# Patient Record
Sex: Male | Born: 1955 | Race: Black or African American | Hispanic: No | Smoking: Never smoker
Health system: Southern US, Community
[De-identification: ages and names within clinical notes are randomized; demographics above are authoritative.]

## PROBLEM LIST (undated history)

## (undated) DIAGNOSIS — Z8739 Personal history of other diseases of the musculoskeletal system and connective tissue: Secondary | ICD-10-CM

## (undated) DIAGNOSIS — N451 Epididymitis: Secondary | ICD-10-CM

## (undated) DIAGNOSIS — H9190 Unspecified hearing loss, unspecified ear: Secondary | ICD-10-CM

## (undated) DIAGNOSIS — Z86010 Personal history of colonic polyps: Principal | ICD-10-CM

## (undated) DIAGNOSIS — M109 Gout, unspecified: Secondary | ICD-10-CM

## (undated) DIAGNOSIS — N35919 Unspecified urethral stricture, male, unspecified site: Secondary | ICD-10-CM

## (undated) DIAGNOSIS — N452 Orchitis: Secondary | ICD-10-CM

## (undated) DIAGNOSIS — I10 Essential (primary) hypertension: Secondary | ICD-10-CM

## (undated) DIAGNOSIS — E785 Hyperlipidemia, unspecified: Secondary | ICD-10-CM

## (undated) DIAGNOSIS — K219 Gastro-esophageal reflux disease without esophagitis: Secondary | ICD-10-CM

## (undated) DIAGNOSIS — A419 Sepsis, unspecified organism: Secondary | ICD-10-CM

## (undated) HISTORY — DX: Unspecified hearing loss, unspecified ear: H91.90

## (undated) HISTORY — DX: Orchitis: N45.2

## (undated) HISTORY — DX: Gastro-esophageal reflux disease without esophagitis: K21.9

## (undated) HISTORY — PX: POLYPECTOMY: SHX149

## (undated) HISTORY — DX: Gout, unspecified: M10.9

## (undated) HISTORY — DX: Personal history of colonic polyps: Z86.010

## (undated) HISTORY — PX: WISDOM TOOTH EXTRACTION: SHX21

## (undated) HISTORY — DX: Hyperlipidemia, unspecified: E78.5

## (undated) HISTORY — DX: Unspecified urethral stricture, male, unspecified site: N35.919

## (undated) HISTORY — PX: INGUINAL HERNIA REPAIR: SUR1180

## (undated) HISTORY — PX: COLONOSCOPY: SHX174

## (undated) HISTORY — DX: Epididymitis: N45.1

## (undated) HISTORY — PX: UPPER GASTROINTESTINAL ENDOSCOPY: SHX188

---

## 2004-06-27 ENCOUNTER — Emergency Department (HOSPITAL_COMMUNITY): Admission: EM | Admit: 2004-06-27 | Discharge: 2004-06-27 | Payer: Self-pay | Admitting: Family Medicine

## 2004-07-04 ENCOUNTER — Emergency Department (HOSPITAL_COMMUNITY): Admission: EM | Admit: 2004-07-04 | Discharge: 2004-07-05 | Payer: Self-pay

## 2005-06-17 ENCOUNTER — Emergency Department (HOSPITAL_COMMUNITY): Admission: EM | Admit: 2005-06-17 | Discharge: 2005-06-17 | Payer: Self-pay | Admitting: Family Medicine

## 2005-12-02 ENCOUNTER — Emergency Department (HOSPITAL_COMMUNITY): Admission: EM | Admit: 2005-12-02 | Discharge: 2005-12-02 | Payer: Self-pay | Admitting: Pediatrics

## 2005-12-12 ENCOUNTER — Ambulatory Visit (HOSPITAL_COMMUNITY): Admission: RE | Admit: 2005-12-12 | Discharge: 2005-12-12 | Payer: Self-pay | Admitting: General Surgery

## 2005-12-12 ENCOUNTER — Encounter (INDEPENDENT_AMBULATORY_CARE_PROVIDER_SITE_OTHER): Payer: Self-pay | Admitting: Specialist

## 2007-08-19 ENCOUNTER — Emergency Department (HOSPITAL_COMMUNITY): Admission: EM | Admit: 2007-08-19 | Discharge: 2007-08-20 | Payer: Self-pay | Admitting: Emergency Medicine

## 2009-11-21 ENCOUNTER — Emergency Department (HOSPITAL_COMMUNITY): Admission: EM | Admit: 2009-11-21 | Discharge: 2009-11-21 | Payer: Self-pay | Admitting: Family Medicine

## 2010-04-03 ENCOUNTER — Emergency Department (HOSPITAL_COMMUNITY): Admission: EM | Admit: 2010-04-03 | Discharge: 2010-04-03 | Payer: Self-pay | Admitting: Emergency Medicine

## 2010-05-04 ENCOUNTER — Inpatient Hospital Stay (HOSPITAL_COMMUNITY): Admission: EM | Admit: 2010-05-04 | Discharge: 2010-05-07 | Payer: Self-pay | Admitting: Emergency Medicine

## 2010-05-09 ENCOUNTER — Emergency Department (HOSPITAL_COMMUNITY): Admission: EM | Admit: 2010-05-09 | Discharge: 2010-05-09 | Payer: Self-pay | Admitting: Emergency Medicine

## 2010-05-11 ENCOUNTER — Emergency Department (HOSPITAL_COMMUNITY): Admission: EM | Admit: 2010-05-11 | Discharge: 2010-05-11 | Payer: Self-pay | Admitting: Family Medicine

## 2010-06-01 ENCOUNTER — Ambulatory Visit: Payer: Self-pay | Admitting: Internal Medicine

## 2010-07-10 ENCOUNTER — Emergency Department (HOSPITAL_COMMUNITY): Admission: EM | Admit: 2010-07-10 | Discharge: 2010-07-10 | Payer: Self-pay | Admitting: Family Medicine

## 2010-10-24 NOTE — Assessment & Plan Note (Signed)
Summary: BRAND NEW PT/TO EST/CJR   Vital Signs:  Patient profile:   55 year old male Height:      69 inches Weight:      218 pounds BMI:     32.31 Temp:     98.2 degrees F oral Pulse rate:   78 / minute Pulse rhythm:   regular Resp:     18 per minute BP sitting:   120 / 78  (right arm) Cuff size:   regular  Vitals Entered By: Duard Brady LPN (June 01, 2010 9:14 AM) CC: new to establish Is Patient Diabetic? No   CC:  new to establish.  History of Present Illness: 55 year old patient who is seen today to establish  with our practice.  he was hospitalized last month for urosepsis following a urological procedure.  It sounds like he had a urethral stricture, dilated.  Since his hospital discharge.  There has been some concern about hypertension.  His RN  wife has been tracking.  Blood pressures, which are occasionally in the stage II, blood pressure range.  He is very sedentary  Preventive Screening-Counseling & Management  Alcohol-Tobacco     Smoking Status: never  Caffeine-Diet-Exercise     Does Patient Exercise: no  Allergies (verified): No Known Drug Allergies  Past History:  Past Medical History: hypertension.  Suspect history of urosepsis history of urethral stricture  Past Surgical History: right angle, hernia repair 2005 status post dilatation of a urethral stricture August 2011  Family History: Reviewed history and no changes required. father died age 24, complications of a wound infection related to a gunshot wound mother, age 81, has a history of cardiac disease and is status post pacemaker insertion 4 brothers, one with a history of hypertension 4 sisters, one with a history of cancer, unclear type  Social History: Reviewed history and no changes required. Occupation:  truck [employer] doctor Married Never Smoked Regular exercise-no Smoking Status:  never Does Patient Exercise:  no  Review of Systems  The patient denies anorexia, fever,  weight loss, weight gain, vision loss, decreased hearing, hoarseness, chest pain, syncope, dyspnea on exertion, peripheral edema, prolonged cough, headaches, hemoptysis, abdominal pain, melena, hematochezia, severe indigestion/heartburn, hematuria, incontinence, genital sores, muscle weakness, suspicious skin lesions, transient blindness, difficulty walking, depression, unusual weight change, abnormal bleeding, enlarged lymph nodes, angioedema, breast masses, and testicular masses.    Physical Exam  General:  overweight-appearing.  120/84 Head:  Normocephalic and atraumatic without obvious abnormalities. No apparent alopecia or balding. Eyes:  No corneal or conjunctival inflammation noted. EOMI. Perrla. Funduscopic exam benign, without hemorrhages, exudates or papilledema. Vision grossly normal. Ears:  External ear exam shows no significant lesions or deformities.  Otoscopic examination reveals clear canals, tympanic membranes are intact bilaterally without bulging, retraction, inflammation or discharge. Hearing is grossly normal bilaterally. Nose:  External nasal examination shows no deformity or inflammation. Nasal mucosa are pink and moist without lesions or exudates. Mouth:  Oral mucosa and oropharynx without lesions or exudates.  Teeth in good repair. Neck:  No deformities, masses, or tenderness noted. Chest Wall:  No deformities, masses, tenderness or gynecomastia noted. Breasts:  No masses or gynecomastia noted Lungs:  Normal respiratory effort, chest expands symmetrically. Lungs are clear to auscultation, no crackles or wheezes. Heart:  Normal rate and regular rhythm. S1 and S2 normal without gallop, murmur, click, rub or other extra sounds. Abdomen:  Bowel sounds positive,abdomen soft and non-tender without masses, organomegaly or hernias noted. Genitalia:  Testes bilaterally descended without  nodularity, tenderness or masses. No scrotal masses or lesions. No penis lesions or urethral  discharge. Msk:  No deformity or scoliosis noted of thoracic or lumbar spine.   Pulses:  R and L carotid,radial,femoral,dorsalis pedis and posterior tibial pulses are full and equal bilaterally Extremities:  No clubbing, cyanosis, edema, or deformity noted with normal full range of motion of all joints.   Neurologic:  alert & oriented X3, cranial nerves II-XII intact, and gait normal.   Skin:  Intact without suspicious lesions or rashes Cervical Nodes:  No lymphadenopathy noted Axillary Nodes:  No palpable lymphadenopathy Inguinal Nodes:  No significant adenopathy Psych:  Cognition and judgment appear intact. Alert and cooperative with normal attention span and concentration. No apparent delusions, illusions, hallucinations   Impression & Recommendations:  Problem # 1:  Preventive Health Care (ICD-V70.0)  Problem # 2:  ELEVATED BLOOD PRESSURE WITHOUT DIAGNOSIS OF HYPERTENSION (ICD-796.2)  Patient Instructions: 1)  Please schedule a follow-up appointment in 1 month. 2)  Limit your Sodium (Salt). 3)  It is important that you exercise regularly at least 20 minutes 5 times a week. If you develop chest pain, have severe difficulty breathing, or feel very tired , stop exercising immediately and seek medical attention. 4)  You need to lose weight. Consider a lower calorie diet and regular exercise.  5)  Check your Blood Pressure regularly. If it is above: 150/90, you should make an appointment.

## 2010-12-07 LAB — URINALYSIS, ROUTINE W REFLEX MICROSCOPIC
Bilirubin Urine: NEGATIVE
Bilirubin Urine: NEGATIVE
Glucose, UA: NEGATIVE mg/dL
Glucose, UA: NEGATIVE mg/dL
Ketones, ur: NEGATIVE mg/dL
Ketones, ur: NEGATIVE mg/dL
Nitrite: NEGATIVE
Nitrite: NEGATIVE
Protein, ur: NEGATIVE mg/dL
Protein, ur: NEGATIVE mg/dL
Specific Gravity, Urine: 1.017 (ref 1.005–1.030)
Specific Gravity, Urine: 1.018 (ref 1.005–1.030)
Urobilinogen, UA: 1 mg/dL (ref 0.0–1.0)
Urobilinogen, UA: 1 mg/dL (ref 0.0–1.0)
pH: 6.5 (ref 5.0–8.0)
pH: 6.5 (ref 5.0–8.0)

## 2010-12-07 LAB — CBC
HCT: 35.5 % — ABNORMAL LOW (ref 39.0–52.0)
HCT: 37.3 % — ABNORMAL LOW (ref 39.0–52.0)
HCT: 37.9 % — ABNORMAL LOW (ref 39.0–52.0)
HCT: 37.9 % — ABNORMAL LOW (ref 39.0–52.0)
HCT: 40.1 % (ref 39.0–52.0)
Hemoglobin: 12.1 g/dL — ABNORMAL LOW (ref 13.0–17.0)
Hemoglobin: 12.7 g/dL — ABNORMAL LOW (ref 13.0–17.0)
Hemoglobin: 12.8 g/dL — ABNORMAL LOW (ref 13.0–17.0)
Hemoglobin: 13.1 g/dL (ref 13.0–17.0)
Hemoglobin: 13.7 g/dL (ref 13.0–17.0)
MCH: 28.6 pg (ref 26.0–34.0)
MCH: 28.9 pg (ref 26.0–34.0)
MCH: 29 pg (ref 26.0–34.0)
MCH: 29.2 pg (ref 26.0–34.0)
MCHC: 33.7 g/dL (ref 30.0–36.0)
MCHC: 34.2 g/dL (ref 30.0–36.0)
MCHC: 34.3 g/dL (ref 30.0–36.0)
MCHC: 34.3 g/dL (ref 30.0–36.0)
MCHC: 34.7 g/dL (ref 30.0–36.0)
MCV: 84.2 fL (ref 78.0–100.0)
MCV: 84.4 fL (ref 78.0–100.0)
MCV: 84.7 fL (ref 78.0–100.0)
MCV: 85 fL (ref 78.0–100.0)
Platelets: 100 10*3/uL — ABNORMAL LOW (ref 150–400)
Platelets: 102 10*3/uL — ABNORMAL LOW (ref 150–400)
Platelets: 108 10*3/uL — ABNORMAL LOW (ref 150–400)
Platelets: 93 10*3/uL — ABNORMAL LOW (ref 150–400)
RBC: 4.2 MIL/uL — ABNORMAL LOW (ref 4.22–5.81)
RBC: 4.42 MIL/uL (ref 4.22–5.81)
RBC: 4.46 MIL/uL (ref 4.22–5.81)
RBC: 4.5 MIL/uL (ref 4.22–5.81)
RBC: 4.73 MIL/uL (ref 4.22–5.81)
RDW: 14.1 % (ref 11.5–15.5)
RDW: 14.2 % (ref 11.5–15.5)
RDW: 14.4 % (ref 11.5–15.5)
RDW: 14.5 % (ref 11.5–15.5)
WBC: 2.9 10*3/uL — ABNORMAL LOW (ref 4.0–10.5)
WBC: 5.4 10*3/uL (ref 4.0–10.5)
WBC: 7.5 10*3/uL (ref 4.0–10.5)
WBC: 9 10*3/uL (ref 4.0–10.5)

## 2010-12-07 LAB — BASIC METABOLIC PANEL
BUN: 13 mg/dL (ref 6–23)
CO2: 23 mEq/L (ref 19–32)
Calcium: 8.5 mg/dL (ref 8.4–10.5)
Chloride: 108 mEq/L (ref 96–112)
Creatinine, Ser: 1.39 mg/dL (ref 0.4–1.5)
GFR calc Af Amer: 59 mL/min — ABNORMAL LOW (ref >60)
GFR calc Af Amer: >60 mL/min (ref >60)
GFR calc non Af Amer: 48 mL/min — ABNORMAL LOW (ref >60)
GFR calc non Af Amer: 54 mL/min — ABNORMAL LOW (ref >60)
Glucose, Bld: 114 mg/dL — ABNORMAL HIGH (ref 70–99)
Glucose, Bld: 95 mg/dL (ref 70–99)
Potassium: 3.6 mEq/L (ref 3.5–5.1)
Potassium: 3.8 mEq/L (ref 3.5–5.1)
Sodium: 136 mEq/L (ref 135–145)
Sodium: 139 mEq/L (ref 135–145)

## 2010-12-07 LAB — COMPREHENSIVE METABOLIC PANEL
ALT: 36 U/L (ref 0–53)
ALT: 42 U/L (ref 0–53)
ALT: 43 U/L (ref 0–53)
AST: 30 U/L (ref 0–37)
AST: 32 U/L (ref 0–37)
AST: 33 U/L (ref 0–37)
Albumin: 3.2 g/dL — ABNORMAL LOW (ref 3.5–5.2)
Albumin: 3.5 g/dL (ref 3.5–5.2)
Albumin: 3.6 g/dL (ref 3.5–5.2)
Alkaline Phosphatase: 27 U/L — ABNORMAL LOW (ref 39–117)
Alkaline Phosphatase: 29 U/L — ABNORMAL LOW (ref 39–117)
Alkaline Phosphatase: 30 U/L — ABNORMAL LOW (ref 39–117)
BUN: 12 mg/dL (ref 6–23)
BUN: 13 mg/dL (ref 6–23)
BUN: 7 mg/dL (ref 6–23)
CO2: 25 mEq/L (ref 19–32)
CO2: 26 mEq/L (ref 19–32)
CO2: 27 mEq/L (ref 19–32)
Calcium: 8.2 mg/dL — ABNORMAL LOW (ref 8.4–10.5)
Calcium: 8.5 mg/dL (ref 8.4–10.5)
Calcium: 8.5 mg/dL (ref 8.4–10.5)
Chloride: 105 mEq/L (ref 96–112)
Chloride: 106 mEq/L (ref 96–112)
Chloride: 109 mEq/L (ref 96–112)
Creatinine, Ser: 1.38 mg/dL (ref 0.4–1.5)
Creatinine, Ser: 1.46 mg/dL (ref 0.4–1.5)
Creatinine, Ser: 1.53 mg/dL — ABNORMAL HIGH (ref 0.4–1.5)
GFR calc Af Amer: 58 mL/min — ABNORMAL LOW (ref >60)
GFR calc Af Amer: >60 mL/min (ref >60)
GFR calc Af Amer: >60 mL/min (ref >60)
GFR calc non Af Amer: 48 mL/min — ABNORMAL LOW (ref >60)
GFR calc non Af Amer: 51 mL/min — ABNORMAL LOW (ref >60)
GFR calc non Af Amer: 54 mL/min — ABNORMAL LOW (ref >60)
Glucose, Bld: 105 mg/dL — ABNORMAL HIGH (ref 70–99)
Glucose, Bld: 86 mg/dL (ref 70–99)
Glucose, Bld: 94 mg/dL (ref 70–99)
Potassium: 3.6 mEq/L (ref 3.5–5.1)
Potassium: 4 mEq/L (ref 3.5–5.1)
Potassium: 4.1 mEq/L (ref 3.5–5.1)
Sodium: 138 mEq/L (ref 135–145)
Sodium: 139 mEq/L (ref 135–145)
Sodium: 139 mEq/L (ref 135–145)
Total Bilirubin: 0.6 mg/dL (ref 0.3–1.2)
Total Bilirubin: 0.8 mg/dL (ref 0.3–1.2)
Total Bilirubin: 1.1 mg/dL (ref 0.3–1.2)
Total Protein: 6.7 g/dL (ref 6.0–8.3)
Total Protein: 6.8 g/dL (ref 6.0–8.3)
Total Protein: 7 g/dL (ref 6.0–8.3)

## 2010-12-07 LAB — URINE CULTURE
Colony Count: 50000
Culture  Setup Time: 201108130122
Culture: NO GROWTH
Special Requests: NEGATIVE

## 2010-12-07 LAB — URINE MICROSCOPIC-ADD ON

## 2010-12-07 LAB — DIFFERENTIAL
Basophils Absolute: 0 10*3/uL (ref 0.0–0.1)
Basophils Absolute: 0 10*3/uL (ref 0.0–0.1)
Basophils Relative: 0 % (ref 0–1)
Basophils Relative: 0 % (ref 0–1)
Eosinophils Absolute: 0 10*3/uL (ref 0.0–0.7)
Eosinophils Absolute: 0 10*3/uL (ref 0.0–0.7)
Eosinophils Relative: 0 % (ref 0–5)
Eosinophils Relative: 1 % (ref 0–5)
Lymphocytes Relative: 16 % (ref 12–46)
Lymphocytes Relative: 5 % — ABNORMAL LOW (ref 12–46)
Lymphs Abs: 0.4 10*3/uL — ABNORMAL LOW (ref 0.7–4.0)
Lymphs Abs: 0.9 10*3/uL (ref 0.7–4.0)
Monocytes Absolute: 0.1 10*3/uL (ref 0.1–1.0)
Monocytes Absolute: 0.2 10*3/uL (ref 0.1–1.0)
Monocytes Relative: 1 % — ABNORMAL LOW (ref 3–12)
Monocytes Relative: 2 % — ABNORMAL LOW (ref 3–12)
Neutro Abs: 4.4 10*3/uL (ref 1.7–7.7)
Neutro Abs: 8.4 10*3/uL — ABNORMAL HIGH (ref 1.7–7.7)
Neutrophils Relative %: 81 % — ABNORMAL HIGH (ref 43–77)
Neutrophils Relative %: 93 % — ABNORMAL HIGH (ref 43–77)

## 2010-12-07 LAB — CULTURE, BLOOD (ROUTINE X 2)
Culture: NO GROWTH
Culture: NO GROWTH
Culture: NO GROWTH
Culture: NO GROWTH

## 2010-12-07 LAB — NA AND K (SODIUM & POTASSIUM), RAND UR: Potassium Urine: 10 mEq/L

## 2010-12-07 LAB — LACTATE DEHYDROGENASE: LDH: 168 U/L (ref 94–250)

## 2010-12-09 LAB — URINALYSIS, ROUTINE W REFLEX MICROSCOPIC
Bilirubin Urine: NEGATIVE
Nitrite: POSITIVE — AB
Specific Gravity, Urine: 1.023 (ref 1.005–1.030)
Urobilinogen, UA: 0.2 mg/dL (ref 0.0–1.0)
pH: 6 (ref 5.0–8.0)

## 2010-12-09 LAB — URINE MICROSCOPIC-ADD ON

## 2010-12-09 LAB — URINE CULTURE

## 2010-12-16 LAB — URINE CULTURE

## 2010-12-16 LAB — POCT URINALYSIS DIP (DEVICE)
Bilirubin Urine: NEGATIVE
Glucose, UA: NEGATIVE mg/dL
Ketones, ur: NEGATIVE mg/dL
Specific Gravity, Urine: 1.02 (ref 1.005–1.030)

## 2011-02-08 NOTE — Op Note (Signed)
[name], [name]                ACCOUNT NO.:  [id number]   MEDICAL RECORD NO.:  [id number]          PATIENT TYPE:  AMB   LOCATION:  SDS                          FACILITY:  [facility]   PHYSICIAN:  Leonie Man, M.D.   DATE OF BIRTH:  July 01, 1956   DATE OF PROCEDURE:  12/12/2005  DATE OF DISCHARGE:  12/12/2005                                 OPERATIVE REPORT   PREOPERATIVE DIAGNOSES:  1.  Right inguinal hernia.  2.  Lipoma, right groin.   POSTOPERATIVE DIAGNOSES:  1.  Right inguinal hernia.  2.  Lipoma, right groin.   PROCEDURES:  1.  Repair of right inguinal hernia with mesh.  2.  Excision of lipoma, right groin.   SURGEON:  Leonie Man, M.D.   ASSISTANT:  R.N.F.A.   ANESTHESIA:  General.   NOTE:  The patient is a 55 year old man presenting with right-sided groin  pain and a right-sided groin mass.  He on examination noted to have what  feels like a lipoma in the most proximal part of his scrotum and groin as  well as a hernia protruding through the external ring.  He comes to the  operating room after the risks and potential benefits of surgery have been  discussed, all questions answered and consent obtained.   PROCEDURE:  Following induction of satisfactory general anesthesia, the  patient positioned supinely, the abdomen is prepped and draped to be  included in a sterile operative field.  A transverse incision was made in  the lower abdominal crease, deepened through the skin and subcutaneous  tissues, carrying the dissection down to the external oblique aponeurosis.  External oblique aponeurosis opened up through the external inguinal ring to  reveal rather large direct and indirect inguinal hernias.  The spermatic  cord was elevated and held with a Penrose drain.  The indirect hernia was  primarily that of a large lipoma, which was dissected free from the  spermatic cor, carrying the dissection up to the internal ring.  The  attachments of the lipoma to the  retroperitoneal space were then suture  ligated with  2-0 silk sutures and then the cord lipoma removed and  forwarded for pathologic evaluation.  On evaluation of the direct space,  there was also a direct hernia protruding through the transversalis fascia,  right at the internal ring.  This was then mobilized and the superior  epigastric vessels held with a with an Allis clamp and dissection carried  down into the retroperitoneum to make space for the Prolene hernia mesh  system.  The Prolene hernia mesh system was then grasped and deployed into  the retroperitoneal space, covering superiorly, inferiorly, laterally and  medially to cover the entire direct space as well as the femoral triangle.  The external portion of the mesh was then deployed into the inguinal canal  and sutured in at the pubic tubercle and carried up along the conjoined  tendon to the internal ring.  The mesh was split so as to allow protrusion  of the cord through the mesh and then sutured again from the pubic tubercle,  coming along the shelving edge of Poupart's ligament to the internal ring.  The remainder of the portion of the mesh was deployed over the internal  oblique muscles.  The hernia repair was noted to be intact.  The external  oblique aponeurosis was then closed with a running suture of 2-0 Vicryl.  Dissection was then carried down into the lateral aspect of the groin and  scrotum to deliver a very large, approximately 8 cm lipoma from the soft  tissues of the groin.  This was dissected free, removed and forwarded for  pathologic evaluation.  Hemostasis obtained with electrocautery and the  defect closed with 3-0 Vicryl sutures.  Subcutaneous tissue then closed with  3-0 Vicryl sutures.  Skin closed with a 4-0 Monocryl suture and then  reinforced with Steri-Strips.  Sterile dressings applied, the anesthetic  reversed and the patient removed from the operating room to the recovery  room in stable  condition.  He tolerated the procedure well.      Leonie Man, M.D.  Electronically Signed     PB/MEDQ  D:  12/12/2005  T:  12/14/2005  Job:  [id number]

## 2011-07-02 LAB — URINALYSIS, ROUTINE W REFLEX MICROSCOPIC
Bilirubin Urine: NEGATIVE
Ketones, ur: NEGATIVE
Nitrite: NEGATIVE
Protein, ur: NEGATIVE
pH: 7

## 2011-07-02 LAB — URINE MICROSCOPIC-ADD ON

## 2011-08-03 ENCOUNTER — Encounter: Payer: Self-pay | Admitting: *Deleted

## 2011-08-03 ENCOUNTER — Emergency Department (HOSPITAL_COMMUNITY)
Admission: EM | Admit: 2011-08-03 | Discharge: 2011-08-04 | Disposition: A | Discharge status: Home / Self Care | Payer: Self-pay | Attending: Emergency Medicine | Admitting: Emergency Medicine

## 2011-08-03 DIAGNOSIS — L539 Erythematous condition, unspecified: Secondary | ICD-10-CM | POA: Insufficient documentation

## 2011-08-03 DIAGNOSIS — M79609 Pain in unspecified limb: Secondary | ICD-10-CM | POA: Insufficient documentation

## 2011-08-03 DIAGNOSIS — M109 Gout, unspecified: Secondary | ICD-10-CM | POA: Insufficient documentation

## 2011-08-03 MED ORDER — PREDNISONE 50 MG PO TABS: ORAL_TABLET | ORAL | Status: AC

## 2011-08-03 MED ORDER — PREDNISONE 20 MG PO TABS
60.0000 mg | ORAL_TABLET | Freq: Once | ORAL | Status: AC
  Administered 2011-08-04: 60 mg via ORAL
  Filled 2011-08-03: qty 3

## 2011-08-03 MED ORDER — IBUPROFEN 800 MG PO TABS: 800.0000 mg | ORAL_TABLET | Freq: Three times a day (TID) | ORAL | Status: AC

## 2011-08-03 MED ORDER — IBUPROFEN 800 MG PO TABS
800.0000 mg | ORAL_TABLET | Freq: Once | ORAL | Status: AC
  Administered 2011-08-04: 800 mg via ORAL
  Filled 2011-08-03: qty 1

## 2011-08-03 MED ORDER — OXYCODONE-ACETAMINOPHEN 5-325 MG PO TABS: 2.0000 | ORAL_TABLET | ORAL | Status: AC | PRN

## 2011-08-03 NOTE — ED Notes (Signed)
Pt in c/o left toe pain, pt states he injured his toe 8 months ago, over last few days noted increased pain and swelling

## 2011-08-03 NOTE — ED Provider Notes (Signed)
History     CSN: [id number] Arrival date & time: 08/03/2011  9:51 PM   First MD Initiated Contact with Patient 08/03/11 2303      Chief Complaint  Patient presents with  . Toe Pain    (Consider location/radiation/quality/duration/timing/severity/associated sxs/prior treatment) HPI  Patient states he kicked a large object approximately 7 months ago causing pain in left foot with waxing and waning in foot since then who has been seen emergency room and urgent care for complaints of intermittent left great toe pain returns to emergency department tonight complaining of gradual onset left great toe pain in the metatarsal joint that began yesterday and has gradually worsened throughout the day today with associated faint joint redness and swelling and severe tenderness to palpation. Patient states pain is aggravated by ambulation. Patient has not taken anything at home prior to arrival for pain. Patient states despite receiving referrals to primary care has been unable to followup with primary care due to insurance status but states he should be getting new insurance with his current job very soon. Patient denies known history of gout. Denies new injury the foot. Denies radiation of pain outside of left great toe. Denies breaks in skin.  History reviewed. No pertinent past medical history.  History reviewed. No pertinent past surgical history.  History reviewed. No pertinent family history.  History  Substance Use Topics  . Smoking status: Never Smoker   . Smokeless tobacco: Not on file  . Alcohol Use: No      Review of Systems  All other systems reviewed and are negative.    Allergies  Review of patient's allergies indicates no known allergies.  Home Medications  No current outpatient prescriptions on file.  BP 135/89  Pulse 82  Temp(Src) 98 F (36.7 C) (Oral)  Resp 20  SpO2 100%  Physical Exam  Nursing note and vitals reviewed. Constitutional: He is oriented to  person, place, and time. He appears well-developed and well-nourished. No distress.  HENT:  Head: Normocephalic and atraumatic.  Eyes: Conjunctivae are normal.  Neck: Neck supple.  Cardiovascular: Normal rate.   Pulmonary/Chest: Effort normal.  Musculoskeletal:       Faint erythema of left great metatarsal joint with moderate tenderness to palpation but no heat. No break in skin. No tenderness to palpation of the remainder of the forefoot or toes. Good pedal pulse and Refill. Normal sensation.  Neurological: He is alert and oriented to person, place, and time.  Skin: Skin is warm and dry. He is not diaphoretic. There is erythema.  Psychiatric: He has a normal mood and affect. His behavior is normal.    ED Course  Procedures (including critical care time)  Labs Reviewed - No data to display No results found.   1. Gout       MDM  Patient with 7 months of on and off pain since injury of left foot with recent x-ray in urgent care on October 18 showing no acute findings the left but with no new injury since then. Faint erythema of left great metatarsal joint consistent with possible gallop. Will treat patient with anti-inflammatories and prednisone. No signs or symptoms of cellulitis or septic joint        Jenness Corner, PA 08/03/11 2357

## 2011-08-04 NOTE — ED Provider Notes (Signed)
Medical screening examination/treatment/procedure(s) were performed by non-physician practitioner and as supervising physician I was immediately available for consultation/collaboration.  Hurman Horn, MD 08/04/11 [phone]

## 2012-02-21 ENCOUNTER — Encounter (HOSPITAL_COMMUNITY): Payer: Self-pay | Admitting: Emergency Medicine

## 2012-02-21 ENCOUNTER — Emergency Department (HOSPITAL_COMMUNITY)
Admission: EM | Admit: 2012-02-21 | Discharge: 2012-02-21 | Disposition: A | Discharge status: Home / Self Care | Payer: PRIVATE HEALTH INSURANCE | Attending: Emergency Medicine | Admitting: Emergency Medicine

## 2012-02-21 ENCOUNTER — Emergency Department (HOSPITAL_COMMUNITY): Payer: PRIVATE HEALTH INSURANCE

## 2012-02-21 DIAGNOSIS — M1990 Unspecified osteoarthritis, unspecified site: Secondary | ICD-10-CM

## 2012-02-21 DIAGNOSIS — M25569 Pain in unspecified knee: Secondary | ICD-10-CM | POA: Insufficient documentation

## 2012-02-21 DIAGNOSIS — M129 Arthropathy, unspecified: Secondary | ICD-10-CM | POA: Insufficient documentation

## 2012-02-21 MED ORDER — MELOXICAM 7.5 MG PO TABS: 7.5000 mg | ORAL_TABLET | Freq: Every day | ORAL | Status: DC

## 2012-02-21 MED ORDER — HYDROCODONE-ACETAMINOPHEN 5-500 MG PO TABS: 1.0000 | ORAL_TABLET | Freq: Four times a day (QID) | ORAL | Status: AC | PRN

## 2012-02-21 NOTE — ED Notes (Signed)
Patient reports that he has an achiness to his left knee x 2 weeks. The patient denies any trauma

## 2012-02-21 NOTE — Discharge Instructions (Signed)

## 2012-02-21 NOTE — ED Provider Notes (Signed)
History     CSN: [id number]  Arrival date & time 02/21/12  1756   None     Chief Complaint  Patient presents with  . Knee Pain    LT side x 2 weeks.  Increased pain w/ambulation    (Consider location/radiation/quality/duration/timing/severity/associated sxs/prior treatment) HPI  Patient presents to the ED with complaints of pain and swelling to the left knee. Denies any trauma he states that this has happened before when the weather changes. He states that work is [place] is swollen and painful and achy. He denies having fevers, chills, nausea, diarrhea, vomiting. He states he has had gout in his toe before but this does not feel like the same kind of pain. He states that his fingers sometimes get achy as well. He denies any change in vision, change in his urinary function. Patient in no acute distress at this time the vital signs are stable.  History reviewed. No pertinent past medical history.  Past Surgical History  Procedure Date  . Hernia repair     History reviewed. No pertinent family history.  History  Substance Use Topics  . Smoking status: Never Smoker   . Smokeless tobacco: Not on file  . Alcohol Use: No      Review of Systems  Allergies  Review of patient's allergies indicates no known allergies.  Home Medications   Current Outpatient Rx  Name Route Sig Dispense Refill  . HYDROCODONE-ACETAMINOPHEN 5-500 MG PO TABS Oral Take 1 tablet by mouth every 6 (six) hours as needed for pain. 7 tablet 0  . MELOXICAM 7.5 MG PO TABS Oral Take 1 tablet (7.5 mg total) by mouth daily. 14 tablet 0    BP 126/84  Pulse 93  Temp(Src) 98.7 F (37.1 C) (Oral)  Resp 16  SpO2 100%  Physical Exam  Nursing note and vitals reviewed. Constitutional: He is oriented to person, place, and time. He appears well-developed and well-nourished. No distress.  HENT:  Head: Normocephalic and atraumatic.  Nose: Nose normal.  Mouth/Throat: Uvula is midline and mucous membranes are  normal.  Eyes: Conjunctivae are normal.  Neck: Trachea normal and full passive range of motion without pain.       .  Cardiovascular: Normal rate and regular rhythm.   Pulmonary/Chest: Effort normal and breath sounds normal. No respiratory distress.  Abdominal: Soft. There is no tenderness.  Musculoskeletal:       Left knee: He exhibits decreased range of motion and effusion. He exhibits no swelling, no ecchymosis, no deformity, no laceration and no erythema. tenderness found. Medial joint line and lateral joint line tenderness noted.  Neurological: He is alert and oriented to person, place, and time.  Skin: Skin is warm and dry. He is not diaphoretic.  Psychiatric: He has a normal mood and affect.    ED Course  Procedures (including critical care time)  Labs Reviewed - No data to display Dg Knee Complete 4 Views Left  02/21/2012  *RADIOLOGY REPORT*  Clinical Data: Knee pain and swelling.  LEFT KNEE - COMPLETE 4+ VIEW  Comparison: None.  Findings: There is a small joint effusion.  No fracture or dislocation.  Mild degenerative change is present with a small osteophyte seen off the superior pole of the patella.  IMPRESSION:  1.  No acute finding. 2.  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  Original Report Authenticated By: Bernadene Bell. D'ALESSIO, M.D.     1. Arthritis       MDM  Pt  given knee sleeve in ED as well as an Rx for Mobic for 14 days and Vicodin 7 tabs. Patient has been asked to see his PCP for further treatment of his arthritic pain.   Other etiologies of pain that were considered are gout and septic arthritis, the patients story and exam are not consistent with these diagnosis.        Dorthula Matas, PA 02/21/12 1925  Dorthula Matas, PA 02/21/12 1926

## 2012-02-22 NOTE — ED Provider Notes (Signed)
Medical screening examination/treatment/procedure(s) were performed by non-physician practitioner and as supervising physician I was immediately available for consultation/collaboration.   Criag Wicklund, MD 02/22/12 2321 

## 2012-11-03 ENCOUNTER — Ambulatory Visit (INDEPENDENT_AMBULATORY_CARE_PROVIDER_SITE_OTHER): Payer: BC Managed Care – PPO | Admitting: Internal Medicine

## 2012-11-03 ENCOUNTER — Encounter: Payer: Self-pay | Admitting: Internal Medicine

## 2012-11-03 VITALS — BP 160/100 | HR 76 | Temp 98.1°F | Resp 18 | Wt 230.0 lb

## 2012-11-03 DIAGNOSIS — M549 Dorsalgia, unspecified: Secondary | ICD-10-CM

## 2012-11-03 DIAGNOSIS — I10 Essential (primary) hypertension: Secondary | ICD-10-CM | POA: Insufficient documentation

## 2012-11-03 DIAGNOSIS — L910 Hypertrophic scar: Secondary | ICD-10-CM

## 2012-11-03 MED ORDER — LISINOPRIL-HYDROCHLOROTHIAZIDE 20-25 MG PO TABS: 1.0000 | ORAL_TABLET | Freq: Every day | ORAL | Status: DC

## 2012-11-03 MED ORDER — HYDROCODONE-ACETAMINOPHEN 5-300 MG PO TABS: ORAL_TABLET | ORAL | Status: DC

## 2012-11-03 NOTE — Patient Instructions (Signed)
Limit your sodium (Salt) intake    It is important that you exercise regularly, at least 20 minutes 3 to 4 times per week.  If you develop chest pain or shortness of breath seek  medical attention.  You need to lose weight.  Consider a lower calorie diet and regular exercise.DASH Diet The DASH diet stands for "Dietary Approaches to Stop Hypertension." It is a healthy eating plan that has been shown to reduce high blood pressure (hypertension) in as little as 14 days, while also possibly providing other significant health benefits. These other health benefits include reducing the risk of breast cancer after menopause and reducing the risk of type 2 diabetes, heart disease, colon cancer, and stroke. Health benefits also include weight loss and slowing kidney failure in patients with chronic kidney disease.  DIET GUIDELINES  Limit salt (sodium). Your diet should contain less than 1500 mg of sodium daily.  Limit refined or processed carbohydrates. Your diet should include mostly whole grains. Desserts and added sugars should be used sparingly.  Include small amounts of heart-healthy fats. These types of fats include nuts, oils, and tub margarine. Limit saturated and trans fats. These fats have been shown to be harmful in the body. CHOOSING FOODS  The following food groups are based on a 2000 calorie diet. See your Registered Dietitian for individual calorie needs. Grains and Grain Products (6 to 8 servings daily)  Eat More Often: Whole-wheat bread, brown rice, whole-grain or wheat pasta, quinoa, popcorn without added fat or salt (air popped).  Eat Less Often: White bread, white pasta, white rice, cornbread. Vegetables (4 to 5 servings daily)  Eat More Often: Fresh, frozen, and canned vegetables. Vegetables may be raw, steamed, roasted, or grilled with a minimal amount of fat.  Eat Less Often/Avoid: Creamed or fried vegetables. Vegetables in a cheese sauce. Fruit (4 to 5 servings daily)  Eat  More Often: All fresh, canned (in natural juice), or frozen fruits. Dried fruits without added sugar. One hundred percent fruit juice ( cup [237 mL] daily).  Eat Less Often: Dried fruits with added sugar. Canned fruit in light or heavy syrup. [employer], Fish, and Poultry (2 servings or less daily. One serving is 3 to 4 oz [85-114 g]).  Eat More Often: Ninety percent or leaner ground beef, tenderloin, sirloin. Round cuts of beef, chicken breast, [place] breast. All fish. Grill, bake, or broil your meat. Nothing should be fried.  Eat Less Often/Avoid: Fatty cuts of meat, [place], or chicken leg, thigh, or wing. Fried cuts of meat or fish. Dairy (2 to 3 servings)  Eat More Often: Low-fat or fat-free milk, low-fat plain or light yogurt, reduced-fat or part-skim cheese.  Eat Less Often/Avoid: Milk (whole, 2%).Whole milk yogurt. Full-fat cheeses. Nuts, Seeds, and Legumes (4 to 5 servings per week)  Eat More Often: All without added salt.  Eat Less Often/Avoid: Salted nuts and seeds, canned beans with added salt. Fats and Sweets (limited)  Eat More Often: Vegetable oils, tub margarines without trans fats, sugar-free gelatin. Mayonnaise and salad dressings.  Eat Less Often/Avoid: Coconut oils, palm oils, butter, stick margarine, cream, half and half, cookies, candy, pie. FOR MORE INFORMATION The Dash Diet Eating Plan: www.dashdiet.org Document Released: 08/29/2011 Document Revised: 12/02/2011 Document Reviewed: 08/29/2011 [facility] Patient Information 2013 [place], [place]. Back Injury Prevention The following tips can help you to prevent a back injury. PHYSICAL FITNESS  Exercise often. Try to develop strong stomach (abdominal) muscles.  Do aerobic exercises often. This includes walking, jogging,  biking, swimming.  Do exercises that help with balance and strength often. This includes tai chi and yoga.  Stretch before and after you exercise.  Keep a healthy weight. DIET   Ask your  doctor how much calcium and vitamin D you need every day.  Include calcium in your diet. Foods high in calcium include dairy products; green, leafy vegetables; and products with calcium added (fortified).  Include vitamin D in your diet. Foods high in vitamin D include milk and products with vitamin D added.  Think about taking a multivitamin or other nutritional products called " supplements."  Stop smoking if you smoke. POSTURE   Sit and stand up straight. Avoid leaning forward or hunching over.  Choose chairs that support your lower back.  If you work at a desk:  Sit close to your work so you do not lean over.  Keep your chin tucked in.  Keep your neck drawn back.  Keep your elbows bent at a right angle. Your arms should look like the letter "L."  Sit high and close to the steering wheel when you drive. Add low back support to your car seat if needed.  Avoid sitting or standing in one position for too long. Get up and move around every hour. Take breaks if you are driving for a long time.  Sleep on your side with your knees slightly bent. You can also sleep on your back with a pillow under your knees. Do not sleep on your stomach. LIFTING, TWISTING, AND REACHING  Avoid heavy lifting, especially lifting over and over again. If you must do heavy lifting:  Stretch before lifting.  Work slowly.  Rest between lifts.  Use carts and dollies to move objects when possible.  Make several small trips instead of carrying 1 heavy load.  Ask for help when you need it.  Ask for help when moving big, awkward objects.  Follow these steps when lifting:  Stand with your feet shoulder-width apart.  Get as close to the object as you can. Do not pick up heavy objects that are far from your body.  Use handles or lifting straps when possible.  Bend at your knees. Squat down, but keep your heels off the floor.  Keep your shoulders back, your chin tucked in, and your back  straight.  Lift the object slowly. Tighten the muscles in your legs, stomach, and butt. Keep the object as close to the center of your body as possible.  Reverse these directions when you put a load down.  Do not:  Lift the object above your waist.  Twist at the waist while lifting or carrying a load. Move your feet if you need to turn, not your waist.  Bend over without bending at your knees.  Avoid reaching over your head, across a table, or for an object on a high surface. OTHER TIPS  Avoid wet floors and keep sidewalks clear of ice.  Do not sleep on a mattress that is too soft or too hard.  Keep items that you use often within easy reach.  Put heavier objects on shelves at waist level. Put lighter objects on lower or higher shelves.  Find ways to lessen your stress. You can try exercise, massage, or relaxation.  Get help for depression or anxiety if needed. GET HELP IF:  You injure your back.  You have questions about diet, exercise, or other ways to prevent back injuries. MAKE SURE YOU:  Understand these instructions.  Will watch your  condition.  Will get help right away if you are not doing well or get worse. Document Released: 02/26/2008 Document Revised: 12/02/2011 Document Reviewed: 10/21/2011 [facility] Patient Information 2013 [place], [place]. Chronic Back Pain  When back pain lasts longer than 3 months, it is called chronic back pain.People with chronic back pain often go through certain periods that are more intense (flare-ups).  CAUSES Chronic back pain can be caused by wear and tear (degeneration) on different structures in your back. These structures include:  The bones of your spine (vertebrae) and the joints surrounding your spinal cord and nerve roots (facets).  The strong, fibrous tissues that connect your vertebrae (ligaments). Degeneration of these structures may result in pressure on your nerves. This can lead to constant pain. HOME CARE  INSTRUCTIONS  Avoid bending, heavy lifting, prolonged sitting, and activities which make the problem worse.  Take brief periods of rest throughout the day to reduce your pain. Lying down or standing usually is better than sitting while you are resting.  Take over-the-counter or prescription medicines only as directed by your caregiver. SEEK IMMEDIATE MEDICAL CARE IF:   You have weakness or numbness in one of your legs or feet.  You have trouble controlling your bladder or bowels.  You have nausea, vomiting, abdominal pain, shortness of breath, or fainting. Document Released: 10/17/2004 Document Revised: 12/02/2011 Document Reviewed: 08/24/2011 [facility] Patient Information 2013 [place], [place].

## 2012-11-03 NOTE — Progress Notes (Signed)
Subjective:    Patient ID: [name], male    DOB: [birth year], 56 y.o.   MRN: [id number]  HPI  57 year old patient who has been seen previously on one occasion. He presents with a chief complaint of low back pain. This has been intermittent over the years. He states that he has been treated with Vicodin with success in the past. This has not been helped by anti-inflammatory medications He has a history of borderline hypertension but has had no recent blood pressure monitoring. His wife is an Charity fundraiser. Family history is positive for hypertension He also is concerned about a lesion involving his posterior neck. It sounds like he had spontaneous drainage of a sebaceous cyst and now has some residual keloid formation  History reviewed. No pertinent past medical history.  History   Social History  . Marital Status: Legally Separated    Spouse Name: N/A    Number of Children: N/A  . Years of Education: N/A   Occupational History  . Not on file.   Social History Main Topics  . Smoking status: Never Smoker   . Smokeless tobacco: Not on file  . Alcohol Use: No  . Drug Use: No  . Sexually Active:    Other Topics Concern  . Not on file   Social History Narrative  . No narrative on file    Past Surgical History  Procedure Laterality Date  . Hernia repair      No family history on file.  No Known Allergies  Current Outpatient Prescriptions on File Prior to Visit  Medication Sig Dispense Refill  . meloxicam (MOBIC) 7.5 MG tablet Take 1 tablet (7.5 mg total) by mouth daily.  14 tablet  0   No current facility-administered medications on file prior to visit.    BP 160/100  Pulse 76  Temp(Src) 98.1 F (36.7 C) (Oral)  Resp 18  Wt 230 lb (104.327 kg)  BMI 33.95 kg/m2  SpO2 98%       Review of Systems  Constitutional: Negative for fever, chills, appetite change and fatigue.  HENT: Negative for hearing loss, ear pain, congestion, sore throat, trouble swallowing, neck  stiffness, dental problem, voice change and tinnitus.   Eyes: Negative for pain, discharge and visual disturbance.  Respiratory: Negative for cough, chest tightness, wheezing and stridor.   Cardiovascular: Negative for chest pain, palpitations and leg swelling.  Gastrointestinal: Negative for nausea, vomiting, abdominal pain, diarrhea, constipation, blood in stool and abdominal distention.  Genitourinary: Negative for urgency, hematuria, flank pain, discharge, difficulty urinating and genital sores.  Musculoskeletal: Positive for back pain. Negative for myalgias, joint swelling, arthralgias and gait problem.  Skin: Positive for wound. Negative for rash.  Neurological: Negative for dizziness, syncope, speech difficulty, weakness, numbness and headaches.  Hematological: Negative for adenopathy. Does not bruise/bleed easily.  Psychiatric/Behavioral: Negative for behavioral problems and dysphoric mood. The patient is not nervous/anxious.        Objective:   Physical Exam  Constitutional: He is oriented to person, place, and time. He appears well-developed.  Blood pressure 160/110  HENT:  Head: Normocephalic.  Right Ear: External ear normal.  Left Ear: External ear normal.  Eyes: Conjunctivae and EOM are normal.  Neck: Normal range of motion.  Cardiovascular: Normal rate and normal heart sounds.   Pulmonary/Chest: Breath sounds normal.  Abdominal: Bowel sounds are normal.  Musculoskeletal: Normal range of motion. He exhibits no edema and no tenderness.  Neurological: He is alert and oriented to person, place, and  time.  Skin:  Approximate the 1 cm slightly raised pigmented lesion involving the posterior neck area consistent with a keloid  Psychiatric: He has a normal mood and affect. His behavior is normal.          Assessment & Plan:   Acute on chronic low back pain. Will rest continue anti-inflammatories. Prescription for Vicodin dispensed Hypertension. Patient has stage II  hypertension will place on therapy today and reevaluate in 4 weeks Keloid posterior neck. The patient has cosmetic concerns we'll set up to see dermatology

## 2012-12-01 ENCOUNTER — Ambulatory Visit: Payer: BC Managed Care – PPO | Admitting: Internal Medicine

## 2012-12-01 DIAGNOSIS — Z0289 Encounter for other administrative examinations: Secondary | ICD-10-CM

## 2013-01-21 ENCOUNTER — Telehealth: Payer: Self-pay | Admitting: Internal Medicine

## 2013-01-21 NOTE — Telephone Encounter (Signed)
Attempted to return call to pt, message left for call back. °

## 2013-01-22 MED ORDER — HYDROCODONE-ACETAMINOPHEN 5-325 MG PO TABS: 1.0000 | ORAL_TABLET | Freq: Four times a day (QID) | ORAL | Status: DC | PRN

## 2013-01-22 NOTE — Telephone Encounter (Signed)
Spoke to pt asked him how I can help him. Pt stated he is having back pain. Asked pt if he can come in Monday for an appt. 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 Told pt no but can increase the tylenol in the medication but not Hydrocodone will need an appt to discuss pain med. Pt verbalized understanding and stated will call and make a follow up for his back pain and that his blood pressure is doing better. Told pt okay will call in refill for Hydrocodone to pharmacy. Pt verbalized understanding.

## 2013-02-02 ENCOUNTER — Ambulatory Visit: Payer: BC Managed Care – PPO | Admitting: Internal Medicine

## 2013-02-05 ENCOUNTER — Ambulatory Visit (INDEPENDENT_AMBULATORY_CARE_PROVIDER_SITE_OTHER): Payer: BC Managed Care – PPO | Admitting: Internal Medicine

## 2013-02-05 ENCOUNTER — Encounter: Payer: Self-pay | Admitting: Internal Medicine

## 2013-02-05 VITALS — BP 160/100 | HR 60 | Temp 98.0°F | Resp 20 | Wt 226.0 lb

## 2013-02-05 DIAGNOSIS — M549 Dorsalgia, unspecified: Secondary | ICD-10-CM

## 2013-02-05 DIAGNOSIS — I10 Essential (primary) hypertension: Secondary | ICD-10-CM

## 2013-02-05 LAB — CBC WITH DIFFERENTIAL/PLATELET
Basophils Relative: 0.3 % (ref 0.0–3.0)
Eosinophils Relative: 2.2 % (ref 0.0–5.0)
Hemoglobin: 14.3 g/dL (ref 13.0–17.0)
Lymphocytes Relative: 48.8 % — ABNORMAL HIGH (ref 12.0–46.0)
MCV: 83.6 fl (ref 78.0–100.0)
Monocytes Absolute: 0.5 10*3/uL (ref 0.1–1.0)
Neutro Abs: 1.8 10*3/uL (ref 1.4–7.7)
Neutrophils Relative %: 37.8 % — ABNORMAL LOW (ref 43.0–77.0)
RBC: 4.94 Mil/uL (ref 4.22–5.81)
WBC: 4.8 10*3/uL (ref 4.5–10.5)

## 2013-02-05 LAB — COMPREHENSIVE METABOLIC PANEL
Alkaline Phosphatase: 32 U/L — ABNORMAL LOW (ref 39–117)
CO2: 26 mEq/L (ref 19–32)
Creatinine, Ser: 1.2 mg/dL (ref 0.4–1.5)
GFR: 83.04 mL/min (ref >60.00)
Glucose, Bld: 88 mg/dL (ref 70–99)
Sodium: 136 mEq/L (ref 135–145)
Total Bilirubin: 0.7 mg/dL (ref 0.3–1.2)

## 2013-02-05 LAB — LDL CHOLESTEROL, DIRECT: Direct LDL: 141.5 mg/dL

## 2013-02-05 LAB — LIPID PANEL
Cholesterol: 205 mg/dL — ABNORMAL HIGH (ref 0–200)
HDL: 31.5 mg/dL — ABNORMAL LOW (ref >39.00)
Triglycerides: 139 mg/dL (ref 0.0–149.0)

## 2013-02-05 MED ORDER — LOSARTAN POTASSIUM-HCTZ 100-25 MG PO TABS: 1.0000 | ORAL_TABLET | Freq: Every day | ORAL | Status: DC

## 2013-02-05 MED ORDER — HYDROCODONE-ACETAMINOPHEN 7.5-300 MG PO TABS: 1.0000 | ORAL_TABLET | Freq: Four times a day (QID) | ORAL | Status: DC | PRN

## 2013-02-05 NOTE — Progress Notes (Signed)
Subjective:    Patient ID: [name], male    DOB: 1956-09-20, 57 y.o.   MRN: [id number]  HPI  57 year old patient who is seen today for followup. Last visit he was placed on combination lisinopril hydrochlorothiazide for stage II hypertension. His wife who is an Charity fundraiser was tracking blood pressure readings with nice results. Approximately one week ago he discontinued medication do to chronic cough. He has a history chronic low back pain which has been present for greater than 20 years. This is unchanged. This has not responded well to anti-inflammatory medications but he does take hydrocodone 3 or 4 tablets per week No recent lab No lumbar radiographs  Patient does workout at the gym regularly with a active exercise program  Wt Readings from Last 3 Encounters:  02/05/13 226 lb (102.513 kg)  11/03/12 230 lb (104.327 kg)  06/01/10 218 lb (98.884 kg)    History reviewed. No pertinent past medical history.  History   Social History  . Marital Status: Legally Separated    Spouse Name: N/A    Number of Children: N/A  . Years of Education: N/A   Occupational History  . Not on file.   Social History Main Topics  . Smoking status: Never Smoker   . Smokeless tobacco: Not on file  . Alcohol Use: No  . Drug Use: No  . Sexually Active:    Other Topics Concern  . Not on file   Social History Narrative  . No narrative on file    Past Surgical History  Procedure Laterality Date  . Hernia repair      No family history on file.  Allergies  Allergen Reactions  . Lisinopril     Cough    Current Outpatient Prescriptions on File Prior to Visit  Medication Sig Dispense Refill  . ibuprofen (ADVIL,MOTRIN) 200 MG tablet Take 400 mg by mouth every 6 (six) hours as needed for pain.      . Multiple Vitamins-Minerals (MENS 50+ MULTI VITAMIN/MIN PO) Take 1 tablet by mouth daily.       No current facility-administered medications on file prior to visit.    BP 160/100  Pulse 60   Temp(Src) 98 F (36.7 C) (Oral)  Resp 20  Wt 226 lb (102.513 kg)  BMI 33.36 kg/m2  SpO2 98%       Review of Systems  Constitutional: Negative for fever, chills, appetite change and fatigue.  HENT: Negative for hearing loss, ear pain, congestion, sore throat, trouble swallowing, neck stiffness, dental problem, voice change and tinnitus.   Eyes: Negative for pain, discharge and visual disturbance.  Respiratory: Negative for cough, chest tightness, wheezing and stridor.   Cardiovascular: Negative for chest pain, palpitations and leg swelling.  Gastrointestinal: Negative for nausea, vomiting, abdominal pain, diarrhea, constipation, blood in stool and abdominal distention.  Genitourinary: Negative for urgency, hematuria, flank pain, discharge, difficulty urinating and genital sores.  Musculoskeletal: Positive for back pain. Negative for myalgias, joint swelling, arthralgias and gait problem.  Skin: Negative for rash.  Neurological: Negative for dizziness, syncope, speech difficulty, weakness, numbness and headaches.  Hematological: Negative for adenopathy. Does not bruise/bleed easily.  Psychiatric/Behavioral: Negative for behavioral problems and dysphoric mood. The patient is not nervous/anxious.        Objective:   Physical Exam  Constitutional: He is oriented to person, place, and time. He appears well-developed.  Blood pressure 166/106  HENT:  Head: Normocephalic.  Right Ear: External ear normal.  Left Ear: External ear normal.  Eyes: Conjunctivae and EOM are normal.  Neck: Normal range of motion.  Cardiovascular: Normal rate and normal heart sounds.   Pulmonary/Chest: Breath sounds normal.  Abdominal: Bowel sounds are normal.  Musculoskeletal: Normal range of motion. He exhibits no edema and no tenderness.  Neurological: He is alert and oriented to person, place, and time.  Psychiatric: He has a normal mood and affect. His behavior is normal.          Assessment &  Plan:   Hypertension poor control. We'll start Hyzaar 100/25. Home blood pressure monitoring will be continued Chronic low back pain. Vicodin refilled check radiographs  Will check a laboratory update  Recheck 3 months or as needed

## 2013-02-05 NOTE — Patient Instructions (Signed)
Limit your sodium (Salt) intake    It is important that you exercise regularly, at least 20 minutes 3 to 4 times per week.  If you develop chest pain or shortness of breath seek  medical attention.  Please check your blood pressure on a regular basis.  If it is consistently greater than 150/90, please make an office appointment.  

## 2013-03-01 ENCOUNTER — Telehealth: Payer: Self-pay | Admitting: Internal Medicine

## 2013-03-01 NOTE — Telephone Encounter (Signed)
Patient Information:  Caller Name: [name]  Phone: [phone]  Patient: [name], [name]  Gender: Male  DOB: April 03, 1956  Age: 57 Years  PCP: Eleonore Chiquito [facility])  Office Follow Up:  Does the office need to follow up with this patient?: Yes  Instructions For The Office: please call back tomorrow and advise   Symptoms  Reason For Call & Symptoms: Pt is calling to say he had his BP med changed to Losartan at visit from 02/05/13. He had developed a cough on his other BP med. His pressure stayed at 146/95 on the new med. So he restarted the Lisinopril on Sat and his BP went back to 120/80. Pt states the cough restarted today also. So he wants to know if MD would advise another antihypertensive? He is aware this note will not be addressed until tomorrow 03/02/13.  Reviewed Health History In EMR: Yes  Reviewed Medications In EMR: Yes  Reviewed Allergies In EMR: Yes  Reviewed Surgeries / Procedures: Yes  Date of Onset of Symptoms: 03/01/2013  Guideline(s) Used:  No Protocol Available - Information Only  Disposition Per Guideline:   Discuss with PCP and Callback tomorrow  Reason For Disposition Reached:   Nursing judgment  Advice Given:  N/A  RN Overrode Recommendation:  Follow Up With Office Later  Office please call pt back tomorrow regarding this

## 2013-03-02 MED ORDER — AMLODIPINE BESYLATE 5 MG PO TABS: 5.0000 mg | ORAL_TABLET | Freq: Every day | ORAL | Status: DC

## 2013-03-02 NOTE — Telephone Encounter (Signed)
Left detailed message new Rx for Amlodipine 5 mg was sent to pharmacy.

## 2013-03-02 NOTE — Telephone Encounter (Signed)
Please call in a new prescription for amlodipine 5 mg #30 refill x2 one daily

## 2013-03-02 NOTE — Telephone Encounter (Signed)
Pt calling to follow up on request.  Pls call pt.

## 2013-03-11 ENCOUNTER — Encounter: Payer: Self-pay | Admitting: Internal Medicine

## 2013-03-11 ENCOUNTER — Ambulatory Visit (INDEPENDENT_AMBULATORY_CARE_PROVIDER_SITE_OTHER): Payer: BC Managed Care – PPO | Admitting: Internal Medicine

## 2013-03-11 VITALS — BP 160/90 | HR 84 | Temp 98.7°F | Resp 20 | Wt 225.0 lb

## 2013-03-11 DIAGNOSIS — I10 Essential (primary) hypertension: Secondary | ICD-10-CM

## 2013-03-11 NOTE — Patient Instructions (Signed)

## 2013-03-11 NOTE — Progress Notes (Signed)
Subjective:    Patient ID: [name], male    DOB: 03-03-1956, 57 y.o.   MRN: [id number]  HPI   57 year old patient who has a history of hypertension but initially was controlled on lisinopril. Losartan with hydrochlorothiazide was substituted after the patient developed a cough.  Blood pressure was not well-controlled after a few days and he switched back to lisinopril with prompt reduction of his blood pressure but also recurrence of his cough.  He was placed on amlodipine 5 mg daily;  blood pressure has running in the 140/90 range. He feels quite well on this medication  History reviewed. No pertinent past medical history.  History   Social History  . Marital Status: Legally Separated    Spouse Name: N/A    Number of Children: N/A  . Years of Education: N/A   Occupational History  . Not on file.   Social History Main Topics  . Smoking status: Never Smoker   . Smokeless tobacco: Not on file  . Alcohol Use: No  . Drug Use: No  . Sexually Active:    Other Topics Concern  . Not on file   Social History Narrative  . No narrative on file    Past Surgical History  Procedure Laterality Date  . Hernia repair      No family history on file.  Allergies  Allergen Reactions  . Lisinopril     Cough    Current Outpatient Prescriptions on File Prior to Visit  Medication Sig Dispense Refill  . amLODipine (NORVASC) 5 MG tablet Take 1 tablet (5 mg total) by mouth daily.  30 tablet  2  . Hydrocodone-Acetaminophen 7.5-300 MG TABS Take 1 tablet by mouth 4 (four) times daily as needed.  60 each  2  . ibuprofen (ADVIL,MOTRIN) 200 MG tablet Take 400 mg by mouth every 6 (six) hours as needed for pain.      . Multiple Vitamins-Minerals (MENS 50+ MULTI VITAMIN/MIN PO) Take 1 tablet by mouth daily.       No current facility-administered medications on file prior to visit.    BP 160/90  Pulse 84  Temp(Src) 98.7 F (37.1 C) (Oral)  Resp 20  Wt 225 lb (102.059 kg)  BMI 33.21  kg/m2  SpO2 98%       Review of Systems  Constitutional: Negative for fever, chills, appetite change and fatigue.  HENT: Negative for hearing loss, ear pain, congestion, sore throat, trouble swallowing, neck stiffness, dental problem, voice change and tinnitus.   Eyes: Negative for pain, discharge and visual disturbance.  Respiratory: Negative for cough, chest tightness, wheezing and stridor.   Cardiovascular: Negative for chest pain, palpitations and leg swelling.  Gastrointestinal: Negative for nausea, vomiting, abdominal pain, diarrhea, constipation, blood in stool and abdominal distention.  Genitourinary: Negative for urgency, hematuria, flank pain, discharge, difficulty urinating and genital sores.  Musculoskeletal: Negative for myalgias, back pain, joint swelling, arthralgias and gait problem.  Skin: Negative for rash.  Neurological: Negative for dizziness, syncope, speech difficulty, weakness, numbness and headaches.  Hematological: Negative for adenopathy. Does not bruise/bleed easily.  Psychiatric/Behavioral: Negative for behavioral problems and dysphoric mood. The patient is not nervous/anxious.        Objective:   Physical Exam  Constitutional: He appears well-developed and well-nourished. No distress.  Blood pressure 134/80 in the right arm large cuff          Assessment & Plan:   Hypertension. We'll continue to monitor on his present dose of amlodipine.  Home blood pressure monitoring encouraged. He'll call the office if blood pressures consistently greater than 140/90 and we'll consider titration of amlodipine. Low-salt diet exercise weight loss all encouraged. Recheck 6 months

## 2013-04-20 ENCOUNTER — Telehealth: Payer: Self-pay | Admitting: Internal Medicine

## 2013-04-20 NOTE — Telephone Encounter (Signed)
Patient Information:  Caller Name: [place]  Phone: [phone]  Patient: [name], [name]  Gender: Male  DOB: 07/15/1956  Age: 57 Years  PCP: Eleonore Chiquito [facility])  Office Follow Up:  Does the office need to follow up with this patient?: Yes  Instructions For The Office: needs to speak with someone regarding his BP pills; says he is going to start "experimenting" with the ones he has at home; doesn't want to keep scheduling appts when he has so many full bottles of pills at hime; PLEASE call and advise on meds   Symptoms  Reason For Call & Symptoms: calling about BP meds; said he has 3 diff meds at the house; took Lisinopril-Hctz and that didn't work and was changed to Hyzaar; says that med seemed to keep sBP in 120s, but thought it was causing him to cough; now wonders if the cough wasn't more allergy type sxs since it was early spring; wants to go back to taking the Hyzaar; says the Norvasc that he takes now causes him to not feel "normal" in the head, but no specific sxs; says he takes his med each morning with a bite to eat, like a banana  Reviewed Health History In EMR: Yes  Reviewed Medications In EMR: Yes  Reviewed Allergies In EMR: Yes  Reviewed Surgeries / Procedures: Yes  Date of Onset of Symptoms: 04/17/2013  Guideline(s) Used:  High Blood Pressure  Disposition Per Guideline:   See Within 2 Weeks in Office  Reason For Disposition Reached:   BP > 140/90 and is taking BP medications  Advice Given:  General:  Untreated high blood pressure may cause damage to the heart, brain, kidneys, and eyes.  Treatment of high blood pressure can reduce the risk of stroke, heart attack, and heart failure.  The goal of blood pressure treatment for most patients with hypertension is to keep the blood pressure under 140/90.  Lifestyle Changes  Maintain a healthy weight. Lose weight if you are overweight.  Do 30 minutes of aerobic physical activity (e.g., brisk walking) most  days of the week.  Eat a diet high in fresh fruits and low-fat dairy products. Limit your intake of saturated and total fat. Choose foods that are lower in salt.  If you smoke, you should stop.  If you drink alcohol, you should limit your daily alcohol drinking. Women should have no more than one drink per day. Men should have no more than 2 drinks per day. A drink is defined as 1.5 oz hard liquor (one shot or jigger; 45 ml), 5 oz wine (small glass; 150 ml), or 12 oz beer (one can; 360 ml).  Call Back If:  Headache, blurred vision, difficulty talking, or difficulty walking occurs  Chest pain or difficulty breathing occurs  You become worse.  RN Overrode Recommendation:  Follow Up With Office Later  would like to talk to someone from the office

## 2013-04-20 NOTE — Telephone Encounter (Signed)
Ask patient to continue amlodipine but take once daily at bedtime;  return office visit to reassess if blood pressure remains high or  he does not feel he tolerates this medication

## 2013-04-20 NOTE — Telephone Encounter (Signed)
Spoke with patient.

## 2013-04-20 NOTE — Telephone Encounter (Signed)
[facility] Triage Call Report Triage Record Num: [id number] Operator: Maryfrances Bunnell Patient Name: [name] Call Date & Time: 04/19/2013 6:10:17PM Patient Phone: [phone] PCP: Gordy Savers Patient Gender: Male PCP Fax : [phone] Patient DOB: 15-Mar-1956 Practice Name: [facility] Reason for Call: Caller: Donnell/Patient; PCP: Eleonore Chiquito [facility]); CB#: [phone]; Call regarding Issues with blood pressure medication; BP today 04/19/13 has been 140/90; 153/93; and this evening after work 152-93; Has just been feeling drained today and a little light headed; Per emr advised at last visit to monitor bp regularly and schedule appt if consistently over 150/90. Patient has not been taking every day but states all his readings since last visit and med change have been around 140/90. Triaged per Hypertension, Diagnosed or Suspected Protocol with all emergent symptoms ruled out with exception of "Evaluated by provider and has questions/concern about their condition, treatment plan, treatment options, follow-up appointments or other follow-up care." Advised to call office within 72 hrs, monitor bp on a regular basis and follow a low salt, low cal. diet. Patient will f/u with office for appt. Protocol(s) Used: Hypertension, Diagnosed or Suspected Recommended Outcome per Protocol: Call Provider within 72 Hours Reason for Outcome: Evaluated by provider and has question/concern about their condition, treatment plan, treatment options, follow-up appointments, or other follow-up care. Care Advice: ~ HEALTH PROMOTION / MAINTENANCE ~ Tell your provider about any other symptoms that you are currently experiencing or any that have concerned you. ~ Talk with your provider openly regarding your questions about treatment options and available supportive care. Continue to follow your treatment plan until seeing or talking with your provider. Take all medications as  prescribed. If you have questions or concerns about your treatment plan, ask about them when you call for your appointment. ~ ~ Call your provider during regular office hours to discuss questions or concerns about your condition or treatment plan. ~ Discuss reasons for changing or not following your treatment plan with your provider. Participating in support groups with other individuals with chronic conditions may be beneficial. If you are interested in doing so ask your provider for a local resource. ~ 04/19/2013 6:43:39PM Page 1 of 1 [email or web address]

## 2013-05-21 ENCOUNTER — Encounter: Payer: Self-pay | Admitting: Internal Medicine

## 2013-05-21 ENCOUNTER — Ambulatory Visit (INDEPENDENT_AMBULATORY_CARE_PROVIDER_SITE_OTHER): Payer: BC Managed Care – PPO | Admitting: Internal Medicine

## 2013-05-21 VITALS — BP 140/90 | HR 78 | Temp 98.8°F | Resp 20 | Wt 226.0 lb

## 2013-05-21 DIAGNOSIS — I10 Essential (primary) hypertension: Secondary | ICD-10-CM

## 2013-05-21 DIAGNOSIS — M109 Gout, unspecified: Secondary | ICD-10-CM

## 2013-05-21 HISTORY — DX: Gout, unspecified: M10.9

## 2013-05-21 LAB — URIC ACID: Uric Acid, Serum: 7.8 mg/dL (ref 4.0–7.8)

## 2013-05-21 MED ORDER — OXYCODONE HCL 10 MG PO TABS: 10.0000 mg | ORAL_TABLET | Freq: Four times a day (QID) | ORAL | Status: DC | PRN

## 2013-05-21 MED ORDER — PREDNISONE 20 MG PO TABS: 20.0000 mg | ORAL_TABLET | Freq: Two times a day (BID) | ORAL | Status: DC

## 2013-05-21 NOTE — Patient Instructions (Addendum)
Gout  Gout is an inflammatory condition (arthritis) caused by a buildup of uric acid crystals in the joints. Uric acid is a chemical that is normally present in the blood. Under some circumstances, uric acid can form into crystals in your joints. This causes joint redness, soreness, and swelling (inflammation). Repeat attacks are common. Over time, uric acid crystals can form into masses (tophi) near a joint, causing disfigurement. Gout is treatable and often preventable.  CAUSES   The disease begins with elevated levels of uric acid in the blood. Uric acid is produced by your body when it breaks down a naturally found substance called purines. This also happens when you eat certain foods such as meats and fish. Causes of an elevated uric acid level include:   Being passed down from parent to child (heredity).   Diseases that cause increased uric acid production (obesity, psoriasis, some cancers).   Excessive alcohol use.   Diet, especially diets rich in meat and seafood.   Medicines, including certain cancer-fighting drugs (chemotherapy), diuretics, and aspirin.   Chronic kidney disease. The kidneys are no longer able to remove uric acid well.   Problems with metabolism.  Conditions strongly associated with gout include:   Obesity.   High blood pressure.   High cholesterol.   Diabetes.  Not everyone with elevated uric acid levels gets gout. It is not understood why some people get gout and others do not. Surgery, joint injury, and eating too much of certain foods are some of the factors that can lead to gout.  SYMPTOMS    An attack of gout comes on quickly. It causes intense pain with redness, swelling, and warmth in a joint.   Fever can occur.   Often, only one joint is involved. Certain joints are more commonly involved:   Base of the big toe.   Knee.   Ankle.   Wrist.   Finger.  Without treatment, an attack usually goes away in a few days to weeks. Between attacks, you usually will not have  symptoms, which is different from many other forms of arthritis.  DIAGNOSIS   Your caregiver will suspect gout based on your symptoms and exam. Removal of fluid from the joint (arthrocentesis) is done to check for uric acid crystals. Your caregiver will give you a medicine that numbs the area (local anesthetic) and use a needle to remove joint fluid for exam. Gout is confirmed when uric acid crystals are seen in joint fluid, using a special microscope. Sometimes, blood, urine, and X-ray tests are also used.  TREATMENT   There are 2 phases to gout treatment: treating the sudden onset (acute) attack and preventing attacks (prophylaxis).  Treatment of an Acute Attack   Medicines are used. These include anti-inflammatory medicines or steroid medicines.   An injection of steroid medicine into the affected joint is sometimes necessary.   The painful joint is rested. Movement can worsen the arthritis.   You may use warm or cold treatments on painful joints, depending which works best for you.   Discuss the use of coffee, vitamin C, or cherries with your caregiver. These may be helpful treatment options.  Treatment to Prevent Attacks  After the acute attack subsides, your caregiver may advise prophylactic medicine. These medicines either help your kidneys eliminate uric acid from your body or decrease your uric acid production. You may need to stay on these medicines for a very long time.  The early phase of treatment with prophylactic medicine can be associated   with an increase in acute gout attacks. For this reason, during the first few months of treatment, your caregiver may also advise you to take medicines usually used for acute gout treatment. Be sure you understand your caregiver's directions.  You should also discuss dietary treatment with your caregiver. Certain foods such as meats and fish can increase uric acid levels. Other foods such as dairy can decrease levels. Your caregiver can give you a list of foods  to avoid.  HOME CARE INSTRUCTIONS    Do not take aspirin to relieve pain. This raises uric acid levels.   Only take over-the-counter or prescription medicines for pain, discomfort, or fever as directed by your caregiver.   Rest the joint as much as possible. When in bed, keep sheets and blankets off painful areas.   Keep the affected joint raised (elevated).   Use crutches if the painful joint is in your leg.   Drink enough water and fluids to keep your urine clear or pale yellow. This helps your body get rid of uric acid. Do not drink alcoholic beverages. They slow the passage of uric acid.   Follow your caregiver's dietary instructions. Pay careful attention to the amount of protein you eat. Your daily diet should emphasize fruits, vegetables, whole grains, and fat-free or low-fat milk products.   Maintain a healthy body weight.  SEEK MEDICAL CARE IF:    You have an oral temperature above 102 F (38.9 C).   You develop diarrhea, vomiting, or any side effects from medicines.   You do not feel better in 24 hours, or you are getting worse.  SEEK IMMEDIATE MEDICAL CARE IF:    Your joint becomes suddenly more tender and you have:   Chills.   An oral temperature above 102 F (38.9 C), not controlled by medicine.  MAKE SURE YOU:    Understand these instructions.   Will watch your condition.   Will get help right away if you are not doing well or get worse.  Document Released: 09/06/2000 Document Revised: 12/02/2011 Document Reviewed: 12/18/2009  Exit[employer] Patient Information 2014 [employer].

## 2013-05-21 NOTE — Progress Notes (Signed)
  Subjective:    Patient ID: [name], male    DOB: 03/31/1956, 57 y.o.   MRN: [id number]  HPI  57 year old patient who presents with a three-day history of increasing pain and swelling involving the left foot area. There has been no trauma.  He has had similar issues in the past and was told in the ER setting that he had gout.  No fever or other constitutional complaints. Pain has intensified to the point where he has difficulty bearing weight  History reviewed. No pertinent past medical history.  History   Social History  . Marital Status: Legally Separated    Spouse Name: N/A    Number of Children: N/A  . Years of Education: N/A   Occupational History  . Not on file.   Social History Main Topics  . Smoking status: Never Smoker   . Smokeless tobacco: Not on file  . Alcohol Use: No  . Drug Use: No  . Sexual Activity:    Other Topics Concern  . Not on file   Social History Narrative  . No narrative on file    Past Surgical History  Procedure Laterality Date  . Hernia repair      No family history on file.  Allergies  Allergen Reactions  . Lisinopril     Cough    Current Outpatient Prescriptions on File Prior to Visit  Medication Sig Dispense Refill  . amLODipine (NORVASC) 5 MG tablet Take 1 tablet (5 mg total) by mouth daily.  30 tablet  2  . Hydrocodone-Acetaminophen 7.5-300 MG TABS Take 1 tablet by mouth 4 (four) times daily as needed.  60 each  2  . ibuprofen (ADVIL,MOTRIN) 200 MG tablet Take 400 mg by mouth every 6 (six) hours as needed for pain.      . Multiple Vitamins-Minerals (MENS 50+ MULTI VITAMIN/MIN PO) Take 1 tablet by mouth daily.       No current facility-administered medications on file prior to visit.    BP 140/90  Pulse 78  Temp(Src) 98.8 F (37.1 C) (Oral)  Resp 20  Wt 226 lb (102.513 kg)  BMI 33.36 kg/m2  SpO2 97%       Review of Systems  Musculoskeletal: Positive for joint swelling and gait problem.       Objective:    Physical Exam  Constitutional: He appears well-developed and well-nourished. No distress.  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 Will treat with prednisone for 7 days as well as analgesics. Information was dispensed to the patient concerning an acute gout and dietary interventions. We'll check a uric acid level  Hypertension stable

## 2013-06-15 ENCOUNTER — Telehealth: Payer: Self-pay | Admitting: Internal Medicine

## 2013-06-15 ENCOUNTER — Other Ambulatory Visit: Payer: Self-pay | Admitting: Internal Medicine

## 2013-06-15 MED ORDER — PREDNISONE 20 MG PO TABS: 20.0000 mg | ORAL_TABLET | Freq: Two times a day (BID) | ORAL | Status: DC

## 2013-06-15 NOTE — Telephone Encounter (Signed)
Okay prednisone 20 mg #14 one twice a day

## 2013-06-15 NOTE — Telephone Encounter (Signed)
Patient Information:  Caller Name: [name]  Phone: [phone]  Patient: [name], [name]  Gender: Male  DOB: 03/19/1956  Age: 57 Years  PCP: Eleonore Chiquito [facility])  Office Follow Up:  Does the office need to follow up with this patient?: Yes  Instructions For The Office: Pls see RN note  RN Note:  ER CALL.  Gout Flare up at Left Big Toe, Last seen on 8-29, treated w/ Prednisone.  Pt would like to know if Dr Lesia Hausen would write him another Prednisone RX, d/t recent evaluation for same Gout pain.  Appt offered d/t Prednisone RX, Pt unable to come d/t job schedule.  Pt uses [facility] on [address].  Please review w/ Dr Amador Cunas and f/u w/ Pt if MD will send Prednisone RX to [facility] or is appt necessary.  Symptoms  Reason For Call & Symptoms: ER CALL.  Gout Flare up at Left Big Toe, Last seen on 8-29, treated w/ Prednisone.  Reviewed Health History In EMR: N/A  Reviewed Medications In EMR: N/A  Reviewed Allergies In EMR: N/A  Reviewed Surgeries / Procedures: N/A  Date of Onset of Symptoms: 06/13/2013  Guideline(s) Used:  Toe Pain  Disposition Per Guideline:   See Within 3 Days in Office  Reason For Disposition Reached:   Moderate pain (e.g., interferes with normal activities, limping) and present > 3 days  Advice Given:  N/A  Patient Will Follow Care Advice:  YES

## 2013-06-15 NOTE — Telephone Encounter (Signed)
Left message on personally identified home number informing the pt that the medication has been sent to his local pharmacy.

## 2013-08-20 ENCOUNTER — Other Ambulatory Visit: Payer: Self-pay | Admitting: Internal Medicine

## 2013-08-23 ENCOUNTER — Other Ambulatory Visit: Payer: Self-pay | Admitting: Internal Medicine

## 2013-08-31 ENCOUNTER — Encounter: Payer: Self-pay | Admitting: Internal Medicine

## 2013-08-31 ENCOUNTER — Ambulatory Visit (INDEPENDENT_AMBULATORY_CARE_PROVIDER_SITE_OTHER): Payer: BC Managed Care – PPO | Admitting: Internal Medicine

## 2013-08-31 VITALS — BP 152/90 | HR 88 | Resp 20 | Wt 228.0 lb

## 2013-08-31 DIAGNOSIS — Z0000 Encounter for general adult medical examination without abnormal findings: Secondary | ICD-10-CM

## 2013-08-31 DIAGNOSIS — I10 Essential (primary) hypertension: Secondary | ICD-10-CM

## 2013-08-31 DIAGNOSIS — M109 Gout, unspecified: Secondary | ICD-10-CM

## 2013-08-31 MED ORDER — LOSARTAN POTASSIUM 100 MG PO TABS: 100.0000 mg | ORAL_TABLET | Freq: Every day | ORAL | Status: DC

## 2013-08-31 MED ORDER — HYDROCODONE-ACETAMINOPHEN 7.5-300 MG PO TABS: 1.0000 | ORAL_TABLET | Freq: Four times a day (QID) | ORAL | Status: DC | PRN

## 2013-08-31 NOTE — Progress Notes (Signed)
Pre-visit discussion using our clinic review tool. No additional management support is needed unless otherwise documented below in the visit note.  

## 2013-08-31 NOTE — Progress Notes (Signed)
Subjective:    Patient ID: [name], male    DOB: 1956/03/02, 57 y.o.   MRN: [id number]  HPI  57 year old patient who has treated hypertension. He presents with a chief complaint of pain involving the left great toe area. He has a prior history of probable gout. He does have a history of a significant bunion involving the left great toe and spends much time on his feet. His pain is improving. He is having no other areas of arthritis. The uric acid level was obtained and was mildly elevated.  History reviewed. No pertinent past medical history.  History   Social History  . Marital Status: Legally Separated    Spouse Name: N/A    Number of Children: N/A  . Years of Education: N/A   Occupational History  . Not on file.   Social History Main Topics  . Smoking status: Never Smoker   . Smokeless tobacco: Not on file  . Alcohol Use: No  . Drug Use: No  . Sexual Activity:    Other Topics Concern  . Not on file   Social History Narrative  . No narrative on file    Past Surgical History  Procedure Laterality Date  . Hernia repair      No family history on file.  Allergies  Allergen Reactions  . Lisinopril     Cough    Current Outpatient Prescriptions on File Prior to Visit  Medication Sig Dispense Refill  . amLODipine (NORVASC) 5 MG tablet TAKE 1 TABLET (5 MG TOTAL) BY MOUTH DAILY.  30 tablet  2  . ibuprofen (ADVIL,MOTRIN) 200 MG tablet Take 400 mg by mouth every 6 (six) hours as needed for pain.      . Multiple Vitamins-Minerals (MENS 50+ MULTI VITAMIN/MIN PO) Take 1 tablet by mouth daily.       No current facility-administered medications on file prior to visit.    BP 152/90  Pulse 88  Resp 20  Wt 228 lb (103.42 kg)  SpO2 97%       Review of Systems  Constitutional: Negative for fever, chills, appetite change and fatigue.  HENT: Negative for congestion, dental problem, ear pain, hearing loss, sore throat, tinnitus, trouble swallowing and voice  change.   Eyes: Negative for pain, discharge and visual disturbance.  Respiratory: Negative for cough, chest tightness, wheezing and stridor.   Cardiovascular: Negative for chest pain, palpitations and leg swelling.  Gastrointestinal: Negative for nausea, vomiting, abdominal pain, diarrhea, constipation, blood in stool and abdominal distention.  Genitourinary: Negative for urgency, hematuria, flank pain, discharge, difficulty urinating and genital sores.  Musculoskeletal: Positive for arthralgias and gait problem. Negative for back pain, joint swelling, myalgias and neck stiffness.  Skin: Negative for rash.  Neurological: Negative for dizziness, syncope, speech difficulty, weakness, numbness and headaches.  Hematological: Negative for adenopathy. Does not bruise/bleed easily.  Psychiatric/Behavioral: Negative for behavioral problems and dysphoric mood. The patient is not nervous/anxious.        Objective:   Physical Exam  Constitutional: He appears well-developed and well-nourished. No distress.  Blood pressure 150/90  Musculoskeletal:  Significant left hallux valgus deformity but no active inflammation except for tenderness over the first MTP joint          Assessment & Plan:  Painful left great toe/with hallux valgus deformity. Unclear whether this represents resolving gout or possible pain related to bunion and overuse Hypertension suboptimal control. We'll at losartan 100 mg daily for better blood pressure control as  well as uricosuric effect.  Recheck 3 months

## 2013-08-31 NOTE — Patient Instructions (Signed)
Limit your sodium (Salt) intake  Please check your blood pressure on a regular basis.  If it is consistently greater than 150/90, please make an office appointment.   Gout Gout is an inflammatory arthritis caused by a buildup of uric acid crystals in the joints. Uric acid is a chemical that is normally present in the blood. When the level of uric acid in the blood is too high it can form crystals that deposit in your joints and tissues. This causes joint redness, soreness, and swelling (inflammation). Repeat attacks are common. Over time, uric acid crystals can form into masses (tophi) near a joint, destroying bone and causing disfigurement. Gout is treatable and often preventable. CAUSES  The disease begins with elevated levels of uric acid in the blood. Uric acid is produced by your body when it breaks down a naturally found substance called purines. Certain foods you eat, such as meats and fish, contain high amounts of purines. Causes of an elevated uric acid level include:  Being passed down from parent to child (heredity).  Diseases that cause increased uric acid production (such as obesity, psoriasis, and certain cancers).  Excessive alcohol use.  Diet, especially diets rich in meat and seafood.  Medicines, including certain cancer-fighting medicines (chemotherapy), water pills (diuretics), and aspirin.  Chronic kidney disease. The kidneys are no longer able to remove uric acid well.  Problems with metabolism. Conditions strongly associated with gout include:  Obesity.  High blood pressure.  High cholesterol.  Diabetes. Not everyone with elevated uric acid levels gets gout. It is not understood why some people get gout and others do not. Surgery, joint injury, and eating too much of certain foods are some of the factors that can lead to gout attacks. SYMPTOMS   An attack of gout comes on quickly. It causes intense pain with redness, swelling, and warmth in a joint.  Fever  can occur.  Often, only one joint is involved. Certain joints are more commonly involved:  Base of the big toe.  Knee.  Ankle.  Wrist.  Finger. Without treatment, an attack usually goes away in a few days to weeks. Between attacks, you usually will not have symptoms, which is different from many other forms of arthritis. DIAGNOSIS  Your caregiver will suspect gout based on your symptoms and exam. In some cases, tests may be recommended. The tests may include:  Blood tests.  Urine tests.  X-rays.  Joint fluid exam. This exam requires a needle to remove fluid from the joint (arthrocentesis). Using a microscope, gout is confirmed when uric acid crystals are seen in the joint fluid. TREATMENT  There are two phases to gout treatment: treating the sudden onset (acute) attack and preventing attacks (prophylaxis).  Treatment of an Acute Attack.  Medicines are used. These include anti-inflammatory medicines or steroid medicines.  An injection of steroid medicine into the affected joint is sometimes necessary.  The painful joint is rested. Movement can worsen the arthritis.  You may use warm or cold treatments on painful joints, depending which works best for you.  Treatment to Prevent Attacks.  If you suffer from frequent gout attacks, your caregiver may advise preventive medicine. These medicines are started after the acute attack subsides. These medicines either help your kidneys eliminate uric acid from your body or decrease your uric acid production. You may need to stay on these medicines for a very long time.  The early phase of treatment with preventive medicine can be associated with an increase in acute   gout attacks. For this reason, during the first few months of treatment, your caregiver may also advise you to take medicines usually used for acute gout treatment. Be sure you understand your caregiver's directions. Your caregiver may make several adjustments to your medicine  dose before these medicines are effective.  Discuss dietary treatment with your caregiver or dietitian. Alcohol and drinks high in sugar and fructose and foods such as meat, poultry, and seafood can increase uric acid levels. Your caregiver or dietician can advise you on drinks and foods that should be limited. HOME CARE INSTRUCTIONS   Do not take aspirin to relieve pain. This raises uric acid levels.  Only take over-the-counter or prescription medicines for pain, discomfort, or fever as directed by your caregiver.  Rest the joint as much as possible. When in bed, keep sheets and blankets off painful areas.  Keep the affected joint raised (elevated).  Apply warm or cold treatments to painful joints. Use of warm or cold treatments depends on which works best for you.  Use crutches if the painful joint is in your leg.  Drink enough fluids to keep your urine clear or pale yellow. This helps your body get rid of uric acid. Limit alcohol, sugary drinks, and fructose drinks.  Follow your dietary instructions. Pay careful attention to the amount of protein you eat. Your daily diet should emphasize fruits, vegetables, whole grains, and fat-free or low-fat milk products. Discuss the use of coffee, vitamin C, and cherries with your caregiver or dietician. These may be helpful in lowering uric acid levels.  Maintain a healthy body weight. SEEK MEDICAL CARE IF:   You develop diarrhea, vomiting, or any side effects from medicines.  You do not feel better in 24 hours, or you are getting worse. SEEK IMMEDIATE MEDICAL CARE IF:   Your joint becomes suddenly more tender, and you have chills or a fever. MAKE SURE YOU:   Understand these instructions.  Will watch your condition.  Will get help right away if you are not doing well or get worse. Document Released: 09/06/2000 Document Revised: 01/04/2013 Document Reviewed: 04/22/2012 [employer] Patient Information 2014 [employer]. Purine  Restricted Diet A low-purine diet consists of foods that reduce uric acid made in your body. INDICATIONS FOR USE  Your caregiver may ask you to follow a low-purine diet to reduce gout flairs.  GUIDELINES  Avoid high-purine foods, including all alcohol, yeast extracts taken as supplements, and sauces made from meats (like gravy). Do not eat high-purine meats, including anchovies, sardines, herring, mussels, tuna, codfish, scallops, trout, haddock, bacon, organ meats, tripe, goose, wild game, and sweetbreads.  Grains  Allowed/Recommended: All, except those listed to consume in moderation.  Consume in Moderation: Oatmeal ( cup uncooked daily), wheat bran or germ ( cup daily), and whole grains. Vegetables  Allowed/Recommended: All, except those listed to consume in moderation.  Consume in Moderation: Asparagus, cauliflower, spinach, mushrooms, and green peas ( cup daily). Fruit  Allowed/Recommended: All.  Consume in Moderation: None. Meat and Meat Substitutes  Allowed/Recommended: Eggs, nuts, and peanut butter.  Consume in Moderation: Limit to 4 to 6 oz daily. Avoid high-purine meats. Lentils, peas, and dried beans (1 cup daily). Milk  Allowed/Recommended: All. Choose low-fat or skim when possible.  Consume in Moderation: None. Fats and Oils  Allowed/Recommended: All.  Consume in Moderation: None. Beverages  Allowed/Recommended: All, except those listed to avoid.  Avoid: All alcohol. Condiments/Miscellaneous  Allowed/Recommended: All, except those listed to consume in moderation.  Consume in Moderation:   Bouillon and meat-based broths and soups. Document Released: 01/04/2011 Document Revised: 12/02/2011 Document Reviewed: 01/04/2011 [employer] Patient Information 2014 [employer].  

## 2013-09-28 ENCOUNTER — Other Ambulatory Visit: Payer: Self-pay | Admitting: Internal Medicine

## 2013-11-02 ENCOUNTER — Encounter: Payer: Self-pay | Admitting: Internal Medicine

## 2013-12-07 ENCOUNTER — Encounter: Payer: Self-pay | Admitting: Internal Medicine

## 2013-12-07 ENCOUNTER — Ambulatory Visit (INDEPENDENT_AMBULATORY_CARE_PROVIDER_SITE_OTHER): Payer: BC Managed Care – PPO | Admitting: Internal Medicine

## 2013-12-07 VITALS — BP 140/90 | HR 95 | Temp 98.6°F | Resp 20 | Ht 69.0 in | Wt 229.0 lb

## 2013-12-07 DIAGNOSIS — M549 Dorsalgia, unspecified: Secondary | ICD-10-CM

## 2013-12-07 DIAGNOSIS — I10 Essential (primary) hypertension: Secondary | ICD-10-CM

## 2013-12-07 MED ORDER — HYDROCODONE-ACETAMINOPHEN 5-325 MG PO TABS: 1.0000 | ORAL_TABLET | Freq: Four times a day (QID) | ORAL | Status: DC | PRN

## 2013-12-07 MED ORDER — AMLODIPINE BESYLATE 5 MG PO TABS: ORAL_TABLET | ORAL | Status: DC

## 2013-12-07 NOTE — Patient Instructions (Signed)
Limit your sodium (Salt) intake  Please check your blood pressure on a regular basis.  If it is consistently greater than 150/90, please make an office appointment.  Return in 6 months for follow-up   

## 2013-12-07 NOTE — Progress Notes (Signed)
Pre-visit discussion using our clinic review tool. No additional management support is needed unless otherwise documented below in the visit note.  

## 2013-12-07 NOTE — Progress Notes (Signed)
Subjective:    Patient ID: [name], male    DOB: 27-Apr-1956, 58 y.o.   MRN: [id number]  HPI  58 year old patient who has treated hypertension.  He also has a history of gout.  Cozaar was added to his regimen 3 months ago.  He has history of chronic low back pain with intermittent flares.  Is requesting a refill on Vicodin.  History reviewed. No pertinent past medical history.  History   Social History  . Marital Status: Legally Separated    Spouse Name: N/A    Number of Children: N/A  . Years of Education: N/A   Occupational History  . Not on file.   Social History Main Topics  . Smoking status: Never Smoker   . Smokeless tobacco: Not on file  . Alcohol Use: No  . Drug Use: No  . Sexual Activity:    Other Topics Concern  . Not on file   Social History Narrative  . No narrative on file    Past Surgical History  Procedure Laterality Date  . Hernia repair      No family history on file.  Allergies  Allergen Reactions  . Lisinopril     Cough    Current Outpatient Prescriptions on File Prior to Visit  Medication Sig Dispense Refill  . ibuprofen (ADVIL,MOTRIN) 200 MG tablet Take 400 mg by mouth every 6 (six) hours as needed for pain.      Marland Kitchen losartan (COZAAR) 100 MG tablet Take 1 tablet (100 mg total) by mouth daily.  90 tablet  3  . Multiple Vitamins-Minerals (MENS 50+ MULTI VITAMIN/MIN PO) Take 1 tablet by mouth daily.       No current facility-administered medications on file prior to visit.    BP 140/90  Pulse 95  Temp(Src) 98.6 F (37 C) (Oral)  Resp 20  Ht 5\' 9"  (1.753 m)  Wt 229 lb (103.874 kg)  BMI 33.80 kg/m2  SpO2 96%       Review of Systems  Constitutional: Negative for fever, chills, appetite change and fatigue.  HENT: Negative for congestion, dental problem, ear pain, hearing loss, sore throat, tinnitus, trouble swallowing and voice change.   Eyes: Negative for pain, discharge and visual disturbance.  Respiratory: Negative for  cough, chest tightness, wheezing and stridor.   Cardiovascular: Negative for chest pain, palpitations and leg swelling.  Gastrointestinal: Negative for nausea, vomiting, abdominal pain, diarrhea, constipation, blood in stool and abdominal distention.  Genitourinary: Negative for urgency, hematuria, flank pain, discharge, difficulty urinating and genital sores.  Musculoskeletal: Positive for back pain. Negative for arthralgias, gait problem, joint swelling, myalgias and neck stiffness.  Skin: Negative for rash.  Neurological: Negative for dizziness, syncope, speech difficulty, weakness, numbness and headaches.  Hematological: Negative for adenopathy. Does not bruise/bleed easily.  Psychiatric/Behavioral: Negative for behavioral problems and dysphoric mood. The patient is not nervous/anxious.        Objective:   Physical Exam  Constitutional: He is oriented to person, place, and time. He appears well-developed.  HENT:  Head: Normocephalic.  Right Ear: External ear normal.  Left Ear: External ear normal.  Eyes: Conjunctivae and EOM are normal.  Neck: Normal range of motion.  Cardiovascular: Normal rate and normal heart sounds.   Pulmonary/Chest: Breath sounds normal.  Abdominal: Bowel sounds are normal.  Musculoskeletal: Normal range of motion. He exhibits no edema and no tenderness.  Neurological: He is alert and oriented to person, place, and time.  Psychiatric: He has a normal  mood and affect. His behavior is normal.          Assessment & Plan:   Hypertension.  Reasonable control.  Blood pressure 130/90 Chronic intermittent low back pain.  Vicodin refilled  CPX 6 months

## 2014-01-28 ENCOUNTER — Ambulatory Visit (INDEPENDENT_AMBULATORY_CARE_PROVIDER_SITE_OTHER): Payer: BC Managed Care – PPO | Admitting: Internal Medicine

## 2014-01-28 ENCOUNTER — Encounter: Payer: Self-pay | Admitting: Internal Medicine

## 2014-01-28 VITALS — BP 150/90 | HR 60 | Temp 98.0°F | Resp 20 | Ht 69.0 in | Wt 230.0 lb

## 2014-01-28 DIAGNOSIS — I10 Essential (primary) hypertension: Secondary | ICD-10-CM

## 2014-01-28 NOTE — Patient Instructions (Signed)
You  may move around, but avoid painful motions and activities.    Call or return to clinic prn if these symptoms worsen or fail to improve as anticipated.   

## 2014-01-28 NOTE — Progress Notes (Signed)
Subjective:    Patient ID: [name], male    DOB: 23-Oct-1955, 58 y.o.   MRN: [id number]  HPI  58 year old patient who has treated hypertension.  He presents with a two-week history of pain involving his right anterior lower chest wall area.  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  He also is quite active at work with heavy lifting.  Pain is aggravated by movement , especially lifting his right arm over his head  History reviewed. No pertinent past medical history.  History   Social History  . Marital Status: Legally Separated    Spouse Name: N/A    Number of Children: N/A  . Years of Education: N/A   Occupational History  . Not on file.   Social History Main Topics  . Smoking status: Never Smoker   . Smokeless tobacco: Not on file  . Alcohol Use: No  . Drug Use: No  . Sexual Activity:    Other Topics Concern  . Not on file   Social History Narrative  . No narrative on file    Past Surgical History  Procedure Laterality Date  . Hernia repair      No family history on file.  Allergies  Allergen Reactions  . Lisinopril     Cough    Current Outpatient Prescriptions on File Prior to Visit  Medication Sig Dispense Refill  . amLODipine (NORVASC) 5 MG tablet TAKE 1 TABLET (5 MG TOTAL) BY MOUTH DAILY.  30 tablet  5  . HYDROcodone-acetaminophen (NORCO/VICODIN) 5-325 MG per tablet Take 1 tablet by mouth every 6 (six) hours as needed.  60 tablet  0  . ibuprofen (ADVIL,MOTRIN) 200 MG tablet Take 400 mg by mouth every 6 (six) hours as needed for pain.      Marland Kitchen losartan (COZAAR) 100 MG tablet Take 1 tablet (100 mg total) by mouth daily.  90 tablet  3  . Multiple Vitamins-Minerals (MENS 50+ MULTI VITAMIN/MIN PO) Take 1 tablet by mouth daily.       No current facility-administered medications on file prior to visit.    BP 150/90  Pulse 60  Temp(Src) 98 F (36.7 C) (Oral)  Resp 20  Ht 5\' 9"  (1.753 m)  Wt 230 lb (104.327 kg)  BMI 33.95  kg/m2  SpO2 98%       Review of Systems  Constitutional: Negative for fever, chills, appetite change and fatigue.  HENT: Negative for congestion, dental problem, ear pain, hearing loss, sore throat, tinnitus, trouble swallowing and voice change.   Eyes: Negative for pain, discharge and visual disturbance.  Respiratory: Negative for cough, chest tightness, wheezing and stridor.   Cardiovascular: Positive for chest pain. Negative for palpitations and leg swelling.  Gastrointestinal: Negative for nausea, vomiting, abdominal pain, diarrhea, constipation, blood in stool and abdominal distention.  Genitourinary: Negative for urgency, hematuria, flank pain, discharge, difficulty urinating and genital sores.  Musculoskeletal: Negative for arthralgias, back pain, gait problem, joint swelling, myalgias and neck stiffness.  Skin: Negative for rash.  Neurological: Negative for dizziness, syncope, speech difficulty, weakness, numbness and headaches.  Hematological: Negative for adenopathy. Does not bruise/bleed easily.  Psychiatric/Behavioral: Negative for behavioral problems and dysphoric mood. The patient is not nervous/anxious.        Objective:   Physical Exam  Constitutional: He appears well-developed and well-nourished. No distress.  Repeat blood pressure 140/90  Cardiovascular: Normal rate, regular rhythm and normal heart sounds.   No murmur heard. Pulmonary/Chest:  Effort normal and breath sounds normal. No respiratory distress. He has no wheezes. He exhibits tenderness.  Tenderness over the right lower anterior chest wall (costochondral junction)  Abdominal: Soft. Bowel sounds are normal. He exhibits no distension. There is no tenderness.          Assessment & Plan:   Chest wall pain.  He will use Aleve twice a day  Patient does have Vicodin to take when necessary for more severe pain Hypertension stable.  Low salt diet recommended as well as home blood pressure monitoring.  CPX  as scheduled

## 2014-01-28 NOTE — Progress Notes (Signed)
Pre-visit discussion using our clinic review tool. No additional management support is needed unless otherwise documented below in the visit note.  

## 2014-01-29 ENCOUNTER — Telehealth: Payer: Self-pay | Admitting: Internal Medicine

## 2014-01-29 NOTE — Telephone Encounter (Signed)
Relevant patient education mailed to patient.  

## 2014-02-17 ENCOUNTER — Telehealth: Payer: Self-pay | Admitting: Internal Medicine

## 2014-02-17 MED ORDER — HYDROCODONE-ACETAMINOPHEN 5-325 MG PO TABS: 1.0000 | ORAL_TABLET | Freq: Four times a day (QID) | ORAL | Status: DC | PRN

## 2014-02-17 NOTE — Telephone Encounter (Signed)
Pt needs new rx hydrocodone °

## 2014-02-17 NOTE — Telephone Encounter (Signed)
Pt notified Rx ready for pickup. Rx printed and signed.  

## 2014-05-03 ENCOUNTER — Telehealth: Payer: Self-pay | Admitting: Internal Medicine

## 2014-05-03 MED ORDER — HYDROCODONE-ACETAMINOPHEN 5-325 MG PO TABS: 1.0000 | ORAL_TABLET | Freq: Four times a day (QID) | ORAL | Status: DC | PRN

## 2014-05-03 NOTE — Telephone Encounter (Signed)
Pt notified Rx ready for pickup. Rx printed and signed.  

## 2014-05-03 NOTE — Telephone Encounter (Signed)
Pt needs refill on rx hydrocodone.  Please call when ready for pickup [phone]

## 2014-07-18 ENCOUNTER — Telehealth: Payer: Self-pay | Admitting: Internal Medicine

## 2014-07-18 MED ORDER — HYDROCODONE-ACETAMINOPHEN 5-325 MG PO TABS: 1.0000 | ORAL_TABLET | Freq: Four times a day (QID) | ORAL | Status: DC | PRN

## 2014-07-18 NOTE — Telephone Encounter (Signed)
Left detailed message Rx ready for pickup. Rx printed and signed. 

## 2014-07-18 NOTE — Telephone Encounter (Signed)
Pt request refill HYDROcodone-acetaminophen (NORCO/VICODIN) 5-325 MG per tablet °

## 2014-07-28 ENCOUNTER — Other Ambulatory Visit: Payer: Self-pay | Admitting: Internal Medicine

## 2014-10-05 ENCOUNTER — Telehealth: Payer: Self-pay | Admitting: Internal Medicine

## 2014-10-05 NOTE — Telephone Encounter (Signed)
Pt request refill of the following: HYDROcodone-acetaminophen (NORCO/VICODIN) 5-325 MG per tablet ° ° °Phamacy: °

## 2014-10-06 MED ORDER — HYDROCODONE-ACETAMINOPHEN 5-325 MG PO TABS: 1.0000 | ORAL_TABLET | Freq: Four times a day (QID) | ORAL | Status: DC | PRN

## 2014-10-06 NOTE — Telephone Encounter (Signed)
Pt notified Rx ready for pickup. Rx printed and signed.  

## 2014-10-11 ENCOUNTER — Other Ambulatory Visit: Payer: Self-pay | Admitting: Internal Medicine

## 2014-10-20 ENCOUNTER — Encounter: Payer: Self-pay | Admitting: *Deleted

## 2014-10-20 ENCOUNTER — Encounter: Payer: Self-pay | Admitting: Internal Medicine

## 2014-10-20 ENCOUNTER — Ambulatory Visit (INDEPENDENT_AMBULATORY_CARE_PROVIDER_SITE_OTHER): Payer: BLUE CROSS/BLUE SHIELD | Admitting: Internal Medicine

## 2014-10-20 DIAGNOSIS — M25511 Pain in right shoulder: Secondary | ICD-10-CM

## 2014-10-20 DIAGNOSIS — I10 Essential (primary) hypertension: Secondary | ICD-10-CM

## 2014-10-20 MED ORDER — METHYLPREDNISOLONE ACETATE 80 MG/ML IJ SUSP
80.0000 mg | Freq: Once | INTRAMUSCULAR | Status: AC
  Administered 2014-10-20: 80 mg via INTRAMUSCULAR

## 2014-10-20 MED ORDER — CYCLOBENZAPRINE HCL 10 MG PO TABS: 10.0000 mg | ORAL_TABLET | Freq: Three times a day (TID) | ORAL | Status: DC | PRN

## 2014-10-20 NOTE — Patient Instructions (Addendum)
Cervical Sprain A cervical sprain is an injury in the neck in which the strong, fibrous tissues (ligaments) that connect your neck bones stretch or tear. Cervical sprains can range from mild to severe. Severe cervical sprains can cause the neck vertebrae to be unstable. This can lead to damage of the spinal cord and can result in serious nervous system problems. The amount of time it takes for a cervical sprain to get better depends on the cause and extent of the injury. Most cervical sprains heal in 1 to 3 weeks. CAUSES  Severe cervical sprains may be caused by:   Contact sport injuries (such as from football, rugby, wrestling, hockey, auto racing, gymnastics, diving, martial arts, or boxing).   Motor vehicle collisions.   Whiplash injuries. This is an injury from a sudden forward and backward whipping movement of the head and neck.  Falls.  Mild cervical sprains may be caused by:   Being in an awkward position, such as while cradling a telephone between your ear and shoulder.   Sitting in a chair that does not offer proper support.   Working at a poorly Landscape architect station.   Looking up or down for long periods of time.  SYMPTOMS   Pain, soreness, stiffness, or a burning sensation in the front, back, or sides of the neck. This discomfort may develop immediately after the injury or slowly, 24 hours or more after the injury.   Pain or tenderness directly in the middle of the back of the neck.   Shoulder or upper back pain.   Limited ability to move the neck.   Headache.   Dizziness.   Weakness, numbness, or tingling in the hands or arms.   Muscle spasms.   Difficulty swallowing or chewing.   Tenderness and swelling of the neck.  DIAGNOSIS  Most of the time your health care provider can diagnose a cervical sprain by taking your history and doing a physical exam. Your health care provider will ask about previous neck injuries and any known neck  problems, such as arthritis in the neck. X-rays may be taken to find out if there are any other problems, such as with the bones of the neck. Other tests, such as a CT scan or MRI, may also be needed.  TREATMENT  Treatment depends on the severity of the cervical sprain. Mild sprains can be treated with rest, keeping the neck in place (immobilization), and pain medicines. Severe cervical sprains are immediately immobilized. Further treatment is done to help with pain, muscle spasms, and other symptoms and may include:  Medicines, such as pain relievers, numbing medicines, or muscle relaxants.   Physical therapy. This may involve stretching exercises, strengthening exercises, and posture training. Exercises and improved posture can help stabilize the neck, strengthen muscles, and help stop symptoms from returning.  HOME CARE INSTRUCTIONS   You  may move around, but avoid painful motions and activities.  Apply heat to the sore area for 15 to 20 minutes 3 or 4 times daily for the next two to 3 days.  If your injury was severe, you may have been given a cervical collar to wear. A cervical collar is a two-piece collar designed to keep your neck from moving while it heals.  Do not remove the collar unless instructed by your health care provider.  If you have long hair, keep it outside of the collar.  Ask your health care provider before making any adjustments to your collar. Minor adjustments may be required  over time to improve comfort and reduce pressure on your chin or on the back of your head.  Ifyou are allowed to remove the collar for cleaning or bathing, follow your health care provider's instructions on how to do so safely.  Keep your collar clean by wiping it with mild soap and water and drying it completely. If the collar you have been given includes removable pads, remove them every 1-2 days and hand wash them with soap and water. Allow them to air dry. They should be completely dry  before you wear them in the collar.  If you are allowed to remove the collar for cleaning and bathing, wash and dry the skin of your neck. Check your skin for irritation or sores. If you see any, tell your health care provider.  Do not drive while wearing the collar.   Only take over-the-counter or prescription medicines for pain, discomfort, or fever as directed by your health care provider.   Keep all follow-up appointments as directed by your health care provider.   Keep all physical therapy appointments as directed by your health care provider.   Make any needed adjustments to your workstation to promote good posture.   Avoid positions and activities that make your symptoms worse.   Warm up and stretch before being active to help prevent problems.  SEEK MEDICAL CARE IF:   Your pain is not controlled with medicine.   You are unable to decrease your pain medicine over time as planned.   Your activity level is not improving as expected.  SEEK IMMEDIATE MEDICAL CARE IF:   You develop any bleeding.  You develop stomach upset.  You have signs of an allergic reaction to your medicine.   Your symptoms get worse.   You develop new, unexplained symptoms.   You have numbness, tingling, weakness, or paralysis in any part of your body.  MAKE SURE YOU:   Understand these instructions.  Will watch your condition.  Will get help right away if you are not doing well or get worse. Document Released: 07/07/2007 Document Revised: 09/14/2013 Document Reviewed: 03/17/2013 [facility] Patient Information 2015 [place], [place]. This information is not intended to replace advice given to you by your health care provider. Make sure you discuss any questions you have with your health care provider.

## 2014-10-20 NOTE — Addendum Note (Signed)
Addended by: Marian Sorrow on: 10/20/2014 05:28 PM   Modules accepted: Orders

## 2014-10-20 NOTE — Progress Notes (Signed)
Subjective:    Patient ID: [name], male    DOB: 21-Dec-1955, 59 y.o.   MRN: [id number]  HPI  59 year old patient who presents with a chief complaint of right-sided neck pain of 2-3 months duration.  Pain is aggravated by head turning, especially to the right pain is probably worse at night, interfering with sleep, but is also bothersome during the day.  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  No motor weakness.  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  He does have a history of episodic low back pain, but no regular ibuprofen or analgesic use.  No radicular symptoms  No past medical history on file.  History   Social History  . Marital Status: Legally Separated    Spouse Name: N/A    Number of Children: N/A  . Years of Education: N/A   Occupational History  . Not on file.   Social History Main Topics  . Smoking status: Never Smoker   . Smokeless tobacco: Not on file  . Alcohol Use: No  . Drug Use: No  . Sexual Activity: Not on file   Other Topics Concern  . Not on file   Social History Narrative    Past Surgical History  Procedure Laterality Date  . Hernia repair      No family history on file.  Allergies  Allergen Reactions  . Lisinopril     Cough    Current Outpatient Prescriptions on File Prior to Visit  Medication Sig Dispense Refill  . amLODipine (NORVASC) 5 MG tablet TAKE 1 TABLET (5 MG TOTAL) BY MOUTH DAILY. 30 tablet 2  . HYDROcodone-acetaminophen (NORCO/VICODIN) 5-325 MG per tablet Take 1 tablet by mouth every 6 (six) hours as needed. 60 tablet 0  . ibuprofen (ADVIL,MOTRIN) 200 MG tablet Take 400 mg by mouth every 6 (six) hours as needed for pain.    Marland Kitchen losartan (COZAAR) 100 MG tablet TAKE 1 TABLET (100 MG TOTAL) BY MOUTH DAILY. 90 tablet 2  . Multiple Vitamins-Minerals (MENS 50+ MULTI VITAMIN/MIN PO) Take 1 tablet by mouth daily.     No current facility-administered medications on file prior to  visit.    BP 126/90 mmHg  Pulse 72  Temp(Src) 98.2 F (36.8 C) (Oral)  Resp 20  Ht 5\' 9"  (1.753 m)  Wt 224 lb (101.606 kg)  BMI 33.06 kg/m2  SpO2 98%     Review of Systems  Constitutional: Negative for fever, chills, appetite change and fatigue.  HENT: Negative for congestion, dental problem, ear pain, hearing loss, sore throat, tinnitus, trouble swallowing and voice change.   Eyes: Negative for pain, discharge and visual disturbance.  Respiratory: Negative for cough, chest tightness, wheezing and stridor.   Cardiovascular: Negative for chest pain, palpitations and leg swelling.  Gastrointestinal: Negative for nausea, vomiting, abdominal pain, diarrhea, constipation, blood in stool and abdominal distention.  Genitourinary: Negative for urgency, hematuria, flank pain, discharge, difficulty urinating and genital sores.  Musculoskeletal: Positive for neck pain and neck stiffness. Negative for myalgias, back pain, joint swelling, arthralgias and gait problem.  Skin: Negative for rash.  Neurological: Negative for dizziness, syncope, speech difficulty, weakness, numbness and headaches.  Hematological: Negative for adenopathy. Does not bruise/bleed easily.  Psychiatric/Behavioral: Negative for behavioral problems and dysphoric mood. The patient is not nervous/anxious.        Objective:   Physical Exam  Constitutional: He appears well-developed and well-nourished. No  distress.  Neck:  Tenderness along the right lateral neck musculature.  Slight decreased range of motion.  Uncomfortable with head turning to the right  Neurological:  Normal grip strength Arm extension and flexion normal reflexes, normal           Assessment & Plan:   Cervical strain.  Doubt radiculopathy, but a consideration. Will treat with resting.  He has been asked to moderate his gym activities.  We'll place in a cervical collar.  Will treat with Depo-Medrol and a bedtime dose of Flexeril Aleve twice  daily Will call if unimproved

## 2014-10-20 NOTE — Progress Notes (Signed)
Pre visit review using our clinic review tool, if applicable. No additional management support is needed unless otherwise documented below in the visit note. 

## 2014-12-15 ENCOUNTER — Other Ambulatory Visit: Payer: Self-pay | Admitting: Internal Medicine

## 2015-01-05 ENCOUNTER — Telehealth: Payer: Self-pay | Admitting: Internal Medicine

## 2015-01-05 MED ORDER — HYDROCODONE-ACETAMINOPHEN 5-325 MG PO TABS: 1.0000 | ORAL_TABLET | Freq: Four times a day (QID) | ORAL | Status: DC | PRN

## 2015-01-05 NOTE — Telephone Encounter (Signed)
Pt needs new rx hydrocodone °

## 2015-01-05 NOTE — Telephone Encounter (Signed)
Left detailed message Rx ready for pickup. Rx printed and signed by Dr. Sherren Mocha.

## 2015-03-30 ENCOUNTER — Telehealth: Payer: Self-pay | Admitting: Internal Medicine

## 2015-03-30 MED ORDER — HYDROCODONE-ACETAMINOPHEN 5-325 MG PO TABS: 1.0000 | ORAL_TABLET | Freq: Four times a day (QID) | ORAL | Status: DC | PRN

## 2015-03-30 NOTE — Telephone Encounter (Signed)
Pt needs new rx hydrocodone °

## 2015-03-30 NOTE — Telephone Encounter (Signed)
Left message Rx ready for pickup. Rx printed and signed.  

## 2015-04-05 ENCOUNTER — Other Ambulatory Visit: Payer: Self-pay | Admitting: Internal Medicine

## 2015-04-07 ENCOUNTER — Other Ambulatory Visit: Payer: Self-pay | Admitting: Internal Medicine

## 2015-04-07 NOTE — Telephone Encounter (Signed)
Rx already sent.

## 2015-07-06 ENCOUNTER — Other Ambulatory Visit: Payer: Self-pay | Admitting: Internal Medicine

## 2015-07-06 NOTE — Telephone Encounter (Signed)
Pt need new rx hydrocodone. Pt would like to try 10-325 mg instead of 5-325 mg

## 2015-07-06 NOTE — Telephone Encounter (Signed)
Last rx filled on 7.7.2016. Pt last seen 1.28.2016.  Ok to switch strength?

## 2015-07-07 MED ORDER — HYDROCODONE-ACETAMINOPHEN 10-325 MG PO TABS: 1.0000 | ORAL_TABLET | Freq: Four times a day (QID) | ORAL | Status: DC | PRN

## 2015-07-07 NOTE — Telephone Encounter (Signed)
Called and spoke with pt and pt is aware.  

## 2015-07-07 NOTE — Telephone Encounter (Signed)
OK  #30  Needs f/u ROV

## 2015-07-21 ENCOUNTER — Encounter: Payer: Self-pay | Admitting: Internal Medicine

## 2015-07-21 ENCOUNTER — Ambulatory Visit (INDEPENDENT_AMBULATORY_CARE_PROVIDER_SITE_OTHER): Payer: BLUE CROSS/BLUE SHIELD | Admitting: Internal Medicine

## 2015-07-21 VITALS — BP 128/80 | HR 73 | Temp 98.3°F | Resp 20 | Ht 69.0 in | Wt 224.0 lb

## 2015-07-21 DIAGNOSIS — M542 Cervicalgia: Secondary | ICD-10-CM | POA: Diagnosis not present

## 2015-07-21 DIAGNOSIS — I10 Essential (primary) hypertension: Secondary | ICD-10-CM | POA: Diagnosis not present

## 2015-07-21 MED ORDER — AMLODIPINE BESYLATE 5 MG PO TABS: 5.0000 mg | ORAL_TABLET | Freq: Every day | ORAL | Status: DC

## 2015-07-21 NOTE — Progress Notes (Signed)
Pre visit review using our clinic review tool, if applicable. No additional management support is needed unless otherwise documented below in the visit note. 

## 2015-07-21 NOTE — Progress Notes (Signed)
Subjective:    Patient ID: [name], male    DOB: [birth year], 59 y.o.   MRN: [id number]  HPI  59 year old patient who has treated hypertension.  Complaints today include the intermittent right lateral neck pain.  He is also had some right lower quadrant discomfort at the site of the prior right inguinal hernia repair.  In general doing quite well.  He also complains of some chronic mild tinnitus and hearing loss  No past medical history on file.  Social History   Social History  . Marital Status: Legally Separated    Spouse Name: N/A  . Number of Children: N/A  . Years of Education: N/A   Occupational History  . Not on file.   Social History Main Topics  . Smoking status: Never Smoker   . Smokeless tobacco: Not on file  . Alcohol Use: No  . Drug Use: No  . Sexual Activity: Not on file   Other Topics Concern  . Not on file   Social History Narrative    Past Surgical History  Procedure Laterality Date  . Hernia repair      No family history on file.  Allergies  Allergen Reactions  . Lisinopril     Cough    Current Outpatient Prescriptions on File Prior to Visit  Medication Sig Dispense Refill  . amLODipine (NORVASC) 5 MG tablet TAKE 1 TABLET (5 MG TOTAL) BY MOUTH DAILY. 30 tablet 2  . cyclobenzaprine (FLEXERIL) 10 MG tablet Take 1 tablet (10 mg total) by mouth 3 (three) times daily as needed for muscle spasms. 30 tablet 0  . HYDROcodone-acetaminophen (NORCO) 10-325 MG tablet Take 1 tablet by mouth every 6 (six) hours as needed. 30 tablet 0  . ibuprofen (ADVIL,MOTRIN) 200 MG tablet Take 400 mg by mouth every 6 (six) hours as needed for pain.    Marland Kitchen losartan (COZAAR) 100 MG tablet TAKE 1 TABLET (100 MG TOTAL) BY MOUTH DAILY. 90 tablet 2  . Multiple Vitamins-Minerals (MENS 50+ MULTI VITAMIN/MIN PO) Take 1 tablet by mouth daily.     No current facility-administered medications on file prior to visit.    BP 128/80 mmHg  Pulse 73  Temp(Src) 98.3 F (36.8  C) (Oral)  Resp 20  Ht 5\' 9"  (1.753 m)  Wt 224 lb (101.606 kg)  BMI 33.06 kg/m2  SpO2 97%     Review of Systems  Constitutional: Negative for fever, chills, appetite change and fatigue.  HENT: Positive for hearing loss and tinnitus. Negative for congestion, dental problem, ear pain, sore throat, trouble swallowing and voice change.   Eyes: Negative for pain, discharge and visual disturbance.  Respiratory: Negative for cough, chest tightness, wheezing and stridor.   Cardiovascular: Negative for chest pain, palpitations and leg swelling.  Gastrointestinal: Positive for abdominal pain. Negative for nausea, vomiting, diarrhea, constipation, blood in stool and abdominal distention.  Genitourinary: Negative for urgency, hematuria, flank pain, discharge, difficulty urinating and genital sores.  Musculoskeletal: Positive for neck pain and neck stiffness. Negative for myalgias, back pain, joint swelling, arthralgias and gait problem.  Skin: Negative for rash.  Neurological: Negative for dizziness, syncope, speech difficulty, weakness, numbness and headaches.  Hematological: Negative for adenopathy. Does not bruise/bleed easily.  Psychiatric/Behavioral: Negative for behavioral problems and dysphoric mood. The patient is not nervous/anxious.        Objective:   Physical Exam  Constitutional: He is oriented to person, place, and time. He appears well-developed and well-nourished.  HENT:  Head: Normocephalic.  Right Ear: External ear normal.  Left Ear: External ear normal.  Eyes: Conjunctivae and EOM are normal.  Neck: Normal range of motion.  Mild right lateral neck pain with head turning to the left fairly full range of motion  Cardiovascular: Normal rate and normal heart sounds.   Pulmonary/Chest: Breath sounds normal.  Abdominal: Bowel sounds are normal.  Musculoskeletal: Normal range of motion. He exhibits no edema or tenderness.  Neurological: He is alert and oriented to person,  place, and time.  Psychiatric: He has a normal mood and affect. His behavior is normal.          Assessment & Plan:   Hypertension.  Excellent control.  All medications refilled Right lateral neck pain.  Appears to be more musculoligamentous.  Will try a cervical collar.  More vigorous heat, stretching and massage therapy.  We'll consider physical therapy referral if unimproved

## 2015-07-21 NOTE — Patient Instructions (Signed)
Cervical Sprain A cervical sprain is an injury in the neck in which the strong, fibrous tissues (ligaments) that connect your neck bones stretch or tear. Cervical sprains can range from mild to severe. Severe cervical sprains can cause the neck vertebrae to be unstable. This can lead to damage of the spinal cord and can result in serious nervous system problems. The amount of time it takes for a cervical sprain to get better depends on the cause and extent of the injury. Most cervical sprains heal in 1 to 3 weeks. CAUSES  Severe cervical sprains may be caused by:   Contact sport injuries (such as from football, rugby, wrestling, hockey, auto racing, gymnastics, diving, martial arts, or boxing).   Motor vehicle collisions.   Whiplash injuries. This is an injury from a sudden forward and backward whipping movement of the head and neck.  Falls.  Mild cervical sprains may be caused by:   Being in an awkward position, such as while cradling a telephone between your ear and shoulder.   Sitting in a chair that does not offer proper support.   Working at a poorly Landscape [employer] station.   Looking up or down for long periods of time.  SYMPTOMS   Pain, soreness, stiffness, or a burning sensation in the front, back, or sides of the neck. This discomfort may develop immediately after the injury or slowly, 24 hours or more after the injury.   Pain or tenderness directly in the middle of the back of the neck.   Shoulder or upper back pain.   Limited ability to move the neck.   Headache.   Dizziness.   Weakness, numbness, or tingling in the hands or arms.   Muscle spasms.   Difficulty swallowing or chewing.   Tenderness and swelling of the neck.  DIAGNOSIS  Most of the time your health care provider can diagnose a cervical sprain by taking your history and doing a physical exam. Your health care provider will ask about previous neck injuries and any known neck  problems, such as arthritis in the neck. X-rays may be taken to find out if there are any other problems, such as with the bones of the neck. Other tests, such as a CT scan or MRI, may also be needed.  TREATMENT  Treatment depends on the severity of the cervical sprain. Mild sprains can be treated with rest, keeping the neck in place (immobilization), and pain medicines. Severe cervical sprains are immediately immobilized. Further treatment is done to help with pain, muscle spasms, and other symptoms and may include:  Medicines, such as pain relievers, numbing medicines, or muscle relaxants.   Physical therapy. This may involve stretching exercises, strengthening exercises, and posture training. Exercises and improved posture can help stabilize the neck, strengthen muscles, and help stop symptoms from returning.  HOME CARE INSTRUCTIONS   Put ice on the injured area.   Put ice in a plastic bag.   Place a towel between your skin and the bag.   Leave the ice on for 15-20 minutes, 3-4 times a day.   If your injury was severe, you may have been given a cervical collar to wear. A cervical collar is a two-piece collar designed to keep your neck from moving while it heals.  Do not remove the collar unless instructed by your health care provider.  If you have long hair, keep it outside of the collar.  Ask your health care provider before making any adjustments to your collar. Minor  adjustments may be required over time to improve comfort and reduce pressure on your chin or on the back of your head.  Ifyou are allowed to remove the collar for cleaning or bathing, follow your health care provider's instructions on how to do so safely.  Keep your collar clean by wiping it with mild soap and water and drying it completely. If the collar you have been given includes removable pads, remove them every 1-2 days and hand wash them with soap and water. Allow them to air dry. They should be completely  dry before you wear them in the collar.  If you are allowed to remove the collar for cleaning and bathing, wash and dry the skin of your neck. Check your skin for irritation or sores. If you see any, tell your health care provider.  Do not drive while wearing the collar.   Only take over-the-counter or prescription medicines for pain, discomfort, or fever as directed by your health care provider.   Keep all follow-up appointments as directed by your health care provider.   Keep all physical therapy appointments as directed by your health care provider.   Make any needed adjustments to your workstation to promote good posture.   Avoid positions and activities that make your symptoms worse.   Warm up and stretch before being active to help prevent problems.  SEEK MEDICAL CARE IF:   Your pain is not controlled with medicine.   You are unable to decrease your pain medicine over time as planned.   Your activity level is not improving as expected.  SEEK IMMEDIATE MEDICAL CARE IF:   You develop any bleeding.  You develop stomach upset.  You have signs of an allergic reaction to your medicine.   Your symptoms get worse.   You develop new, unexplained symptoms.   You have numbness, tingling, weakness, or paralysis in any part of your body.  MAKE SURE YOU:   Understand these instructions.  Will watch your condition.  Will get help right away if you are not doing well or get worse.   This information is not intended to replace advice given to you by your health care provider. Make sure you discuss any questions you have with your health care provider.   Document Released: 07/07/2007 Document Revised: 09/14/2013 Document Reviewed: 03/17/2013 Elsevier Interactive Patient Education 2016 Jansen your sodium (Salt) intake  Please check your blood pressure on a regular basis.  If it is consistently greater than 150/90, please make an office  appointment.    It is important that you exercise regularly, at least 20 minutes 3 to 4 times per week.  If you develop chest pain or shortness of breath seek  medical attention.  Return in 6 months for follow-up

## 2015-08-14 ENCOUNTER — Telehealth: Payer: Self-pay | Admitting: Internal Medicine

## 2015-08-14 NOTE — Telephone Encounter (Signed)
Pt requesting refill of HYDROcodone-acetaminophen (NORCO) 10-325 MG tablet.  States he was only given # 30 last time and Dr.K agreed to increase to #60 when it was time to refill.  I explained to patient that Dr. Raliegh Ip was out of the office this week and this request would have to be approved by another provider who may/may not agree to increase quantity.  Pt stated he understood.

## 2015-08-15 MED ORDER — HYDROCODONE-ACETAMINOPHEN 10-325 MG PO TABS: 1.0000 | ORAL_TABLET | Freq: Four times a day (QID) | ORAL | Status: DC | PRN

## 2015-08-15 NOTE — Telephone Encounter (Signed)
Spoke to pt, told him Rx ready for pickup and was given 60 tablets, should of had 60 last time sorry about that. Pt verbalized understanding. Rx printed and signed by Dr. Sherren Mocha.

## 2015-08-16 ENCOUNTER — Other Ambulatory Visit: Payer: Self-pay | Admitting: Internal Medicine

## 2015-10-31 ENCOUNTER — Telehealth: Payer: Self-pay | Admitting: Internal Medicine

## 2015-10-31 MED ORDER — HYDROCODONE-ACETAMINOPHEN 10-325 MG PO TABS: 1.0000 | ORAL_TABLET | Freq: Four times a day (QID) | ORAL | Status: DC | PRN

## 2015-10-31 NOTE — Telephone Encounter (Signed)
° ° °  Pt request refill of the following: ° ° °HYDROcodone-acetaminophen (NORCO) 10-325 MG tablet ° ° °Phamacy: °

## 2015-10-31 NOTE — Telephone Encounter (Signed)
Pt notified Rx ready for pickup. Rx printed and signed.  

## 2015-11-02 ENCOUNTER — Telehealth: Payer: Self-pay | Admitting: Internal Medicine

## 2015-11-02 NOTE — Telephone Encounter (Signed)
Noted  

## 2015-11-02 NOTE — Telephone Encounter (Signed)
Pt needed to come in at 4:15 so I used a SDA for tues 11/07/15

## 2015-11-07 ENCOUNTER — Encounter: Payer: Self-pay | Admitting: Internal Medicine

## 2015-11-07 ENCOUNTER — Ambulatory Visit (INDEPENDENT_AMBULATORY_CARE_PROVIDER_SITE_OTHER): Payer: BLUE CROSS/BLUE SHIELD | Admitting: Internal Medicine

## 2015-11-07 VITALS — BP 122/80 | HR 71 | Temp 98.3°F | Resp 20 | Ht 69.0 in | Wt 226.0 lb

## 2015-11-07 DIAGNOSIS — R8290 Unspecified abnormal findings in urine: Secondary | ICD-10-CM

## 2015-11-07 DIAGNOSIS — M1000 Idiopathic gout, unspecified site: Secondary | ICD-10-CM

## 2015-11-07 DIAGNOSIS — I10 Essential (primary) hypertension: Secondary | ICD-10-CM

## 2015-11-07 DIAGNOSIS — M109 Gout, unspecified: Secondary | ICD-10-CM

## 2015-11-07 LAB — POC URINALSYSI DIPSTICK (AUTOMATED)
Bilirubin, UA: NEGATIVE
Blood, UA: NEGATIVE
Glucose, UA: NEGATIVE
Ketones, UA: NEGATIVE
NITRITE UA: NEGATIVE
PROTEIN UA: NEGATIVE
SPEC GRAV UA: 1.02
UROBILINOGEN UA: 0.2
pH, UA: 6

## 2015-11-07 NOTE — Progress Notes (Signed)
Pre visit review using our clinic review tool, if applicable. No additional management support is needed unless otherwise documented below in the visit note. 

## 2015-11-07 NOTE — Patient Instructions (Signed)
Drink as much fluid as you  can tolerate over the next few days  Report any new or worsening symptoms  Call if you develop any pain or burning with urination  Please check your blood pressure on a regular basis.  If it is consistently greater than 150/90, please make an office appointment.  Return in 6 months for follow-up

## 2015-11-07 NOTE — Progress Notes (Signed)
Subjective:    Patient ID: [name], male    DOB: September 20, 1956, 60 y.o.   MRN: [id number]  HPI   60 year old patient who has essential hypertension and a history of gout.  He presents with a one-week history of change in the quality of his urine.  He describes a much more darker color and a more prominent odor.  More recently the urine seems to have improved.  He denies any dysuria, urinary frequency, urethral discharge. Urinalysis reveals moderate pyuria which has been chronic with prior specimens  No past medical history on file.  Social History   Social History  . Marital Status: Legally Separated    Spouse Name: N/A  . Number of Children: N/A  . Years of Education: N/A   Occupational History  . Not on file.   Social History Main Topics  . Smoking status: Never Smoker   . Smokeless tobacco: Not on file  . Alcohol Use: No  . Drug Use: No  . Sexual Activity: Not on file   Other Topics Concern  . Not on file   Social History Narrative    Past Surgical History  Procedure Laterality Date  . Hernia repair      No family history on file.  Allergies  Allergen Reactions  . Lisinopril     Cough    Current Outpatient Prescriptions on File Prior to Visit  Medication Sig Dispense Refill  . amLODipine (NORVASC) 5 MG tablet Take 1 tablet (5 mg total) by mouth daily. 90 tablet 2  . cyclobenzaprine (FLEXERIL) 10 MG tablet Take 1 tablet (10 mg total) by mouth 3 (three) times daily as needed for muscle spasms. 30 tablet 0  . HYDROcodone-acetaminophen (NORCO) 10-325 MG tablet Take 1 tablet by mouth every 6 (six) hours as needed. 60 tablet 0  . ibuprofen (ADVIL,MOTRIN) 200 MG tablet Take 400 mg by mouth every 6 (six) hours as needed for pain.    Marland Kitchen losartan (COZAAR) 100 MG tablet TAKE 1 TABLET (100 MG TOTAL) BY MOUTH DAILY. 90 tablet 1  . Multiple Vitamins-Minerals (MENS 50+ MULTI VITAMIN/MIN PO) Take 1 tablet by mouth daily.     No current facility-administered  medications on file prior to visit.    BP 122/80 mmHg  Pulse 71  Temp(Src) 98.3 F (36.8 C) (Oral)  Resp 20  Ht 5\' 9"  (1.753 m)  Wt 226 lb (102.513 kg)  BMI 33.36 kg/m2  SpO2 96%     Review of Systems  Constitutional: Negative for fever, chills, appetite change and fatigue.  HENT: Negative for congestion, dental problem, ear pain, hearing loss, sore throat, tinnitus, trouble swallowing and voice change.   Eyes: Negative for pain, discharge and visual disturbance.  Respiratory: Negative for cough, chest tightness, wheezing and stridor.   Cardiovascular: Negative for chest pain, palpitations and leg swelling.  Gastrointestinal: Negative for nausea, vomiting, abdominal pain, diarrhea, constipation, blood in stool and abdominal distention.  Genitourinary: Negative for dysuria, urgency, hematuria, flank pain, discharge, difficulty urinating and genital sores.  Musculoskeletal: Negative for myalgias, back pain, joint swelling, arthralgias, gait problem and neck stiffness.  Skin: Negative for rash.  Neurological: Negative for dizziness, syncope, speech difficulty, weakness, numbness and headaches.  Hematological: Negative for adenopathy. Does not bruise/bleed easily.  Psychiatric/Behavioral: Negative for behavioral problems and dysphoric mood. The patient is not nervous/anxious.        Objective:   Physical Exam  Constitutional: He appears well-developed and well-nourished. No distress.  Cardiovascular: Normal rate and  regular rhythm.   Musculoskeletal: He exhibits no edema.          Assessment & Plan:  Hypertension, well-controlled Asymptomatic pyuria.  He feels his urine is normalizing and he remains asymptomatic.  Will check a urine C&S, but not treat at this time

## 2015-11-09 LAB — URINE CULTURE

## 2015-12-25 ENCOUNTER — Encounter (HOSPITAL_COMMUNITY): Payer: Self-pay | Admitting: Nurse Practitioner

## 2015-12-25 ENCOUNTER — Inpatient Hospital Stay (HOSPITAL_COMMUNITY)
Admission: EM | Admit: 2015-12-25 | Discharge: 2016-01-01 | DRG: 871 | Disposition: A | Discharge status: Home Health | Payer: BLUE CROSS/BLUE SHIELD | Attending: Family Medicine | Admitting: Family Medicine

## 2015-12-25 DIAGNOSIS — R51 Headache: Secondary | ICD-10-CM | POA: Diagnosis not present

## 2015-12-25 DIAGNOSIS — I10 Essential (primary) hypertension: Secondary | ICD-10-CM | POA: Diagnosis not present

## 2015-12-25 DIAGNOSIS — K449 Diaphragmatic hernia without obstruction or gangrene: Secondary | ICD-10-CM | POA: Diagnosis present

## 2015-12-25 DIAGNOSIS — B9620 Unspecified Escherichia coli [E. coli] as the cause of diseases classified elsewhere: Secondary | ICD-10-CM | POA: Diagnosis present

## 2015-12-25 DIAGNOSIS — G8929 Other chronic pain: Secondary | ICD-10-CM | POA: Diagnosis present

## 2015-12-25 DIAGNOSIS — A419 Sepsis, unspecified organism: Secondary | ICD-10-CM | POA: Diagnosis not present

## 2015-12-25 DIAGNOSIS — I129 Hypertensive chronic kidney disease with stage 1 through stage 4 chronic kidney disease, or unspecified chronic kidney disease: Secondary | ICD-10-CM | POA: Diagnosis present

## 2015-12-25 DIAGNOSIS — N179 Acute kidney failure, unspecified: Secondary | ICD-10-CM | POA: Diagnosis not present

## 2015-12-25 DIAGNOSIS — M545 Low back pain: Secondary | ICD-10-CM | POA: Diagnosis present

## 2015-12-25 DIAGNOSIS — N451 Epididymitis: Secondary | ICD-10-CM

## 2015-12-25 DIAGNOSIS — E872 Acidosis: Secondary | ICD-10-CM | POA: Diagnosis present

## 2015-12-25 DIAGNOSIS — N453 Epididymo-orchitis: Secondary | ICD-10-CM | POA: Diagnosis present

## 2015-12-25 DIAGNOSIS — D696 Thrombocytopenia, unspecified: Secondary | ICD-10-CM | POA: Diagnosis present

## 2015-12-25 DIAGNOSIS — N452 Orchitis: Secondary | ICD-10-CM | POA: Insufficient documentation

## 2015-12-25 DIAGNOSIS — Z87440 Personal history of urinary (tract) infections: Secondary | ICD-10-CM | POA: Diagnosis not present

## 2015-12-25 DIAGNOSIS — Z888 Allergy status to other drugs, medicaments and biological substances status: Secondary | ICD-10-CM

## 2015-12-25 DIAGNOSIS — N12 Tubulo-interstitial nephritis, not specified as acute or chronic: Secondary | ICD-10-CM

## 2015-12-25 DIAGNOSIS — R519 Headache, unspecified: Secondary | ICD-10-CM | POA: Insufficient documentation

## 2015-12-25 DIAGNOSIS — N50812 Left testicular pain: Secondary | ICD-10-CM | POA: Diagnosis not present

## 2015-12-25 DIAGNOSIS — N390 Urinary tract infection, site not specified: Secondary | ICD-10-CM

## 2015-12-25 DIAGNOSIS — K5730 Diverticulosis of large intestine without perforation or abscess without bleeding: Secondary | ICD-10-CM | POA: Diagnosis present

## 2015-12-25 DIAGNOSIS — R509 Fever, unspecified: Secondary | ICD-10-CM | POA: Diagnosis not present

## 2015-12-25 DIAGNOSIS — A4151 Sepsis due to Escherichia coli [E. coli]: Secondary | ICD-10-CM | POA: Diagnosis not present

## 2015-12-25 DIAGNOSIS — R17 Unspecified jaundice: Secondary | ICD-10-CM

## 2015-12-25 DIAGNOSIS — D649 Anemia, unspecified: Secondary | ICD-10-CM | POA: Diagnosis present

## 2015-12-25 DIAGNOSIS — R309 Painful micturition, unspecified: Secondary | ICD-10-CM | POA: Diagnosis not present

## 2015-12-25 DIAGNOSIS — R338 Other retention of urine: Secondary | ICD-10-CM | POA: Diagnosis present

## 2015-12-25 DIAGNOSIS — N358 Other urethral stricture: Secondary | ICD-10-CM | POA: Diagnosis not present

## 2015-12-25 DIAGNOSIS — N182 Chronic kidney disease, stage 2 (mild): Secondary | ICD-10-CM | POA: Diagnosis present

## 2015-12-25 DIAGNOSIS — N3289 Other specified disorders of bladder: Secondary | ICD-10-CM | POA: Diagnosis not present

## 2015-12-25 DIAGNOSIS — J9600 Acute respiratory failure, unspecified whether with hypoxia or hypercapnia: Secondary | ICD-10-CM | POA: Diagnosis present

## 2015-12-25 DIAGNOSIS — N5082 Scrotal pain: Secondary | ICD-10-CM

## 2015-12-25 DIAGNOSIS — Z79899 Other long term (current) drug therapy: Secondary | ICD-10-CM

## 2015-12-25 DIAGNOSIS — N433 Hydrocele, unspecified: Secondary | ICD-10-CM | POA: Diagnosis not present

## 2015-12-25 DIAGNOSIS — R109 Unspecified abdominal pain: Secondary | ICD-10-CM | POA: Diagnosis not present

## 2015-12-25 DIAGNOSIS — N359 Urethral stricture, unspecified: Secondary | ICD-10-CM | POA: Diagnosis not present

## 2015-12-25 DIAGNOSIS — E876 Hypokalemia: Secondary | ICD-10-CM | POA: Diagnosis present

## 2015-12-25 HISTORY — DX: Essential (primary) hypertension: I10

## 2015-12-25 HISTORY — DX: Sepsis, unspecified organism: A41.9

## 2015-12-25 HISTORY — DX: Personal history of other diseases of the musculoskeletal system and connective tissue: Z87.39

## 2015-12-25 LAB — URINE MICROSCOPIC-ADD ON

## 2015-12-25 LAB — CBC
HCT: 42 % (ref 39.0–52.0)
Hemoglobin: 14.6 g/dL (ref 13.0–17.0)
MCH: 29 pg (ref 26.0–34.0)
MCHC: 34.8 g/dL (ref 30.0–36.0)
MCV: 83.3 fL (ref 78.0–100.0)
PLATELETS: 148 10*3/uL — AB (ref 150–400)
RBC: 5.04 MIL/uL (ref 4.22–5.81)
RDW: 14.5 % (ref 11.5–15.5)
WBC: 9 10*3/uL (ref 4.0–10.5)

## 2015-12-25 LAB — URINALYSIS, ROUTINE W REFLEX MICROSCOPIC
BILIRUBIN URINE: NEGATIVE
GLUCOSE, UA: NEGATIVE mg/dL
KETONES UR: NEGATIVE mg/dL
Nitrite: NEGATIVE
PH: 6 (ref 5.0–8.0)
PROTEIN: NEGATIVE mg/dL
Specific Gravity, Urine: 1.019 (ref 1.005–1.030)

## 2015-12-25 LAB — COMPREHENSIVE METABOLIC PANEL
ALBUMIN: 4.4 g/dL (ref 3.5–5.0)
ALT: 33 U/L (ref 17–63)
AST: 32 U/L (ref 15–41)
Alkaline Phosphatase: 36 U/L — ABNORMAL LOW (ref 38–126)
Anion gap: 13 (ref 5–15)
BILIRUBIN TOTAL: 0.8 mg/dL (ref 0.3–1.2)
BUN: 16 mg/dL (ref 6–20)
CHLORIDE: 104 mmol/L (ref 101–111)
CO2: 20 mmol/L — ABNORMAL LOW (ref 22–32)
CREATININE: 1.41 mg/dL — AB (ref 0.61–1.24)
Calcium: 9.6 mg/dL (ref 8.9–10.3)
GFR calc Af Amer: >60 mL/min (ref >60)
GFR, EST NON AFRICAN AMERICAN: 53 mL/min — AB (ref >60)
GLUCOSE: 89 mg/dL (ref 65–99)
Potassium: 4 mmol/L (ref 3.5–5.1)
Sodium: 137 mmol/L (ref 135–145)
TOTAL PROTEIN: 8.4 g/dL — AB (ref 6.5–8.1)

## 2015-12-25 LAB — I-STAT CG4 LACTIC ACID, ED: Lactic Acid, Venous: 3.29 mmol/L (ref 0.5–2.0)

## 2015-12-25 MED ORDER — CEFTRIAXONE SODIUM 2 G IJ SOLR
2.0000 g | Freq: Once | INTRAMUSCULAR | Status: AC
  Administered 2015-12-25: 2 g via INTRAVENOUS
  Filled 2015-12-25: qty 2

## 2015-12-25 MED ORDER — SODIUM CHLORIDE 0.9 % IV BOLUS (SEPSIS)
1000.0000 mL | INTRAVENOUS | Status: AC
  Administered 2015-12-25 – 2015-12-26 (×3): 1000 mL via INTRAVENOUS

## 2015-12-25 MED ORDER — SODIUM CHLORIDE 0.9 % IV BOLUS (SEPSIS)
1000.0000 mL | Freq: Once | INTRAVENOUS | Status: AC
  Administered 2015-12-25: 1000 mL via INTRAVENOUS

## 2015-12-25 MED ORDER — SODIUM CHLORIDE 0.9 % IV BOLUS (SEPSIS)
500.0000 mL | INTRAVENOUS | Status: DC
  Administered 2015-12-26: 500 mL via INTRAVENOUS

## 2015-12-25 MED ORDER — ONDANSETRON HCL 4 MG/2ML IJ SOLN
4.0000 mg | Freq: Once | INTRAMUSCULAR | Status: AC
  Administered 2015-12-25: 4 mg via INTRAVENOUS
  Filled 2015-12-25: qty 2

## 2015-12-25 MED ORDER — SODIUM CHLORIDE 0.9 % IV SOLN: INTRAVENOUS | Status: DC

## 2015-12-25 MED ORDER — MORPHINE SULFATE (PF) 4 MG/ML IV SOLN
4.0000 mg | Freq: Once | INTRAVENOUS | Status: AC
  Administered 2015-12-25: 4 mg via INTRAVENOUS
  Filled 2015-12-25: qty 1

## 2015-12-25 MED ORDER — DEXTROSE 5 % IV SOLN: 1.0000 g | INTRAVENOUS | Status: DC

## 2015-12-25 NOTE — ED Notes (Signed)
He reports onset abd pain, shakes and chills, not feeling well this morning. Symptoms have been persistent since onset. He feels like L testicle is swollen. He denies any bowel/bladder changes

## 2015-12-25 NOTE — ED Provider Notes (Addendum)
CSN: [id number]     Arrival date & time 12/25/15  1501 History   First MD Initiated Contact with Patient 12/25/15 2024     Chief Complaint  Patient presents with  . Fever     (Consider location/radiation/quality/duration/timing/severity/associated sxs/prior Treatment) Patient is a 60 y.o. male presenting with fever. The history is provided by the patient.  Fever Associated symptoms: chills and nausea   Associated symptoms: no chest pain, no confusion, no cough, no dysuria, no headaches, no rash, no sore throat and no vomiting   Patient indicates onset fever/chills today.  Pt notes recent odor to urine. Hx uti. Nausea, no vomiting. No diarrhea. abd pain, lower abd, moderate, cramping, comes and goes. C/o left testicle swollen.  States felt similar to when had uti in past.  With above symptoms, denies specific exacerbating or alleviating factors.      Past Medical History  Diagnosis Date  . Hypertension    Past Surgical History  Procedure Laterality Date  . Hernia repair     History reviewed. No pertinent family history. Social History  Substance Use Topics  . Smoking status: Never Smoker   . Smokeless tobacco: None  . Alcohol Use: No    Review of Systems  Constitutional: Positive for fever and chills.  HENT: Negative for sore throat.   Eyes: Negative for redness.  Respiratory: Negative for cough and shortness of breath.   Cardiovascular: Negative for chest pain.  Gastrointestinal: Positive for nausea. Negative for vomiting and abdominal pain.  Genitourinary: Positive for scrotal swelling. Negative for dysuria and flank pain.  Musculoskeletal: Negative for back pain and neck pain.  Skin: Negative for rash.  Neurological: Negative for headaches.  Hematological: Does not bruise/bleed easily.  Psychiatric/Behavioral: Negative for confusion.      Allergies  Lisinopril  Home Medications   Prior to Admission medications   Medication Sig Start Date End Date Taking?  Authorizing Provider  amLODipine (NORVASC) 5 MG tablet Take 1 tablet (5 mg total) by mouth daily. 07/21/15   Marletta Lor, MD  cyclobenzaprine (FLEXERIL) 10 MG tablet Take 1 tablet (10 mg total) by mouth 3 (three) times daily as needed for muscle spasms. 10/20/14   Marletta Lor, MD  HYDROcodone-acetaminophen [facility]) 10-325 MG tablet Take 1 tablet by mouth every 6 (six) hours as needed. 10/31/15   Marletta Lor, MD  ibuprofen (ADVIL,MOTRIN) 200 MG tablet Take 400 mg by mouth every 6 (six) hours as needed for pain.    Historical Provider, MD  losartan (COZAAR) 100 MG tablet TAKE 1 TABLET (100 MG TOTAL) BY MOUTH DAILY. 08/16/15   Marletta Lor, MD  Multiple Vitamins-Minerals (MENS 50+ MULTI VITAMIN/MIN PO) Take 1 tablet by mouth daily.    Historical Provider, MD   BP 134/82 mmHg  Pulse 94  Temp(Src) 100.7 F (38.2 C) (Oral)  Resp 20  SpO2 100% Physical Exam  Constitutional: He is oriented to person, place, and time. He appears well-developed and well-nourished. No distress.  febrile  HENT:  Head: Atraumatic.  Mouth/Throat: Oropharynx is clear and moist.  Eyes: Conjunctivae are normal. No scleral icterus.  Neck: Neck supple. No tracheal deviation present.  Cardiovascular: Normal rate, regular rhythm, normal heart sounds and intact distal pulses.  Exam reveals no gallop and no friction rub.   No murmur heard. Pulmonary/Chest: Effort normal and breath sounds normal. No accessory muscle usage. No respiratory distress.  Abdominal: Soft. Bowel sounds are normal. He exhibits no distension and no mass. There is no  tenderness. There is no rebound and no guarding.  Genitourinary:  No cva tenderness. Left testis mild swelling, left epididymus and testicle tenderness.   Musculoskeletal: Normal range of motion. He exhibits no edema or tenderness.  Neurological: He is alert and oriented to person, place, and time.  Skin: Skin is warm and dry. No rash noted. He is not diaphoretic.   Psychiatric: He has a normal mood and affect.  Nursing note and vitals reviewed.   ED Course  Procedures (including critical care time) Labs Review  Results for orders placed or performed during the hospital encounter of 12/25/15  Comprehensive metabolic panel  Result Value Ref Range   Sodium 137 135 - 145 mmol/L   Potassium 4.0 3.5 - 5.1 mmol/L   Chloride 104 101 - 111 mmol/L   CO2 20 (L) 22 - 32 mmol/L   Glucose, Bld 89 65 - 99 mg/dL   BUN 16 6 - 20 mg/dL   Creatinine, Ser 1.41 (H) 0.61 - 1.24 mg/dL   Calcium 9.6 8.9 - 10.3 mg/dL   Total Protein 8.4 (H) 6.5 - 8.1 g/dL   Albumin 4.4 3.5 - 5.0 g/dL   AST 32 15 - 41 U/L   ALT 33 17 - 63 U/L   Alkaline Phosphatase 36 (L) 38 - 126 U/L   Total Bilirubin 0.8 0.3 - 1.2 mg/dL   GFR calc non Af Amer 53 (L) >60 mL/min   GFR calc Af Amer >60 >60 mL/min   Anion gap 13 5 - 15  CBC  Result Value Ref Range   WBC 9.0 4.0 - 10.5 K/uL   RBC 5.04 4.22 - 5.81 MIL/uL   Hemoglobin 14.6 13.0 - 17.0 g/dL   HCT 42.0 39.0 - 52.0 %   MCV 83.3 78.0 - 100.0 fL   MCH 29.0 26.0 - 34.0 pg   MCHC 34.8 30.0 - 36.0 g/dL   RDW 14.5 11.5 - 15.5 %   Platelets 148 (L) 150 - 400 K/uL  Urinalysis, Routine w reflex microscopic (not at [facility])  Result Value Ref Range   Color, Urine YELLOW YELLOW   APPearance TURBID (A) CLEAR   Specific Gravity, Urine 1.019 1.005 - 1.030   pH 6.0 5.0 - 8.0   Glucose, UA NEGATIVE NEGATIVE mg/dL   Hgb urine dipstick MODERATE (A) NEGATIVE   Bilirubin Urine NEGATIVE NEGATIVE   Ketones, ur NEGATIVE NEGATIVE mg/dL   Protein, ur NEGATIVE NEGATIVE mg/dL   Nitrite NEGATIVE NEGATIVE   Leukocytes, UA LARGE (A) NEGATIVE  Urine microscopic-add on  Result Value Ref Range   Squamous Epithelial / LPF 0-5 (A) NONE SEEN   WBC, UA TOO NUMEROUS TO COUNT 0 - 5 WBC/hpf   RBC / HPF 6-30 0 - 5 RBC/hpf   Bacteria, UA MANY (A) NONE SEEN  I-Stat CG4 Lactic Acid, ED  Result Value Ref Range   Lactic Acid, Venous 3.29 (HH) 0.5 - 2.0 mmol/L    Comment NOTIFIED PHYSICIAN        I have personally reviewed and evaluated these images and lab results as part of my medical decision-making.    MDM   Patient c/o onset fever today.  Reviewed nursing notes and prior charts for additional history.   Blood and urine cultures sent.  Iv abx.  Iv ns bolus.   Exam/labs c/w uti, epididymitis.  Discussed pt with hospitalist, Dr Hal Hope - requests temp orders to tele bed.   Recheck bp normal. Pt alert, more comfortable appearing.  Lajean Saver, MD 12/25/15 2306

## 2015-12-25 NOTE — Progress Notes (Signed)
Pharmacy Code Sepsis Protocol  Time of code sepsis page: 2141 [x]  Antibiotics delivered at 2145 []  Antibiotics administered prior to code at  (if checked, omit next 2 questions)  Were antibiotics ordered at the time of the code sepsis page? Yes Was it required to contact the physician? [x]  Physician not contacted []  Physician contacted to order antibiotics for code sepsis []  Physician contacted to recommend changing antibiotics  Anti-infectives    Start     Dose/Rate Route Frequency Ordered Stop   12/26/15 2200  cefTRIAXone (ROCEPHIN) 1 g in dextrose 5 % 50 mL IVPB     1 g 100 mL/hr over 30 Minutes Intravenous Every 24 hours 12/25/15 2140     12/25/15 2145  cefTRIAXone (ROCEPHIN) 2 g in dextrose 5 % 50 mL IVPB     2 g 100 mL/hr over 30 Minutes Intravenous  Once 12/25/15 2139          Nurse education provided: [x]  Minutes left to administer antibiotics to achieve 1 hour goal []  Correct order of antibiotic administration []  Antibiotic Y-site compatibilities     Laurabeth Yip, Rande Lawman, PharmD 12/25/2015, 9:45 PM

## 2015-12-26 ENCOUNTER — Inpatient Hospital Stay (HOSPITAL_COMMUNITY): Payer: BLUE CROSS/BLUE SHIELD

## 2015-12-26 ENCOUNTER — Encounter (HOSPITAL_COMMUNITY): Payer: Self-pay | Admitting: Internal Medicine

## 2015-12-26 DIAGNOSIS — N390 Urinary tract infection, site not specified: Secondary | ICD-10-CM | POA: Insufficient documentation

## 2015-12-26 DIAGNOSIS — A419 Sepsis, unspecified organism: Secondary | ICD-10-CM | POA: Diagnosis present

## 2015-12-26 LAB — LACTIC ACID, PLASMA
LACTIC ACID, VENOUS: 2.7 mmol/L — AB (ref 0.5–2.0)
LACTIC ACID, VENOUS: 3.5 mmol/L — AB (ref 0.5–2.0)
Lactic Acid, Venous: 1.4 mmol/L (ref 0.5–2.0)
Lactic Acid, Venous: 3.3 mmol/L (ref 0.5–2.0)

## 2015-12-26 LAB — I-STAT ARTERIAL BLOOD GAS, ED
Acid-base deficit: 6 mmol/L — ABNORMAL HIGH (ref 0.0–2.0)
BICARBONATE: 17.2 meq/L — AB (ref 20.0–24.0)
O2 SAT: 94 %
PO2 ART: 75 mmHg — AB (ref 80.0–100.0)
Patient temperature: 102.1
TCO2: 18 mmol/L (ref 0–100)
pCO2 arterial: 28.8 mmHg — ABNORMAL LOW (ref 35.0–45.0)
pH, Arterial: 7.393 (ref 7.350–7.450)

## 2015-12-26 LAB — COMPREHENSIVE METABOLIC PANEL
ALBUMIN: 3.1 g/dL — AB (ref 3.5–5.0)
ALT: 23 U/L (ref 17–63)
ANION GAP: 10 (ref 5–15)
AST: 25 U/L (ref 15–41)
Alkaline Phosphatase: 29 U/L — ABNORMAL LOW (ref 38–126)
BILIRUBIN TOTAL: 0.7 mg/dL (ref 0.3–1.2)
BUN: 17 mg/dL (ref 6–20)
CO2: 20 mmol/L — ABNORMAL LOW (ref 22–32)
Calcium: 8 mg/dL — ABNORMAL LOW (ref 8.9–10.3)
Chloride: 107 mmol/L (ref 101–111)
Creatinine, Ser: 1.45 mg/dL — ABNORMAL HIGH (ref 0.61–1.24)
GFR calc Af Amer: 60 mL/min — ABNORMAL LOW (ref >60)
GFR, EST NON AFRICAN AMERICAN: 52 mL/min — AB (ref >60)
Glucose, Bld: 99 mg/dL (ref 65–99)
POTASSIUM: 3.6 mmol/L (ref 3.5–5.1)
Sodium: 137 mmol/L (ref 135–145)
TOTAL PROTEIN: 6.3 g/dL — AB (ref 6.5–8.1)

## 2015-12-26 LAB — CBC WITH DIFFERENTIAL/PLATELET
BAND NEUTROPHILS: 2 %
BASOS ABS: 0 10*3/uL (ref 0.0–0.1)
BASOS PCT: 0 %
BLASTS: 0 %
EOS ABS: 0 10*3/uL (ref 0.0–0.7)
Eosinophils Relative: 0 %
HEMATOCRIT: 35.8 % — AB (ref 39.0–52.0)
Hemoglobin: 12.3 g/dL — ABNORMAL LOW (ref 13.0–17.0)
LYMPHS PCT: 23 %
Lymphs Abs: 2 10*3/uL (ref 0.7–4.0)
MCH: 28.7 pg (ref 26.0–34.0)
MCHC: 34.4 g/dL (ref 30.0–36.0)
MCV: 83.6 fL (ref 78.0–100.0)
METAMYELOCYTES PCT: 0 %
MONO ABS: 0.5 10*3/uL (ref 0.1–1.0)
Monocytes Relative: 6 %
Myelocytes: 0 %
NEUTROS ABS: 5.8 10*3/uL (ref 1.7–7.7)
Neutrophils Relative %: 65 %
OTHER: 4 %
PLATELETS: 117 10*3/uL — AB (ref 150–400)
PROMYELOCYTES ABS: 0 %
RBC: 4.28 MIL/uL (ref 4.22–5.81)
RDW: 14.7 % (ref 11.5–15.5)
WBC: 8.6 10*3/uL (ref 4.0–10.5)
nRBC: 0 /100 WBC

## 2015-12-26 LAB — I-STAT CG4 LACTIC ACID, ED: LACTIC ACID, VENOUS: 2.76 mmol/L — AB (ref 0.5–2.0)

## 2015-12-26 LAB — PROCALCITONIN: PROCALCITONIN: 0.6 ng/mL

## 2015-12-26 LAB — PATHOLOGIST SMEAR REVIEW: Path Review: REACTIVE

## 2015-12-26 LAB — GC/CHLAMYDIA PROBE AMP (~~LOC~~) NOT AT ARMC
Chlamydia: NEGATIVE
Neisseria Gonorrhea: NEGATIVE

## 2015-12-26 LAB — PROTIME-INR
INR: 1.27 (ref 0.00–1.49)
PROTHROMBIN TIME: 16.1 s — AB (ref 11.6–15.2)

## 2015-12-26 LAB — RPR: RPR Ser Ql: NONREACTIVE

## 2015-12-26 LAB — HIV ANTIBODY (ROUTINE TESTING W REFLEX): HIV Screen 4th Generation wRfx: NONREACTIVE

## 2015-12-26 LAB — APTT: APTT: 31 s (ref 24–37)

## 2015-12-26 MED ORDER — LOSARTAN POTASSIUM 50 MG PO TABS
100.0000 mg | ORAL_TABLET | Freq: Every day | ORAL | Status: DC
  Filled 2015-12-26: qty 2

## 2015-12-26 MED ORDER — ONDANSETRON HCL 4 MG PO TABS
4.0000 mg | ORAL_TABLET | Freq: Four times a day (QID) | ORAL | Status: DC | PRN
  Administered 2015-12-27 – 2015-12-28 (×2): 4 mg via ORAL
  Filled 2015-12-26 (×2): qty 1

## 2015-12-26 MED ORDER — SODIUM CHLORIDE 0.9 % IV BOLUS (SEPSIS)
1000.0000 mL | Freq: Once | INTRAVENOUS | Status: AC
  Administered 2015-12-26: 1000 mL via INTRAVENOUS

## 2015-12-26 MED ORDER — MORPHINE SULFATE (PF) 2 MG/ML IV SOLN
2.0000 mg | INTRAVENOUS | Status: DC | PRN
  Administered 2015-12-26 – 2015-12-30 (×5): 2 mg via INTRAVENOUS
  Filled 2015-12-26 (×5): qty 1

## 2015-12-26 MED ORDER — ONDANSETRON HCL 4 MG/2ML IJ SOLN
4.0000 mg | Freq: Four times a day (QID) | INTRAMUSCULAR | Status: DC | PRN
  Administered 2015-12-26 – 2015-12-31 (×9): 4 mg via INTRAVENOUS
  Filled 2015-12-26 (×10): qty 2

## 2015-12-26 MED ORDER — SODIUM CHLORIDE 0.9 % IV BOLUS (SEPSIS)
250.0000 mL | Freq: Once | INTRAVENOUS | Status: AC
  Administered 2015-12-26: 250 mL via INTRAVENOUS

## 2015-12-26 MED ORDER — AMLODIPINE BESYLATE 5 MG PO TABS: 5.0000 mg | ORAL_TABLET | Freq: Every day | ORAL | Status: DC

## 2015-12-26 MED ORDER — SODIUM CHLORIDE 0.9 % IV SOLN
INTRAVENOUS | Status: AC
  Administered 2015-12-26 (×2): via INTRAVENOUS
  Administered 2015-12-26: 150 mL/h via INTRAVENOUS

## 2015-12-26 MED ORDER — PIPERACILLIN-TAZOBACTAM 3.375 G IVPB
3.3750 g | Freq: Three times a day (TID) | INTRAVENOUS | Status: DC
  Administered 2015-12-26 – 2015-12-27 (×2): 3.375 g via INTRAVENOUS
  Filled 2015-12-26 (×4): qty 50

## 2015-12-26 MED ORDER — PIPERACILLIN-TAZOBACTAM 3.375 G IVPB 30 MIN
3.3750 g | Freq: Once | INTRAVENOUS | Status: AC
  Administered 2015-12-26: 3.375 g via INTRAVENOUS
  Filled 2015-12-26: qty 50

## 2015-12-26 MED ORDER — HYDROCODONE-ACETAMINOPHEN 10-325 MG PO TABS
1.0000 | ORAL_TABLET | Freq: Four times a day (QID) | ORAL | Status: DC | PRN
  Administered 2015-12-26 – 2015-12-31 (×13): 1 via ORAL
  Filled 2015-12-26 (×14): qty 1

## 2015-12-26 MED ORDER — ENOXAPARIN SODIUM 40 MG/0.4ML ~~LOC~~ SOLN
40.0000 mg | Freq: Every day | SUBCUTANEOUS | Status: DC
  Administered 2015-12-27: 40 mg via SUBCUTANEOUS
  Filled 2015-12-26: qty 0.4

## 2015-12-26 MED ORDER — ACETAMINOPHEN 325 MG PO TABS
650.0000 mg | ORAL_TABLET | Freq: Once | ORAL | Status: AC
  Administered 2015-12-26: 650 mg via ORAL
  Filled 2015-12-26: qty 2

## 2015-12-26 MED ORDER — ACETAMINOPHEN 650 MG RE SUPP: 650.0000 mg | Freq: Four times a day (QID) | RECTAL | Status: DC | PRN

## 2015-12-26 MED ORDER — ACETAMINOPHEN 325 MG PO TABS
650.0000 mg | ORAL_TABLET | Freq: Four times a day (QID) | ORAL | Status: DC | PRN
  Administered 2015-12-26 – 2015-12-31 (×9): 650 mg via ORAL
  Filled 2015-12-26 (×11): qty 2

## 2015-12-26 MED ORDER — CEFTRIAXONE SODIUM 2 G IJ SOLR
2.0000 g | Freq: Once | INTRAMUSCULAR | Status: AC
  Administered 2015-12-26: 2 g via INTRAVENOUS
  Filled 2015-12-26: qty 2

## 2015-12-26 NOTE — ED Notes (Signed)
Nurse Hayley and I tried to insert 14 fr foley cath in patient but wasn't able to , so we tried a 10 fr but still didn't have any success, so we removed foley, patient had bleeding afterwards and Nurse contacted Doctor to inform him of what had happen.

## 2015-12-26 NOTE — Consult Note (Signed)
Patient with urinary retention, ED staff unable to pass catheter.  I have requested that someone try from the 6N floor - coude' catheter team.

## 2015-12-26 NOTE — ED Notes (Signed)
New orderes received from MD Abrol.

## 2015-12-26 NOTE — ED Notes (Signed)
Spoke with Urology stated will have coude team attempt insertion. Notified Nurse.

## 2015-12-26 NOTE — Procedures (Signed)
Preoperative diagnosis:  1. Bulbous urethral stricture   Postoperative diagnosis:  1. Same   Procedure: 1. Cystourethroscopy, insertion of complex Foley  Surgeon: Ardis Hughs, MD  Anesthesia: General  Complications: None  Intraoperative findings: The patient had had numerous false passages within the bulbous urethra and the true lumen was difficult to find. I was able to ultimately pass a wire through the true lumen which identified a mass in the bladder and passed an 18 [place] council tip Foley catheter over it.  EBL: Minimal  Specimens: None  Indication: [name] is a 60 y.o. patient with history of urethral stricture who presented to the emergency department with urinary retention as well as urinary tract infection. After multiple failed attempts by both the emergency room staff as well as the catheter placement team I was consult in to help facilitate placement of catheter to the patient's bladder.  Description of procedure: I started by trying pass a 16 French coud-tipped Foley catheter. I noted that within the mid shaft of the patient's. There was an abnormality causing the tip of the catheter to secure lateral. This point I opted to proceed with cystourethroscopy bedside.  The patient was prepped and draped in the routine standard sterile fashion. I then passed a 44 French flexible cystoscope through the urethral meatus and into the mid urethra where I had difficulty time locating the true lumen of the urethra. Ultimately I was able to advance a 0.38 sensor wire through the true lumen and into the bladder. I was unable to pass the scope over the wire. I such, I removed the scope leaving the wire within the urethra. I then advanced an 79 [place] council tip Foley catheter over the wire and after meeting some resistance was able to ultimately pass the catheter into the patient's bladder atraumatically. Query all urine was returned. 10 mL of sterile water was inserted into  the patient's balloon.  Ardis Hughs, M.D.

## 2015-12-26 NOTE — Progress Notes (Signed)
CRITICAL VALUE ALERT  Critical value received: Lactic Acid 3.3  Date of notification:  12/26/15  Time of notification:  2245  Critical value read back: yes  Nurse who received alert: Arnell Sieving  MD notified (1st page):  Baltazar Najjar  Time of first page:  2250  MD notified (2nd page):  Time of second page:  Responding MD:  Baltazar Najjar  Time MD responded:  2325

## 2015-12-26 NOTE — ED Notes (Signed)
Bladder scan revealed >999cc in bladder.  Abdomen firm and distended.

## 2015-12-26 NOTE — Progress Notes (Signed)
RT called MD with ABG results and left a message with Network engineer. No critical values; called back due to MD request.

## 2015-12-26 NOTE — Progress Notes (Signed)
Pt oral temp 101.5, Resp34. Pt on 3L N/C. Pt is shaking at times. Baltazar Najjar paged and aware.  New orders received for labs, apply ice to groin and axilla, and infuse 1000cc NS Bolus.  Pt rectal temp 102.8. Baltazar Najjar paged and aware.  RN will follow orders and continue to monitor pt.  Arnell Sieving, RN

## 2015-12-26 NOTE — Progress Notes (Signed)
CRITICAL VALUE ALERT  Critical value received:  Lactic Acid: 3.5  Date of notification:  12/26/15  Time of notification:  1943  Critical value read back: yes  Nurse who received alert:  Arnell Sieving, RN   MD notified (1st page):  Baltazar Najjar  Time of first page:  1944  MD notified (2nd page):Kirby  Time of second page: 2030  Responding MD:  New order from [facility] 1000cc Saline Bolus   Time MD responded:  n/A

## 2015-12-26 NOTE — ED Notes (Signed)
Dr Louis Meckel to bedside

## 2015-12-26 NOTE — ED Notes (Addendum)
Attempt to place foley x 1 by RN, met resistance, RN immediately stopped.   Attempt to place 31f by EMT, unable to place as well. Small amount of blood noted after 2nd attempt by EMT. MD paged on call.  MD AKarleen Hampshireaware, to contact urology.

## 2015-12-26 NOTE — ED Provider Notes (Signed)
9:46 AM Pt assessed at request of nursing and Dr Allyson Sabal. Sitting on toilet. Alert/talking. Appears uncomfortable, but not toxic. Gross blood dripping from meatus.  Unable to place catheter on several attempts. Appropriate abx have been ordered. Continue antipyretics. Awaiting urology evaluation. Urology cart at bedside.  Virgel Manifold, MD 12/26/15 [phone]

## 2015-12-26 NOTE — ED Notes (Signed)
Coude catheter insertion unsuccessful, Dr Karleen Hampshire paged and updated

## 2015-12-26 NOTE — Progress Notes (Signed)
Pharmacy Antibiotic Note  [name] is a 60 y.o. male admitted on 12/25/2015 with sepsis.  Pharmacy has been consulted for zosyn dosing. He got Rocephin 2 gm at 2231 and 0136 - so total of 4 gm of Rocephin. PMH recurrent UTI. Pharmacy consulted to dose zosyn for sepsis from UTI w/ L scrotal pain concerning for epidymitis.  Bladder scan w/ > 999cc in bladder. ED unable to place foley on several attempts. WBC WNL, creat 1.45, lactate 3.29>1.4. Temp 102.3  Plan: zosyn 3.375 gm IV x 1 dose over 30 minutes thenZosyn 3.375g IV q8h (4 hour infusion).     Temp (24hrs), Avg:101.6 F (38.7 C), Min:100.5 F (38.1 C), Max:102.5 F (39.2 C)   Recent Labs Lab 12/25/15 1536 12/25/15 2127 12/26/15 0346 12/26/15 0347  WBC 9.0  --  8.6  --   CREATININE 1.41*  --  1.45*  --   LATICACIDVEN  --  3.29*  --  1.4    CrCl cannot be calculated (Unknown ideal weight.).    Allergies  Allergen Reactions  . Lisinopril Cough    Thank you for allowing pharmacy to be a part of this patient's care.  Eudelia Bunch, Pharm.D. [phone] 12/26/2015 10:04 AM

## 2015-12-26 NOTE — Progress Notes (Signed)
R Swineford is a 60 y.o. male history of hypertension chronic low back pain presents to the ER because of fever and chills. He was found to have sepsis from UTI, . He was admitted earlier this morning and started on broad spectrum antibiotics.  Meanwhile he had urinary retention and multiple attempts were made to put a foley catheter , but were unsuccessful. Urology consulted and requested for coude catheter placement. Meanwhile continue with IV hydration, IV antibiotics and pain control as needed.   Hosie Poisson, MD [phone]

## 2015-12-26 NOTE — ED Notes (Signed)
Called [place] notified patient symptoms. Dr Louis Meckel notified nurse in POD C stated patient bleeding from penis after attempts made from insertion of foley. Ordered coude team to attempt foley insertion.

## 2015-12-26 NOTE — ED Notes (Signed)
Lab called to question if pt has prior document hx of a blood disorder, ie lymphoma etc. No prior hx documented in charge other than HTN

## 2015-12-26 NOTE — Consult Note (Signed)
I have been asked to see the patient by Dr. Hosie Poisson, MD, for evaluation and management of difficulty foley placement.  History of present illness: 60 year old male with a history of a infectious bulbar urethral stricture and into the emergency department with acute onset lower urinary tract symptoms including frequency, urgency, incomplete bladder emptying, weak stream, dysuria, and suprapubic pain. The patient states that for the past month he is at foul-smelling urine, but no associated symptoms. The patient was also complaining of less testicular/scrotal pain. He has had his urethra dilated 3 or 4 times over the last 20 or 30 years. His most recent dilation was in [place] for 5 years prior. The patient states that He has a weak stream but is able to empty his bladder completely. He does not have a history of recurrent urinary tract infections. In the ER the patient was noted have a bladder scan approximately 900 mL. Several attempts at passing a catheter were made in the ER by emergency room staff. The catheter T was subsequently called in attempted to place a catheter as well. All these attempts failed. I subsequently presented in attempted a coud tip catheter unsuccessfully. I then scoped and a catheter noting several false passages within the bulbar urethra. I was able to find the true lumen of the urethra, albeit a very small and narrow lumen. Once I was able to get a wire across this I then passed an 13 [place] council-tipped catheter. Since the patient has had his Foley catheter placed his urine is pink clear. He has developed fever and progressive oxygenation requirement. His lactate has been slowly rising.  Review of systems: A 12 point comprehensive review of systems was obtained and is negative unless otherwise stated in the history of present illness.  Patient Active Problem List   Diagnosis Date Noted  . Sepsis ([place]) 12/26/2015  . Acute UTI   . Sepsis secondary to UTI ([place])  12/25/2015  . Gout of big toe 05/21/2013  . Back pain 11/03/2012  . Hypertension 11/03/2012    No current facility-administered medications on file prior to encounter.   Current Outpatient Prescriptions on File Prior to Encounter  Medication Sig Dispense Refill  . amLODipine (NORVASC) 5 MG tablet Take 1 tablet (5 mg total) by mouth daily. 90 tablet 2  . HYDROcodone-acetaminophen (NORCO) 10-325 MG tablet Take 1 tablet by mouth every 6 (six) hours as needed. 60 tablet 0  . losartan (COZAAR) 100 MG tablet TAKE 1 TABLET (100 MG TOTAL) BY MOUTH DAILY. 90 tablet 1  . Multiple Vitamins-Minerals (MENS 50+ MULTI VITAMIN/MIN PO) Take 1 tablet by mouth daily.    . cyclobenzaprine (FLEXERIL) 10 MG tablet Take 1 tablet (10 mg total) by mouth 3 (three) times daily as needed for muscle spasms. 30 tablet 0    Past Medical History  Diagnosis Date  . Hypertension   . Sepsis ([place]) hospitalized 12/25/2015  . History of gout     Past Surgical History  Procedure Laterality Date  . Inguinal hernia repair Right     Social History  Substance Use Topics  . Smoking status: Never Smoker   . Smokeless tobacco: Never Used  . Alcohol Use: 3.6 oz/week    6 Glasses of wine per week    Family History  Problem Relation Age of Onset  . Diabetes Mellitus II Paternal Grandfather     PE: Filed Vitals:   12/26/15 1415 12/26/15 1430 12/26/15 1445 12/26/15 1528  BP: 95/68 98/71 96/70  106/65  Pulse:  105 106 103 113  Temp:    99.8 F (37.7 C)  TempSrc:    Oral  Resp: 22 23 19 20   Height:    5\' 9"  (1.753 m)  Weight:    99.791 kg (220 lb)  SpO2: 95% 94% 94% 89%   Patient appears to be in no acute distress  patient is alert and oriented x3 Atraumatic normocephalic head No cervical or supraclavicular lymphadenopathy appreciated Slight increased work of breathing, no audible wheezes/rhonchi Regular sinus rhythm/rate Abdomen is soft, nontender, nondistended, no CVA or suprapubic tenderness Patient has a  tender and edematous left epididymis with scrotal edema Lower extremities are symmetric without appreciable edema Grossly neurologically intact No identifiable skin lesions   Recent Labs  12/25/15 1536 12/26/15 0346  WBC 9.0 8.6  HGB 14.6 12.3*  HCT 42.0 35.8*    Recent Labs  12/25/15 1536 12/26/15 0346  NA 137 137  K 4.0 3.6  CL 104 107  CO2 20* 20*  GLUCOSE 89 99  BUN 16 17  CREATININE 1.41* 1.45*  CALCIUM 9.6 8.0*    Recent Labs  12/26/15 0346  INR 1.27   No results for input(s): LABURIN in the last 72 hours. Results for orders placed or performed during the hospital encounter of 12/25/15  Urine culture     Status: None (Preliminary result)   Collection Time: 12/25/15 10:07 PM  Result Value Ref Range Status   Specimen Description URINE, CLEAN CATCH  Final   Special Requests NONE  Final   Culture >=100,000 COLONIES/mL ESCHERICHIA COLI  Final   Report Status PENDING  Incomplete    Imaging: none  Imp:The patient has a history of a bulbar urethral stricture from 7 to transmitted disease from 20-30 years ago. He has had several dilations over that interval. Over the past month he has had symptoms of cystitis with foul-smelling urine. This progressed over the last several days the patient developed urinary retention with subsequent urosepsis. He also likely has left epididymal orchitis.  Recommendations: The catheter was placed using cystoscopy. The catheter should be removed in 10 days. I have set up a voiding trial in the [facility] office performed in 10 days. The patient should be placed on culture specific antibiotics for 2 weeks. This will cover both his urinary tract infection as well as his epididymal orchitis.  Thank you for involving me in this patient's care, Please page with any further questions or concerns. Louis Meckel W

## 2015-12-26 NOTE — H&P (Signed)
[facility] History and Physical  [name] [id number] DOB: January 17, 1956 DOA: 12/25/2015  Referring physician: Dr.Steinl. PCP: Nyoka Cowden, MD  Specialists: None.  Chief Complaint: Fever chills.  HPI: [name] is a 60 y.o. male history of hypertension chronic low back pain presents to the ER because of fever chills since last evening while at work. Patient also has been experiencing left scrotal pain with dysuria. All his symptoms started last evening. Denies any chest pain or shortness of breath productive cough. Has been having some vague abdominal discomfort patient is not able to explain exactly. On exam patient's scrotum is tender to touch also has diffuse abdominal tenderness. UA is compatible with UTI patient is febrile. Patient has been admitted for further management of sepsis from UTI. Patient has had previous episodes of recurrent UTI. Has had last sexual contact with known persistent last week. Denies any penile discharge.   Review of Systems: As presented in the history of presenting illness, rest negative.  Past Medical History  Diagnosis Date  . Hypertension    Past Surgical History  Procedure Laterality Date  . Hernia repair     Social History:  reports that he has never smoked. He does not have any smokeless tobacco history on file. He reports that he drinks alcohol. He reports that he does not use illicit drugs. Where does patient live Home. Can patient participate in ADLs? Yes.  Allergies  Allergen Reactions  . Lisinopril Cough    Family History:  Family History  Problem Relation Age of Onset  . Diabetes Mellitus II Paternal Grandfather       Prior to Admission medications   Medication Sig Start Date End Date Taking? Authorizing Provider  amLODipine (NORVASC) 5 MG tablet Take 1 tablet (5 mg total) by mouth daily. 07/21/15  Yes Marletta Lor, MD  HYDROcodone-acetaminophen [facility]) 10-325 MG tablet Take 1 tablet by mouth  every 6 (six) hours as needed. 10/31/15  Yes Marletta Lor, MD  losartan (COZAAR) 100 MG tablet TAKE 1 TABLET (100 MG TOTAL) BY MOUTH DAILY. 08/16/15  Yes Marletta Lor, MD  Multiple Vitamins-Minerals (MENS 50+ MULTI VITAMIN/MIN PO) Take 1 tablet by mouth daily.   Yes Historical Provider, MD  cyclobenzaprine (FLEXERIL) 10 MG tablet Take 1 tablet (10 mg total) by mouth 3 (three) times daily as needed for muscle spasms. 10/20/14   Marletta Lor, MD    Physical Exam: Filed Vitals:   12/25/15 2057 12/25/15 2200 12/25/15 2230 12/25/15 2300  BP: 146/82 142/90 132/81 132/72  Pulse: 86 87 89 102  Temp: 102.3 F (39.1 C)     TempSrc: Rectal     Resp: 35 23 15 24   SpO2: 100% 99% 99% 97%     General:  Moderately built and nourished.  Eyes: Anicteric no pallor.  ENT: No discharge from the ears eyes nose and mouth.  Neck: No mass felt. No JVD appreciated.  Cardiovascular: S1-S2 heard.  Respiratory: No rhonchi or crepitations.  Abdomen: Mild tenderness diffusely. There is scrotal tenderness on the left side.  Skin: Mild erythema of the left scrotum.  Musculoskeletal: No edema.  Psychiatric: Appears normal.  Neurologic: Alert awake oriented to time place and person. Moves all extremities.  Labs on Admission:  Basic Metabolic Panel:  Recent Labs Lab 12/25/15 1536  NA 137  K 4.0  CL 104  CO2 20*  GLUCOSE 89  BUN 16  CREATININE 1.41*  CALCIUM 9.6   Liver Function Tests:  Recent  Labs Lab 12/25/15 1536  AST 32  ALT 33  ALKPHOS 36*  BILITOT 0.8  PROT 8.4*  ALBUMIN 4.4   No results for input(s): LIPASE, AMYLASE in the last 168 hours. No results for input(s): AMMONIA in the last 168 hours. CBC:  Recent Labs Lab 12/25/15 1536  WBC 9.0  HGB 14.6  HCT 42.0  MCV 83.3  PLT 148*   Cardiac Enzymes: No results for input(s): CKTOTAL, CKMB, CKMBINDEX, TROPONINI in the last 168 hours.  BNP (last 3 results) No results for input(s): BNP in the last 8760  hours.  ProBNP (last 3 results) No results for input(s): PROBNP in the last 8760 hours.  CBG: No results for input(s): GLUCAP in the last 168 hours.  Radiological Exams on Admission: No results found.   Assessment/Plan Principal Problem:   Sepsis ([place]) Active Problems:   Hypertension   1. Sepsis from UTI with left scrotal pain concerning for epididymitis - follow blood cultures urine cultures for lactic acid levels. Patient has been empirically placed on ceftriaxone for UTI. I have CT abdomen and pelvis without contrast and ultrasound of the scrotum. Check chlamydia and gonorrhea probe and HIV and RPR status. Continue with IV hydration and pain medications. 2. Hypertension - patient is on Cozaar and amlodipine. If creatinine worsens may have to hold Cozaar. 3. Chronic kidney disease stage II - see #2. 4. Chronic low back pain on hydrocodone.   DVT Prophylaxis Lovenox.  Code Status: Full code.  Family Communication: Discussed with patient.  Disposition Plan: Admit to inpatient.    Aleina Burgio N. [facility] Pager [phone].  If 7PM-7AM, please contact night-coverage [email or web address] Password [email or web address] 12/26/2015, 1:27 AM

## 2015-12-26 NOTE — ED Notes (Signed)
Pt tremoring, hyperventilating, reports muscle cramps, tachypnic, HR 110s.   MD paged and made aware. Per MD, co-worker to see pt.  RN attempt to calm pt, slow breathing.

## 2015-12-27 ENCOUNTER — Inpatient Hospital Stay (HOSPITAL_COMMUNITY): Payer: BLUE CROSS/BLUE SHIELD

## 2015-12-27 LAB — CBC
HCT: 29.3 % — ABNORMAL LOW (ref 39.0–52.0)
HEMATOCRIT: 29.8 % — AB (ref 39.0–52.0)
HEMOGLOBIN: 10.2 g/dL — AB (ref 13.0–17.0)
HEMOGLOBIN: 9.9 g/dL — AB (ref 13.0–17.0)
MCH: 28.7 pg (ref 26.0–34.0)
MCH: 27.8 pg (ref 26.0–34.0)
MCHC: 34.2 g/dL (ref 30.0–36.0)
MCHC: 33.8 g/dL (ref 30.0–36.0)
MCV: 83.7 fL (ref 78.0–100.0)
MCV: 82.3 fL (ref 78.0–100.0)
Platelets: 61 10*3/uL — ABNORMAL LOW (ref 150–400)
Platelets: 58 10*3/uL — ABNORMAL LOW (ref 150–400)
RBC: 3.56 MIL/uL — ABNORMAL LOW (ref 4.22–5.81)
RBC: 3.56 MIL/uL — ABNORMAL LOW (ref 4.22–5.81)
RDW: 15.2 % (ref 11.5–15.5)
RDW: 15.1 % (ref 11.5–15.5)
WBC: 15.5 10*3/uL — ABNORMAL HIGH (ref 4.0–10.5)
WBC: 15.2 10*3/uL — ABNORMAL HIGH (ref 4.0–10.5)

## 2015-12-27 LAB — COMPREHENSIVE METABOLIC PANEL
ALBUMIN: 2.5 g/dL — AB (ref 3.5–5.0)
ALBUMIN: 2.4 g/dL — AB (ref 3.5–5.0)
ALT: 38 U/L (ref 17–63)
ALT: 47 U/L (ref 17–63)
ANION GAP: 12 (ref 5–15)
AST: 54 U/L — AB (ref 15–41)
AST: 49 U/L — AB (ref 15–41)
Alkaline Phosphatase: 33 U/L — ABNORMAL LOW (ref 38–126)
Alkaline Phosphatase: 77 U/L (ref 38–126)
Anion gap: 11 (ref 5–15)
BUN: 13 mg/dL (ref 6–20)
BUN: 17 mg/dL (ref 6–20)
CHLORIDE: 111 mmol/L (ref 101–111)
CHLORIDE: 111 mmol/L (ref 101–111)
CO2: 17 mmol/L — AB (ref 22–32)
CO2: 16 mmol/L — AB (ref 22–32)
CREATININE: 1.62 mg/dL — AB (ref 0.61–1.24)
Calcium: 7 mg/dL — ABNORMAL LOW (ref 8.9–10.3)
Calcium: 7 mg/dL — ABNORMAL LOW (ref 8.9–10.3)
Creatinine, Ser: 2.09 mg/dL — ABNORMAL HIGH (ref 0.61–1.24)
GFR calc Af Amer: 39 mL/min — ABNORMAL LOW (ref >60)
GFR calc Af Amer: 52 mL/min — ABNORMAL LOW (ref >60)
GFR calc non Af Amer: 33 mL/min — ABNORMAL LOW (ref >60)
GFR calc non Af Amer: 45 mL/min — ABNORMAL LOW (ref >60)
GLUCOSE: 90 mg/dL (ref 65–99)
GLUCOSE: 89 mg/dL (ref 65–99)
POTASSIUM: 3.3 mmol/L — AB (ref 3.5–5.1)
POTASSIUM: 3.5 mmol/L (ref 3.5–5.1)
SODIUM: 139 mmol/L (ref 135–145)
SODIUM: 139 mmol/L (ref 135–145)
Total Bilirubin: 1 mg/dL (ref 0.3–1.2)
Total Bilirubin: 1.5 mg/dL — ABNORMAL HIGH (ref 0.3–1.2)
Total Protein: 5.3 g/dL — ABNORMAL LOW (ref 6.5–8.1)
Total Protein: 5.7 g/dL — ABNORMAL LOW (ref 6.5–8.1)

## 2015-12-27 LAB — BASIC METABOLIC PANEL
Anion gap: 14 (ref 5–15)
BUN: 15 mg/dL (ref 6–20)
CHLORIDE: 113 mmol/L — AB (ref 101–111)
CO2: 14 mmol/L — AB (ref 22–32)
CREATININE: 1.84 mg/dL — AB (ref 0.61–1.24)
Calcium: 6.9 mg/dL — ABNORMAL LOW (ref 8.9–10.3)
GFR calc non Af Amer: 39 mL/min — ABNORMAL LOW (ref >60)
GFR, EST AFRICAN AMERICAN: 45 mL/min — AB (ref >60)
GLUCOSE: 78 mg/dL (ref 65–99)
Potassium: 3.9 mmol/L (ref 3.5–5.1)
Sodium: 141 mmol/L (ref 135–145)

## 2015-12-27 LAB — LACTIC ACID, PLASMA
LACTIC ACID, VENOUS: 3.1 mmol/L — AB (ref 0.5–2.0)
LACTIC ACID, VENOUS: 2.5 mmol/L — AB (ref 0.5–2.0)
Lactic Acid, Venous: 2.8 mmol/L (ref 0.5–2.0)
Lactic Acid, Venous: 3 mmol/L (ref 0.5–2.0)
Lactic Acid, Venous: 1.9 mmol/L (ref 0.5–2.0)

## 2015-12-27 LAB — BLOOD GAS, ARTERIAL
ACID-BASE DEFICIT: 9.3 mmol/L — AB (ref 0.0–2.0)
ACID-BASE DEFICIT: 7.3 mmol/L — AB (ref 0.0–2.0)
ACID-BASE DEFICIT: 6.8 mmol/L — AB (ref 0.0–2.0)
BICARBONATE: 14.5 meq/L — AB (ref 20.0–24.0)
Bicarbonate: 16.6 mEq/L — ABNORMAL LOW (ref 20.0–24.0)
Bicarbonate: 15.8 mEq/L — ABNORMAL LOW (ref 20.0–24.0)
DRAWN BY: 422461
DRAWN BY: 105521
Drawn by: 441661
FIO2: 0.45
FIO2: 0.21
O2 Content: 10 L/min
O2 Content: 3 L/min
O2 Saturation: 96.3 %
O2 Saturation: 87.9 %
O2 Saturation: 89.1 %
PATIENT TEMPERATURE: 100.4
PATIENT TEMPERATURE: 102.3
PCO2 ART: 22.6 mmHg — AB (ref 35.0–45.0)
PCO2 ART: 27.5 mmHg — AB (ref 35.0–45.0)
PO2 ART: 53.8 mmHg — AB (ref 80.0–100.0)
Patient temperature: 98.6
TCO2: 15.2 mmol/L (ref 0–100)
TCO2: 17.4 mmol/L (ref 0–100)
TCO2: 16.5 mmol/L (ref 0–100)
pCO2 arterial: 24.9 mmHg — ABNORMAL LOW (ref 35.0–45.0)
pH, Arterial: 7.389 (ref 7.350–7.450)
pH, Arterial: 7.409 (ref 7.350–7.450)
pH, Arterial: 7.46 — ABNORMAL HIGH (ref 7.350–7.450)
pO2, Arterial: 81.7 mmHg (ref 80.0–100.0)
pO2, Arterial: 60 mmHg — ABNORMAL LOW (ref 80.0–100.0)

## 2015-12-27 LAB — RETICULOCYTES
RBC.: 3.38 MIL/uL — ABNORMAL LOW (ref 4.22–5.81)
RETIC COUNT ABSOLUTE: 37.2 10*3/uL (ref 19.0–186.0)
RETIC CT PCT: 1.1 % (ref 0.4–3.1)

## 2015-12-27 LAB — VITAMIN B12: VITAMIN B 12: 216 pg/mL (ref 180–914)

## 2015-12-27 LAB — MAGNESIUM: MAGNESIUM: 1.2 mg/dL — AB (ref 1.7–2.4)

## 2015-12-27 LAB — CBC WITH DIFFERENTIAL/PLATELET
BASOS ABS: 0 10*3/uL (ref 0.0–0.1)
BASOS PCT: 0 %
Eosinophils Absolute: 0 10*3/uL (ref 0.0–0.7)
Eosinophils Relative: 0 %
HEMATOCRIT: 29.4 % — AB (ref 39.0–52.0)
HEMOGLOBIN: 9.7 g/dL — AB (ref 13.0–17.0)
LYMPHS ABS: 0.9 10*3/uL (ref 0.7–4.0)
LYMPHS PCT: 6 %
MCH: 27.5 pg (ref 26.0–34.0)
MCHC: 33 g/dL (ref 30.0–36.0)
MCV: 83.3 fL (ref 78.0–100.0)
MONOS PCT: 4 %
Monocytes Absolute: 0.6 10*3/uL (ref 0.1–1.0)
NEUTROS ABS: 12.8 10*3/uL — AB (ref 1.7–7.7)
Neutrophils Relative %: 90 %
Platelets: 64 10*3/uL — ABNORMAL LOW (ref 150–400)
RBC: 3.53 MIL/uL — ABNORMAL LOW (ref 4.22–5.81)
RDW: 14.8 % (ref 11.5–15.5)
WBC: 14.3 10*3/uL — ABNORMAL HIGH (ref 4.0–10.5)

## 2015-12-27 LAB — GLUCOSE, CAPILLARY: Glucose-Capillary: 86 mg/dL (ref 65–99)

## 2015-12-27 LAB — FOLATE: Folate: 6.3 ng/mL (ref >5.9)

## 2015-12-27 LAB — URINE CULTURE

## 2015-12-27 LAB — IRON AND TIBC
IRON: 12 ug/dL — AB (ref 45–182)
Saturation Ratios: 11 % — ABNORMAL LOW (ref 17.9–39.5)
TIBC: 109 ug/dL — ABNORMAL LOW (ref 250–450)
UIBC: 97 ug/dL

## 2015-12-27 LAB — FERRITIN: FERRITIN: 887 ng/mL — AB (ref 24–336)

## 2015-12-27 LAB — MRSA PCR SCREENING: MRSA BY PCR: NEGATIVE

## 2015-12-27 LAB — LACTATE DEHYDROGENASE: LDH: 220 U/L — AB (ref 98–192)

## 2015-12-27 MED ORDER — ONDANSETRON HCL 4 MG/2ML IJ SOLN
4.0000 mg | Freq: Once | INTRAMUSCULAR | Status: AC
  Administered 2015-12-27: 4 mg via INTRAVENOUS
  Filled 2015-12-27: qty 2

## 2015-12-27 MED ORDER — SODIUM CHLORIDE 0.9 % IV BOLUS (SEPSIS)
1000.0000 mL | Freq: Once | INTRAVENOUS | Status: AC
  Administered 2015-12-27: 1000 mL via INTRAVENOUS

## 2015-12-27 MED ORDER — POTASSIUM CHLORIDE CRYS ER 20 MEQ PO TBCR: 40.0000 meq | EXTENDED_RELEASE_TABLET | Freq: Two times a day (BID) | ORAL | Status: DC

## 2015-12-27 MED ORDER — MAGNESIUM SULFATE 50 % IJ SOLN
3.0000 g | Freq: Once | INTRAVENOUS | Status: AC
  Administered 2015-12-27: 3 g via INTRAVENOUS
  Filled 2015-12-27 (×3): qty 6

## 2015-12-27 MED ORDER — SODIUM CHLORIDE 0.9 % IV SOLN
500.0000 mg | Freq: Three times a day (TID) | INTRAVENOUS | Status: DC
  Administered 2015-12-27 – 2015-12-29 (×8): 500 mg via INTRAVENOUS
  Filled 2015-12-27 (×11): qty 500

## 2015-12-27 MED ORDER — POTASSIUM CHLORIDE 10 MEQ/100ML IV SOLN
10.0000 meq | INTRAVENOUS | Status: AC
  Administered 2015-12-27 (×2): 10 meq via INTRAVENOUS
  Filled 2015-12-27 (×2): qty 100

## 2015-12-27 MED ORDER — SODIUM CHLORIDE 0.9 % IV BOLUS (SEPSIS): 1000.0000 mL | Freq: Once | INTRAVENOUS | Status: DC

## 2015-12-27 MED ORDER — SODIUM CHLORIDE 0.9 % IV SOLN
INTRAVENOUS | Status: DC
Start: 2015-12-27 — End: 2016-01-01
  Administered 2015-12-27 – 2015-12-31 (×10): via INTRAVENOUS

## 2015-12-27 NOTE — Progress Notes (Signed)
Pt received from [facility]west as transfer per bed to 2C01. Alert & oriented but HOH. IV fluid bolus going at 250 cc/hr. 02 n/c at 4 L. CHG bath done MRSA swab done. Oriented to room.

## 2015-12-27 NOTE — Progress Notes (Addendum)
Patient actively vomitting. IV Zofran. VSS. MD notified. Will continue to monitor.   Domingo Dimes RN

## 2015-12-27 NOTE — Progress Notes (Addendum)
[facility] PROGRESS NOTE  [name] [id number] DOB: 1956/02/28 DOA: 12/25/2015 PCP: Nyoka Cowden, MD Interim summary: [name] is a 59 y.o. male history of hypertension chronic low back pain presents to the ER because of fever and chills. He was found to have sepsis from UTI, and epididymal orchitis. He was started on broad spectrum antibiotics. He also had urinary retention, urology consulted and catheter was placed using cystoscopy.  Assessment/Plan: 1. Sepsis from pyelonephritis and epididymal orchitis: - he waas initially on zosyn , changed to Primaxin earlier this am, blood cultures have been negative so far.  - urine cultures grew E coli sensitive to rocephin and primaxin. Plan to narrow the antibiotics to rocephin in am.  - IV hydration and trend lactic acid levels.  - bp normalized today.    2. Acute respiratory failure of unclear etiology: Repeat CXR ordered this am and showed small bilateral pleural effusions with slight pulmonary vascular congestion. He was off oxygen earlier this am, but his sats were around 90% on RA and he was put on 2l it [place] oxygen.  ABG this afternoon shows PH OF 7.46, BICARB of 15.8, Pco2 of 22 and po2 of 53.   3. Urinary retention possibly from stricture formation: Urology consulted and he underwent catheter placement using cystoscopy. It should be removed in 10 days, a voiding trail set up in [facility] in 10 days.   4. 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  Monitor.   5. Chronic back pain: On pain meds.    6. Hypertension: His bp has been soft all day yesterday and it has improved today.  Monitor off bp meds.   7. Metabolic acidosis / lactic acidosis possibly from sepsis: Bicarb is 15 and repeat labs ordered this afternoon.  Persistently elevated lactic acid. Trend lactic acid.  His mentation is good, alert and oriented to place, person and time.   8. Anemia and  thrombocytopenia: - BRBPR earlier today,  - hemoglobin on admission was 14, but it appears his baseline hemoglobin is around 12 and has dropped to 10 to 9.9.  - anemia panel, LDH, haptoglobin ordered.   H/o thrombocytopenia in the past, continue to monitor.  Ordered smear for evaluation.    9. Elevated bilirubin and AST: with nausea and vomiting today.  US abdomen ordered for further evaluation.    10. Hypokalemia and hypomagnesemia: Replete as needed and repeat in am.   Code Status: full code.  Family Communication: discussed with family at bedside Disposition Plan: transfer to stepdown.    Consultants:  Urology.   Procedures:  Cystoscopy.  Antibiotics:  primaxin started on 4/5  HPI/Subjective: Reports headache .  Objective: Filed Vitals:   12/27/15 0145 12/27/15 0315  BP:  115/75  Pulse:  93  Temp: 100.9 F (38.3 C) 100.4 F (38 C)  Resp:      Intake/Output Summary (Last 24 hours) at 12/27/15 1215 Last data filed at 12/27/15 0759  Gross per 24 hour  Intake      0 ml  Output   3550 ml  Net  -3550 ml   Filed Weights   12/26/15 1528 12/27/15 0451  Weight: 99.791 kg (220 lb) 101.8 kg (224 lb 6.9 oz)    Exam:   General:  Alert and off oxygen  Cardiovascular: s1s2  Respiratory: ctab  Abdomen: soft non tender non distended bowel sounds heard  Musculoskeletal: trace pedal edema   Data Reviewed: Basic Metabolic  Panel:  Recent Labs Lab 12/25/15 1536 12/26/15 0346 12/26/15 2332 12/27/15 0442  NA 137 137 139 141  K 4.0 3.6 3.5 3.9  CL 104 107 111 113*  CO2 20* 20* 16* 14*  GLUCOSE 89 99 89 78  BUN 16 17 17 15   CREATININE 1.41* 1.45* 2.09* 1.84*  CALCIUM 9.6 8.0* 7.0* 6.9*   Liver Function Tests:  Recent Labs Lab 12/25/15 1536 12/26/15 0346 12/26/15 2332  AST 32 25 49*  ALT 33 23 38  ALKPHOS 36* 29* 33*  BILITOT 0.8 0.7 1.0  PROT 8.4* 6.3* 5.3*  ALBUMIN 4.4 3.1* 2.4*   No results for input(s): LIPASE, AMYLASE in the last 168  hours. No results for input(s): AMMONIA in the last 168 hours. CBC:  Recent Labs Lab 12/25/15 1536 12/26/15 0346 12/26/15 2332 12/27/15 0442  WBC 9.0 8.6 14.3* 15.5*  NEUTROABS  --  5.8 12.8*  --   HGB 14.6 12.3* 9.7* 10.2*  HCT 42.0 35.8* 29.4* 29.8*  MCV 83.3 83.6 83.3 83.7  PLT 148* 117* 64* 61*   Cardiac Enzymes: No results for input(s): CKTOTAL, CKMB, CKMBINDEX, TROPONINI in the last 168 hours. BNP (last 3 results) No results for input(s): BNP in the last 8760 hours.  ProBNP (last 3 results) No results for input(s): PROBNP in the last 8760 hours.  CBG: No results for input(s): GLUCAP in the last 168 hours.  Recent Results (from the past 240 hour(s))  Urine culture     Status: None   Collection Time: 12/25/15 10:07 PM  Result Value Ref Range Status   Specimen Description URINE, CLEAN CATCH  Final   Special Requests NONE  Final   Culture >=100,000 COLONIES/mL ESCHERICHIA COLI  Final   Report Status 12/27/2015 FINAL  Final   Organism ID, Bacteria ESCHERICHIA COLI  Final      Susceptibility   Escherichia coli - MIC*    AMPICILLIN >=32 RESISTANT Resistant     CEFAZOLIN <=4 SENSITIVE Sensitive     CEFTRIAXONE <=1 SENSITIVE Sensitive     CIPROFLOXACIN >=4 RESISTANT Resistant     GENTAMICIN >=16 RESISTANT Resistant     IMIPENEM <=0.25 SENSITIVE Sensitive     NITROFURANTOIN <=16 SENSITIVE Sensitive     TRIMETH/SULFA <=20 SENSITIVE Sensitive     AMPICILLIN/SULBACTAM 16 INTERMEDIATE Intermediate     PIP/TAZO <=4 SENSITIVE Sensitive     * >=100,000 COLONIES/mL ESCHERICHIA COLI     Studies: Ct Abdomen Pelvis Wo Contrast  12/26/2015  CLINICAL DATA:  Abdominal pain, chills, onset this morning. EXAM: CT ABDOMEN AND PELVIS WITHOUT CONTRAST TECHNIQUE: Multidetector CT imaging of the abdomen and pelvis was performed following the standard protocol without IV contrast. COMPARISON:  None. FINDINGS: There is marked urinary bladder distention, up above the level of the umbilicus,  24 cm craniocaudal. There is hydronephrosis and hydroureter bilaterally. No urinary calculi. There are unremarkable unenhanced appearances of the liver, bile ducts, spleen, pancreas and adrenals. The abdominal aorta is normal in caliber. There is mild atherosclerotic calcification. There is no adenopathy in the abdomen or pelvis. There is a small hiatal hernia. The stomach and small bowel are otherwise unremarkable. The appendix is normal. There is moderate colonic diverticulosis. There is no evidence of acute diverticulitis or other acute inflammatory process. There is no significant abnormality in the lower chest. There is no significant skeletal lesion. IMPRESSION: 1. Marked urinary bladder distention, up above the level of the umbilicus. Hydronephrosis bilaterally is probably secondary to the bladder distention. 2. Small hiatal  hernia. 3. Diverticulosis. Electronically Signed   By: Andreas [place] M.D.   On: 12/26/2015 02:34   US Scrotum  12/26/2015  CLINICAL DATA:  Left testicular pain for 1 day EXAM: SCROTAL ULTRASOUND DOPPLER ULTRASOUND OF THE TESTICLES TECHNIQUE: Complete ultrasound examination of the testicles, epididymis, and other scrotal structures was performed. Color and spectral Doppler ultrasound were also utilized to evaluate blood flow to the testicles. COMPARISON:  None. FINDINGS: Right testicle Measurements: 4.3 x 2.2 x 2.4 cm. No mass or microlithiasis visualized. Left testicle Measurements: 3.5 x 2.2 x 2.8 cm. No solid masses. There are at least 4 subcentimeter cysts, the largest measuring 6 x 7 x 7 mm. Right epididymis:  Mildly enlarged and hyperemic.  No focal lesion. Left epididymis:  Mildly enlarged without focal lesion. Hydrocele:  None visualized. Varicocele:  None visualized. Pulsed Doppler interrogation of both testes demonstrates normal low resistance arterial and venous waveforms bilaterally. IMPRESSION: There are several cysts in the left testis superiorly. No solid testicular  masses. Both testes are perfused. Both epididymides mildly enlarged without focal lesion. Electronically Signed   By: Andreas [place] M.D.   On: 12/26/2015 02:43   [place] Art/ven Flow Abd Pelv Doppler  12/26/2015  CLINICAL DATA:  Left testicular pain for 1 day EXAM: SCROTAL ULTRASOUND DOPPLER ULTRASOUND OF THE TESTICLES TECHNIQUE: Complete ultrasound examination of the testicles, epididymis, and other scrotal structures was performed. Color and spectral Doppler ultrasound were also utilized to evaluate blood flow to the testicles. COMPARISON:  None. FINDINGS: Right testicle Measurements: 4.3 x 2.2 x 2.4 cm. No mass or microlithiasis visualized. Left testicle Measurements: 3.5 x 2.2 x 2.8 cm. No solid masses. There are at least 4 subcentimeter cysts, the largest measuring 6 x 7 x 7 mm. Right epididymis:  Mildly enlarged and hyperemic.  No focal lesion. Left epididymis:  Mildly enlarged without focal lesion. Hydrocele:  None visualized. Varicocele:  None visualized. Pulsed Doppler interrogation of both testes demonstrates normal low resistance arterial and venous waveforms bilaterally. IMPRESSION: There are several cysts in the left testis superiorly. No solid testicular masses. Both testes are perfused. Both epididymides mildly enlarged without focal lesion. Electronically Signed   By: Andreas [place] M.D.   On: 12/26/2015 02:43   Dg Chest Port 1 View  12/26/2015  CLINICAL DATA:  Fever tonight. EXAM: PORTABLE CHEST 1 VIEW COMPARISON:  Included lung bases from CT abdomen/pelvis, available at time of radiograph interpretation. FINDINGS: Lung volumes are low. Patient is rotated the left. The cardiomediastinal contours are normal for technique. Pulmonary vasculature is normal. No consolidation, pleural effusion, or pneumothorax. No acute osseous abnormalities are seen. IMPRESSION: Hypoventilatory chest without acute process. Electronically Signed   By: Jeb Levering M.D.   On: 12/26/2015 02:12    Scheduled  Meds: . enoxaparin (LOVENOX) injection  40 mg Subcutaneous Daily  . imipenem-cilastatin  500 mg Intravenous Q8H  . sodium chloride  1,000 mL Intravenous Once  . sodium chloride  1,000 mL Intravenous Once   Continuous Infusions: . sodium chloride      Principal Problem:   Sepsis ([place]) Active Problems:   Hypertension    Time spent: 40  Minutes.     [facility] 2  [facility] Pager [phone] If 7PM-7AM, please contact night-coverage at [email or web address], password [facility] 12/27/2015, 12:15 PM  LOS: 2 days

## 2015-12-27 NOTE — Progress Notes (Signed)
On way to Xray Patient vomitted. Patient complaining of headache and lightheadedness. MD notified. VSS. Will continue to monitor.   Domingo Dimes RN

## 2015-12-27 NOTE — Progress Notes (Signed)
Utilization review completed.  

## 2015-12-27 NOTE — Progress Notes (Signed)
Lab called with critical value of 2.5 of  Lactic acid - DR Karleen Hampshire had called earlier said that if this Lactic Acid was less than 3 than do not give the last liter bolus ordered. Relayed that info to oncoming night RN and made appropriate documentation on MAR.

## 2015-12-27 NOTE — Progress Notes (Signed)
Verbal order from [place] to place pt on Venti Mask at 45% . RN will follow orders       Lilla Shook

## 2015-12-27 NOTE — Progress Notes (Signed)
CRITICAL VALUE ALERT  Critical value received:  3.1  Date of notification:  12/27/2015  Time of notification:  [id number]  Critical value read back: 3.1  Nurse who received alert:  Domingo Dimes  MD notified (1st page):  Venia Minks  Time of first page:  68  MD notified (2nd page):  Time of second page:  Responding MD:  Venia Minks  Time MD responded:  [phone]

## 2015-12-27 NOTE — Progress Notes (Addendum)
Shift event: pt to OR 4/4 for urethral stricture treatment by urology. Admitted 4/3 pm with UTI, ? Epididymitis. UA + and urine culture + 100,000 Ecoli, sensitivities pending. Pt on Zosyn.  Tonight, pt spiked fever of 102.18F rectally. + rigors. Elevated LA. Increased RR but no increased WOB. Pt placed on O2 at 3L for O2 sats 90%. Ordered bolus, Tylenol, Ice, ABG, and labs.  ABG showed low PO2 with normal pH. Increased O2 to 45% VM.  NP to bedside. S: pt feels OK. Denies CP or SOB. States breathing is better after mask placed.  O: Acutely ill appearing AAM in NAD. Non toxic appearing. Alert and oriented. T down to 101.7 rectally. BP 100s. O2 sat up to 99% on VM. RR high 20s. No increased WOB noted.  A/P: 1. Sepsis as evidenced by known infection, hypoxia, tachycardia, hypotension and fever. LA up. Giving another 1L bolus after labs reviewed (creat bumped to 2.09 from 1.41 on admit-known CKD II). CBC with WBCC up to 14.3 from 8.6 on admit. Will change abx to reflect gram neg and gram positive coverage. Follow next LA and continue to hydrate. Follow cultures, blood and urine. Should LA continue to rise, pt have any further respiratory issues, and/or further hypotension- low threshold to transfer to SDU. Will follow closely.  2. Urethral stricture s/p surgery-urology was on floor to see pt.  Clance Boll, NP [facility] Update: LA trending downward to 2.8. Changed abx to Primaxin for broader coverage of gram neg and ESBL. KJKG, NP Update: Morning ABG reported and looks better. PO2 81 now. Continues to sat upper 90s on VM 45%. Tried to wean to 35%, but sats fell. Continue present plan KJKG, NP [facility]

## 2015-12-27 NOTE — Clinical Documentation Improvement (Signed)
Internal Medicine  Based on the clinical findings below, please document any associated diagnoses/conditions the patient has or may have. Please document findings in next progress note if applicable. Thank you.   Acute Hypoxic Respiratory Failure  Respiratory Failure ruled out  Acute Respiratory Distress  Hypoxia  Other Condition  Clinically Undetermined  Supporting Information:  HR > 90, RR ranging from 19 to 35  Oxygen saturations in high 80's in room air; placed in 2L, 3L, 45% via Venti mask  ABG = 7.40 - 27.5 - 60 with acid/base 6.8. O2 sats at draw time = 88%  Please exercise your independent, professional judgment when responding. A specific answer is not anticipated or expected.  Thank You, Zoila Shutter RN, BSN, [place] [phone]; Cell: [phone]

## 2015-12-27 NOTE — Care Management Note (Signed)
Case Management Note Marvetta Gibbons RN, BSN Unit 2W-Case Manager [phone]  Patient Details  Name: [name] MRN: [id number] Date of Birth: 10-26-1955  Subjective/Objective: Pt admitted with sepsis                   Action/Plan: PTA pt lived at home, CM to follow for d/c needs  Expected Discharge Date:                  Expected Discharge Plan:  [place]  In-House Referral:     Discharge planning Services  CM Consult  Post Acute Care Choice:    Choice offered to:     DME Arranged:    DME Agency:     HH Arranged:    HH Agency:     Status of Service:  In process, will continue to follow  Medicare Important Message Given:    Date Medicare IM Given:    Medicare IM give by:    Date Additional Medicare IM Given:    Additional Medicare Important Message give by:     If discussed at [place] of Stay Meetings, dates discussed:    Additional Comments:  Dawayne Patricia, RN 12/27/2015, 10:39 AM

## 2015-12-27 NOTE — Progress Notes (Signed)
ABG collected on 3L

## 2015-12-27 NOTE — Progress Notes (Signed)
CRITICAL VALUE ALERT  Critical value received:  Lactic Acid 2.8  Date of notification:  12/27/15  Time of notification:  0116  Critical value read back:yes  Nurse who received alert:  Thoms Barthelemy  MD notified (1st page):  Baltazar Najjar  Time of first page:  0117  MD notified (2nd page):  Time of second page:  Responding MD:  Baltazar Najjar  Time MD responded:  [phone]

## 2015-12-27 NOTE — Progress Notes (Signed)
Per Baltazar Najjar continue fluids at 1107ml/hr.   Arnell Sieving, RN

## 2015-12-27 NOTE — Progress Notes (Signed)
Pharmacy Antibiotic Note  [name] is a 60 y.o. male with urosepsis/ochitis and persistent fevers.  Pharmacy has been consulted for Primaxin dosing.  Urine Cx growing E.coli, sensitivities pending.  Plan: Primaxin 500 mg IV q8h  Height: 5\' 9"  (175.3 cm) Weight: 220 lb (99.791 kg) IBW/kg (Calculated) : 70.7  Temp (24hrs), Avg:101.2 F (38.4 C), Min:99.7 F (37.6 C), Max:102.8 F (39.3 C)   Recent Labs Lab 12/25/15 1536  12/26/15 0346  12/26/15 1252 12/26/15 1533 12/26/15 1852 12/26/15 2203 12/26/15 2332 12/27/15 0025  WBC 9.0  --  8.6  --   --   --   --   --  14.3*  --   CREATININE 1.41*  --  1.45*  --   --   --   --   --  2.09*  --   LATICACIDVEN  --   < >  --   < > 2.76* 2.7* 3.5* 3.3*  --  2.8*  < > = values in this interval not displayed.  Estimated Creatinine Clearance: 44.8 mL/min (by C-G formula based on Cr of 2.09).    Allergies  Allergen Reactions  . Lisinopril Cough    Antimicrobials this admission: Zosyn  4/4 >> 4/5 Primaxin 4/5 >>   Microbiology results: 4/3  BCx:  4/3 UCx:  E.coli  2/14 UCx:  Group B Strep  Caryl Pina 12/27/2015 1:45 AM

## 2015-12-27 NOTE — Significant Event (Signed)
Rapid Response Event Note  Overview:  Follow up on patient from RRT follow up list Time Called: 1330 Arrival Time: 1330 Event Type: Other (Comment)  Initial Focused Assessment:  Patient known from this AM with persistent elevated LA  despite 7 liters of NS since last pm - now with nausea and vomiting.  Patient supine in bed - moaning c/o headache and nausea.  Skin hot to touch - some intermittent diaphoresis - rectal temp 99.9.  Continues to deny abd pain -abd soft - no pain with palpatation - nondistended.  Foley patent - yellow urine.  No focal neuro sx noted.  SR on monitor.  Resps rapid 32-38 - grunts with resps.  Denies SOB on RA with O2 sats 94%.  Bil BS clear.  BP 124/75 HR 93.    NS infusing at 250cc hr.  Dr. Karleen Hampshire notified PTA RRT by RN Doretha Sou.  Patient just returning from radiology and reports vomiting x 2.  RN reports he went to bathroom and had large BM.     Interventions:  Transported to CT scan - brain CT.  Patient with intermittent periods of confusion.  Remains tachypneic 32-38.  Family member here now states he "grunts" with breathing at home.    Again with vomiting post transport to CT. Patient also experiencing chills and diahporeses intermittently.  Staff concerned with continued need for fluid bolus and persistent LA.  Spoke with Dr. Karleen Hampshire - expressed concern with the LA - updated with continued vomiting and intermittent confusion and some increased lethargy.  Staff expressing need for increased monitoring - order received for SDU transfer.  Labs ordered.  Will follow.  Follow Up:  PO2 53 on RA - placed on 4 liter nasal cannula.  Patient found in bathroom - walked himself - more alert - had soft brown stool with blood present - continue to deny abd pain.  Dr. Karleen Hampshire on unit - updated.  Labs still pending.  For transfer to [facility].  Handoff to [employer] - to call as needed.    Event Summary:   at      at    Outcome: Transferred (Comment) (2c)     Quin Hoop

## 2015-12-28 DIAGNOSIS — I10 Essential (primary) hypertension: Secondary | ICD-10-CM

## 2015-12-28 LAB — COMPREHENSIVE METABOLIC PANEL
ALK PHOS: 45 U/L (ref 38–126)
ALT: 41 U/L (ref 17–63)
AST: 46 U/L — AB (ref 15–41)
Albumin: 2.3 g/dL — ABNORMAL LOW (ref 3.5–5.0)
Anion gap: 10 (ref 5–15)
BILIRUBIN TOTAL: 1 mg/dL (ref 0.3–1.2)
BUN: 11 mg/dL (ref 6–20)
CALCIUM: 6.9 mg/dL — AB (ref 8.9–10.3)
CHLORIDE: 111 mmol/L (ref 101–111)
CO2: 17 mmol/L — ABNORMAL LOW (ref 22–32)
CREATININE: 1.32 mg/dL — AB (ref 0.61–1.24)
GFR calc Af Amer: >60 mL/min (ref >60)
GFR, EST NON AFRICAN AMERICAN: 58 mL/min — AB (ref >60)
Glucose, Bld: 105 mg/dL — ABNORMAL HIGH (ref 65–99)
Potassium: 3.3 mmol/L — ABNORMAL LOW (ref 3.5–5.1)
Sodium: 138 mmol/L (ref 135–145)
Total Protein: 5.3 g/dL — ABNORMAL LOW (ref 6.5–8.1)

## 2015-12-28 LAB — CBC
HEMATOCRIT: 28.6 % — AB (ref 39.0–52.0)
HEMATOCRIT: 29.8 % — AB (ref 39.0–52.0)
HEMOGLOBIN: 9.7 g/dL — AB (ref 13.0–17.0)
Hemoglobin: 10 g/dL — ABNORMAL LOW (ref 13.0–17.0)
MCH: 28.1 pg (ref 26.0–34.0)
MCH: 27.4 pg (ref 26.0–34.0)
MCHC: 33.9 g/dL (ref 30.0–36.0)
MCHC: 33.6 g/dL (ref 30.0–36.0)
MCV: 82.9 fL (ref 78.0–100.0)
MCV: 81.6 fL (ref 78.0–100.0)
PLATELETS: 65 10*3/uL — AB (ref 150–400)
Platelets: 80 10*3/uL — ABNORMAL LOW (ref 150–400)
RBC: 3.45 MIL/uL — AB (ref 4.22–5.81)
RBC: 3.65 MIL/uL — ABNORMAL LOW (ref 4.22–5.81)
RDW: 15.4 % (ref 11.5–15.5)
RDW: 15 % (ref 11.5–15.5)
WBC: 16.9 10*3/uL — AB (ref 4.0–10.5)
WBC: 15.4 10*3/uL — ABNORMAL HIGH (ref 4.0–10.5)

## 2015-12-28 LAB — SAVE SMEAR

## 2015-12-28 LAB — MAGNESIUM: MAGNESIUM: 2.1 mg/dL (ref 1.7–2.4)

## 2015-12-28 LAB — HAPTOGLOBIN: Haptoglobin: 198 mg/dL (ref 34–200)

## 2015-12-28 MED ORDER — POTASSIUM CHLORIDE CRYS ER 20 MEQ PO TBCR
40.0000 meq | EXTENDED_RELEASE_TABLET | Freq: Two times a day (BID) | ORAL | Status: AC
  Administered 2015-12-28 – 2015-12-29 (×2): 40 meq via ORAL
  Filled 2015-12-28 (×2): qty 2

## 2015-12-28 MED ORDER — VITAMIN B-12 1000 MCG PO TABS
500.0000 ug | ORAL_TABLET | Freq: Every day | ORAL | Status: DC
  Administered 2015-12-28 – 2016-01-01 (×5): 500 ug via ORAL
  Filled 2015-12-28 (×5): qty 1

## 2015-12-28 MED ORDER — PROCHLORPERAZINE EDISYLATE 5 MG/ML IJ SOLN
5.0000 mg | INTRAMUSCULAR | Status: DC | PRN
  Administered 2015-12-28: 5 mg via INTRAVENOUS
  Filled 2015-12-28 (×3): qty 1

## 2015-12-28 NOTE — Progress Notes (Signed)
[facility] PROGRESS NOTE  PEREZ MICKELS [id number] DOB: 11/22/1955 DOA: 12/25/2015 PCP: Nyoka Cowden, MD Interim summary: R Mcgarty is a 60 y.o. male history of hypertension chronic low back pain presents to the ER because of fever and chills. He was found to have sepsis from UTI, and epididymal orchitis. He was started on broad spectrum antibiotics. He also had urinary retention, urology consulted and catheter was placed using cystoscopy.  Assessment/Plan: 1. Sepsis from pyelonephritis and epididymal orchitis: - he waas initially on zosyn , changed to Primaxin earlier on 4/5, blood cultures have been negative so far.  - urine cultures grew E coli sensitive to rocephin and primaxin and keflex. Plan to narrow the antibiotics to oral when able to keep it down.  - decrease the rate of the fluids and his lactic acid levels improved and his bp improved.     2. Acute respiratory failure of unclear etiology: Repeat CXR ordered on 4/5 and showed small bilateral pleural effusions with slight pulmonary vascular congestion. ABG on 4/5  shows PH OF 7.46, BICARB of 15.8, Pco2 of 22 and po2 of 53.   3. Urinary retention possibly from stricture formation: Urology consulted and he underwent catheter placement using cystoscopy. It should be removed in 10 days, a voiding trail set up in [facility] in 10 days.   4. 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  Improving.  Monitor.   5. Chronic back pain: On pain meds.    6. Hypertension: His bp has been soft since admission and it has improved today.  Monitor off bp meds.   7. Metabolic acidosis / lactic acidosis possibly from sepsis: Bicarb is 17, improving.  Lactic acid improved.  His mentation is good, alert and oriented to place, person and time.   8. Anemia and thrombocytopenia: - BRBPR  One episode yesterday,  - hemoglobin on admission was 14, but it appears his baseline hemoglobin  is around 12 and has dropped to 10 to 9.9.  - anemia panel, LDH, haptoglobin ordered.   H/o thrombocytopenia in the past, continue to monitor.  Ordered smear for evaluation.    9. Elevated bilirubin and AST: with nausea and vomiting today.  US abdomen shows steatosis.  Vomiting improved.    10. Hypokalemia and hypomagnesemia: Replete as needed and repeat in am.   Code Status: full code.  Family Communication: discussed with family at bedside Disposition Plan: plan to transfer to telemetry today.    Consultants:  Urology.   Procedures:  Cystoscopy.  Antibiotics:  primaxin started on 4/5  HPI/Subjective: Reports headache .  Objective: Filed Vitals:   12/28/15 0731 12/28/15 1139  BP: 130/88 126/77  Pulse: 73 76  Temp: 98.4 F (36.9 C) 99.3 F (37.4 C)  Resp: 16 26    Intake/Output Summary (Last 24 hours) at 12/28/15 1410 Last data filed at 12/28/15 1255  Gross per 24 hour  Intake   3856 ml  Output   3601 ml  Net    255 ml   Filed Weights   12/27/15 1654 12/28/15 0124 12/28/15 0357  Weight: 105.1 kg (231 lb 11.3 oz) 102.7 kg (226 lb 6.6 oz) 105.9 kg (233 lb 7.5 oz)    Exam:   General:  Alert and off oxygen  Cardiovascular: s1s2  Respiratory: ctab  Abdomen: soft non tender non distended bowel sounds heard  Musculoskeletal: trace pedal edema   Data Reviewed: Basic Metabolic Panel:  Recent Labs Lab 12/26/15  [id number] 12/26/15 2332 12/27/15 0442 12/27/15 1434 12/28/15 0324  NA 137 139 141 139 138  K 3.6 3.5 3.9 3.3* 3.3*  CL 107 111 113* 111 111  CO2 20* 16* 14* 17* 17*  GLUCOSE 99 89 78 90 105*  BUN 17 17 15 13 11   CREATININE 1.45* 2.09* 1.84* 1.62* 1.32*  CALCIUM 8.0* 7.0* 6.9* 7.0* 6.9*  MG  --   --   --  1.2* 2.1   Liver Function Tests:  Recent Labs Lab 12/25/15 1536 12/26/15 0346 12/26/15 2332 12/27/15 1434 12/28/15 0324  AST 32 25 49* 54* 46*  ALT 33 23 38 47 41  ALKPHOS 36* 29* 33* 77 45  BILITOT 0.8 0.7 1.0 1.5* 1.0   PROT 8.4* 6.3* 5.3* 5.7* 5.3*  ALBUMIN 4.4 3.1* 2.4* 2.5* 2.3*   No results for input(s): LIPASE, AMYLASE in the last 168 hours. No results for input(s): AMMONIA in the last 168 hours. CBC:  Recent Labs Lab 12/26/15 0346 12/26/15 2332 12/27/15 0442 12/27/15 1434 12/28/15 0324 12/28/15 1027  WBC 8.6 14.3* 15.5* 15.2* 16.9* 15.4*  NEUTROABS 5.8 12.8*  --   --   --   --   HGB 12.3* 9.7* 10.2* 9.9* 9.7* 10.0*  HCT 35.8* 29.4* 29.8* 29.3* 28.6* 29.8*  MCV 83.6 83.3 83.7 82.3 82.9 81.6  PLT 117* 64* 61* 58* 65* 80*   Cardiac Enzymes: No results for input(s): CKTOTAL, CKMB, CKMBINDEX, TROPONINI in the last 168 hours. BNP (last 3 results) No results for input(s): BNP in the last 8760 hours.  ProBNP (last 3 results) No results for input(s): PROBNP in the last 8760 hours.  CBG:  Recent Labs Lab 12/27/15 1426  GLUCAP 86    Recent Results (from the past 240 hour(s))  Urine culture     Status: None   Collection Time: 12/25/15 10:07 PM  Result Value Ref Range Status   Specimen Description URINE, CLEAN CATCH  Final   Special Requests NONE  Final   Culture >=100,000 COLONIES/mL ESCHERICHIA COLI  Final   Report Status 12/27/2015 FINAL  Final   Organism ID, Bacteria ESCHERICHIA COLI  Final      Susceptibility   Escherichia coli - MIC*    AMPICILLIN >=32 RESISTANT Resistant     CEFAZOLIN <=4 SENSITIVE Sensitive     CEFTRIAXONE <=1 SENSITIVE Sensitive     CIPROFLOXACIN >=4 RESISTANT Resistant     GENTAMICIN >=16 RESISTANT Resistant     IMIPENEM <=0.25 SENSITIVE Sensitive     NITROFURANTOIN <=16 SENSITIVE Sensitive     TRIMETH/SULFA <=20 SENSITIVE Sensitive     AMPICILLIN/SULBACTAM 16 INTERMEDIATE Intermediate     PIP/TAZO <=4 SENSITIVE Sensitive     * >=100,000 COLONIES/mL ESCHERICHIA COLI  Blood Culture (routine x 2)     Status: None (Preliminary result)   Collection Time: 12/25/15 10:13 PM  Result Value Ref Range Status   Specimen Description BLOOD LEFT ARM  Final    Special Requests BOTTLES DRAWN AEROBIC AND ANAEROBIC 3ML  Final   Culture NO GROWTH 2 DAYS  Final   Report Status PENDING  Incomplete  Blood Culture (routine x 2)     Status: None (Preliminary result)   Collection Time: 12/25/15 10:32 PM  Result Value Ref Range Status   Specimen Description BLOOD RIGHT HAND  Final   Special Requests AEROBIC BOTTLE ONLY 10ML  Final   Culture NO GROWTH 2 DAYS  Final   Report Status PENDING  Incomplete  Culture, blood (Routine X 2)  w Reflex to ID Panel     Status: None (Preliminary result)   Collection Time: 12/27/15  4:26 PM  Result Value Ref Range Status   Specimen Description BLOOD RIGHT ANTECUBITAL  Final   Special Requests IN PEDIATRIC BOTTLE 3.5CC  Final   Culture NO GROWTH < 24 HOURS  Final   Report Status PENDING  Incomplete  Culture, blood (Routine X 2) w Reflex to ID Panel     Status: None (Preliminary result)   Collection Time: 12/27/15  4:32 PM  Result Value Ref Range Status   Specimen Description BLOOD RIGHT HAND  Final   Special Requests IN PEDIATRIC BOTTLE  3.5CC  Final   Culture NO GROWTH < 24 HOURS  Final   Report Status PENDING  Incomplete  MRSA PCR Screening     Status: None   Collection Time: 12/27/15  5:16 PM  Result Value Ref Range Status   MRSA by PCR NEGATIVE NEGATIVE Final    Comment:        The GeneXpert MRSA Assay (FDA approved for NASAL specimens only), is one component of a comprehensive MRSA colonization surveillance program. It is not intended to diagnose MRSA infection nor to guide or monitor treatment for MRSA infections.      Studies: Dg Chest 2 View  12/27/2015  CLINICAL DATA:  Fever.  Nausea and vomiting. EXAM: CHEST  2 VIEW COMPARISON:  12/26/2015 and 05/04/2010 FINDINGS: There new small bilateral pleural effusions. Slight distention of the azygos vein. Heart size is normal. No lung consolidation. The patient has congenital small Bochdalek's hernias, more prominent on the left than the right. IMPRESSION: New  small bilateral pleural effusions with slight pulmonary vascular congestion. Electronically Signed   By: Lorriane Shire M.D.   On: 12/27/2015 12:48   Ct Head Wo Contrast  12/27/2015  CLINICAL DATA:  Headache beginning this morning. Nausea and vomiting. EXAM: CT HEAD WITHOUT CONTRAST TECHNIQUE: Contiguous axial images were obtained from the base of the skull through the vertex without intravenous contrast. COMPARISON:  05/07/2010 FINDINGS: The ventricles are normal in size and configuration. There are no parenchymal masses or mass effect, no evidence of an infarct, no extra-axial masses or abnormal fluid collections and no intracranial hemorrhage. The visualized sinuses and mastoid air cells are clear. No skull lesion. IMPRESSION: Normal unenhanced CT scan of the brain. Electronically Signed   By: Lajean Manes M.D.   On: 12/27/2015 13:52   US Abdomen Complete  12/27/2015  CLINICAL DATA:  Elevated bilirubin and pyelonephritis. EXAM: ABDOMEN ULTRASOUND COMPLETE COMPARISON:  CT of the abdomen and pelvis without contrast on 12/26/2015. FINDINGS: Gallbladder: No gallstones or wall thickening visualized. No sonographic Murphy sign noted by sonographer. Common bile duct: Diameter: Normal caliber of 4 mm. Liver: The liver demonstrates coarse echotexture and markedly increased echogenicity, likely reflecting diffuse steatosis. No overt cirrhotic contour abnormalities or focal lesions are identified. There is no evidence of intrahepatic biliary ductal dilatation. The portal vein is open. IVC: No abnormality visualized. Pancreas: Visualized portion unremarkable. Spleen: Size and appearance within normal limits. Right Kidney: Length: 10.8 cm. Resolved hydronephrosis since the CT scan. No masses. Left Kidney: Length: 11.4 cm. Hydronephrosis has resolved since CT. No masses are identified. Abdominal aorta: No aneurysm visualized. Other findings: No ascites. IMPRESSION: 1. Markedly echogenic liver consistent with steatosis. No  overt cirrhotic changes. No hepatic masses identified by ultrasound. 2. No evidence of biliary obstruction. 3. The kidneys have a normal appearance by ultrasound with resolution of bilateral hydronephrosis  seen on the CT study yesterday which was presumably related to bladder distention. Electronically Signed   By: Aletta Edouard M.D.   On: 12/27/2015 19:39    Scheduled Meds: . imipenem-cilastatin  500 mg Intravenous Q8H  . potassium chloride  40 mEq Oral BID  . sodium chloride  1,000 mL Intravenous Once  . vitamin B-12  500 mcg Oral Daily   Continuous Infusions: . sodium chloride 150 mL/hr at 12/28/15 1133    Principal Problem:   Sepsis ([place]) Active Problems:   Hypertension    Time spent: 40  Minutes.     [facility]  [facility] Pager [phone] If 7PM-7AM, please contact night-coverage at [email or web address], password [facility] 12/28/2015, 2:10 PM  LOS: 3 days

## 2015-12-29 ENCOUNTER — Encounter (HOSPITAL_COMMUNITY): Payer: Self-pay | Admitting: *Deleted

## 2015-12-29 ENCOUNTER — Inpatient Hospital Stay (HOSPITAL_COMMUNITY): Payer: BLUE CROSS/BLUE SHIELD

## 2015-12-29 DIAGNOSIS — R17 Unspecified jaundice: Secondary | ICD-10-CM | POA: Insufficient documentation

## 2015-12-29 DIAGNOSIS — N453 Epididymo-orchitis: Secondary | ICD-10-CM

## 2015-12-29 DIAGNOSIS — N451 Epididymitis: Secondary | ICD-10-CM | POA: Insufficient documentation

## 2015-12-29 DIAGNOSIS — N452 Orchitis: Secondary | ICD-10-CM | POA: Insufficient documentation

## 2015-12-29 DIAGNOSIS — N5082 Scrotal pain: Secondary | ICD-10-CM | POA: Insufficient documentation

## 2015-12-29 DIAGNOSIS — R109 Unspecified abdominal pain: Secondary | ICD-10-CM | POA: Insufficient documentation

## 2015-12-29 DIAGNOSIS — R519 Headache, unspecified: Secondary | ICD-10-CM | POA: Insufficient documentation

## 2015-12-29 DIAGNOSIS — R509 Fever, unspecified: Secondary | ICD-10-CM

## 2015-12-29 DIAGNOSIS — R51 Headache: Secondary | ICD-10-CM

## 2015-12-29 LAB — CBC WITH DIFFERENTIAL/PLATELET
Basophils Absolute: 0 10*3/uL (ref 0.0–0.1)
Basophils Relative: 0 %
EOS ABS: 0.2 10*3/uL (ref 0.0–0.7)
EOS PCT: 1 %
HCT: 30.3 % — ABNORMAL LOW (ref 39.0–52.0)
Hemoglobin: 10.4 g/dL — ABNORMAL LOW (ref 13.0–17.0)
LYMPHS ABS: 2.3 10*3/uL (ref 0.7–4.0)
Lymphocytes Relative: 16 %
MCH: 27.7 pg (ref 26.0–34.0)
MCHC: 34.3 g/dL (ref 30.0–36.0)
MCV: 80.8 fL (ref 78.0–100.0)
MONOS PCT: 6 %
Monocytes Absolute: 0.8 10*3/uL (ref 0.1–1.0)
Neutro Abs: 10.8 10*3/uL — ABNORMAL HIGH (ref 1.7–7.7)
Neutrophils Relative %: 77 %
PLATELETS: 120 10*3/uL — AB (ref 150–400)
RBC: 3.75 MIL/uL — ABNORMAL LOW (ref 4.22–5.81)
RDW: 14.5 % (ref 11.5–15.5)
WBC: 14.1 10*3/uL — ABNORMAL HIGH (ref 4.0–10.5)

## 2015-12-29 LAB — CBC
HEMATOCRIT: 29.4 % — AB (ref 39.0–52.0)
HEMOGLOBIN: 10.1 g/dL — AB (ref 13.0–17.0)
MCH: 27.8 pg (ref 26.0–34.0)
MCHC: 34.4 g/dL (ref 30.0–36.0)
MCV: 81 fL (ref 78.0–100.0)
Platelets: 97 10*3/uL — ABNORMAL LOW (ref 150–400)
RBC: 3.63 MIL/uL — ABNORMAL LOW (ref 4.22–5.81)
RDW: 14.6 % (ref 11.5–15.5)
WBC: 15.9 10*3/uL — ABNORMAL HIGH (ref 4.0–10.5)

## 2015-12-29 LAB — BASIC METABOLIC PANEL
ANION GAP: 8 (ref 5–15)
BUN: 9 mg/dL (ref 6–20)
CALCIUM: 7.4 mg/dL — AB (ref 8.9–10.3)
CO2: 19 mmol/L — AB (ref 22–32)
Chloride: 110 mmol/L (ref 101–111)
Creatinine, Ser: 1.2 mg/dL (ref 0.61–1.24)
GFR calc Af Amer: >60 mL/min (ref >60)
GFR calc non Af Amer: >60 mL/min (ref >60)
GLUCOSE: 82 mg/dL (ref 65–99)
POTASSIUM: 3.6 mmol/L (ref 3.5–5.1)
Sodium: 137 mmol/L (ref 135–145)

## 2015-12-29 MED ORDER — DEXTROSE 5 % IV SOLN
2.0000 g | INTRAVENOUS | Status: DC
  Administered 2015-12-29 – 2015-12-31 (×3): 2 g via INTRAVENOUS
  Filled 2015-12-29 (×3): qty 2

## 2015-12-29 MED ORDER — IOHEXOL 300 MG/ML  SOLN
25.0000 mL | INTRAMUSCULAR | Status: AC
  Administered 2015-12-29 (×2): 25 mL via ORAL

## 2015-12-29 MED ORDER — IMIPENEM-CILASTATIN 500 MG IV SOLR
500.0000 mg | Freq: Four times a day (QID) | INTRAVENOUS | Status: DC
  Filled 2015-12-29 (×2): qty 500

## 2015-12-29 MED ORDER — IOPAMIDOL (ISOVUE-300) INJECTION 61%
INTRAVENOUS | Status: AC
  Administered 2015-12-29: 100 mL
  Filled 2015-12-29: qty 100

## 2015-12-29 NOTE — Consult Note (Signed)
Date of Admission:  12/25/2015  Date of Consult:  12/29/2015  Reason for Consult: FUO Referring Physician: Dr. Karleen Hampshire   HPI:  [name] is an 60 y.o. male with history of hypertension prior episode of urinary tract infection and orchitis occurred after urological procedure was admitted the hospital with low back pain fevers chills tenderness in his scrotum and dysuria with urinary retention. CT scan of the abdomen and pelvis:  1. Marked urinary bladder distention, up above the level of the umbilicus. Hydronephrosis bilaterally is probably secondary to the bladder distention. 2. Small hiatal hernia. 3. Diverticulosis.   He was evaluated in the emergency department and admitted to the hospitalist seem. Urology were consulted and were able to place a catheter to relieve his urinary retention using cystoscopy.    Urine cultures have yielded Escherichia coli and he was treated initially with ceftriaxone. He was having persistently high fevers up to 103 with leukocytosis of the 15 16,000 range and his antibiotics were changed from ceftriaxone to Zosyn and then imipenem. Despite broad-spectrum coverage she is continue to have fevers temp this morning up to 102. On exam today is drenched in sweat after fevers and also been suffering from headaches that have been refractory to treatment with his medications.   Past Medical History  Diagnosis Date  . Hypertension   . Sepsis ([place]) hospitalized 12/25/2015  . History of gout     Past Surgical History  Procedure Laterality Date  . Inguinal hernia repair Right     Social History:  reports that he has never smoked. He has never used smokeless tobacco. He reports that he drinks about 3.6 oz of alcohol per week. He reports that he does not use illicit drugs.   Family History  Problem Relation Age of Onset  . Diabetes Mellitus II Paternal Grandfather     Allergies  Allergen Reactions  . Lisinopril Cough     Medications: I  have reviewed patients current medications as documented in Epic Anti-infectives    Start     Dose/Rate Route Frequency Ordered Stop   12/29/15 1600  imipenem-cilastatin (PRIMAXIN) 500 mg in sodium chloride 0.9 % 100 mL IVPB  Status:  Discontinued     500 mg 200 mL/hr over 30 Minutes Intravenous Every 6 hours 12/29/15 1043 12/29/15 1054   12/29/15 1200  cefTRIAXone (ROCEPHIN) 2 g in dextrose 5 % 50 mL IVPB     2 g 100 mL/hr over 30 Minutes Intravenous Every 24 hours 12/29/15 1054     12/27/15 0200  imipenem-cilastatin (PRIMAXIN) 500 mg in sodium chloride 0.9 % 100 mL IVPB  Status:  Discontinued     500 mg 200 mL/hr over 30 Minutes Intravenous Every 8 hours 12/27/15 0152 12/29/15 1043   12/26/15 2200  cefTRIAXone (ROCEPHIN) 1 g in dextrose 5 % 50 mL IVPB  Status:  Discontinued     1 g 100 mL/hr over 30 Minutes Intravenous Every 24 hours 12/25/15 2140 12/26/15 0920   12/26/15 1600  piperacillin-tazobactam (ZOSYN) IVPB 3.375 g  Status:  Discontinued     3.375 g 12.5 mL/hr over 240 Minutes Intravenous Every 8 hours 12/26/15 0933 12/27/15 0127   12/26/15 0945  piperacillin-tazobactam (ZOSYN) IVPB 3.375 g     3.375 g 100 mL/hr over 30 Minutes Intravenous  Once 12/26/15 0933 12/26/15 1145   12/26/15 0130  cefTRIAXone (ROCEPHIN) 2 g in dextrose 5 % 50 mL IVPB     2 g 100 mL/hr  over 30 Minutes Intravenous  Once 12/26/15 0127 12/26/15 0206   12/25/15 2145  cefTRIAXone (ROCEPHIN) 2 g in dextrose 5 % 50 mL IVPB     2 g 100 mL/hr over 30 Minutes Intravenous  Once 12/25/15 2139 12/25/15 2301         ROS: as in HPI otherwise remainder of 12 point Review of Systems is negative  Blood pressure 131/84, pulse 98, temperature 99 F (37.2 C), temperature source Oral, resp. rate 23, height 5' 9"  (1.753 m), weight 233 lb 7.5 oz (105.9 kg), SpO2 95 %. General: Alert and awake, oriented x3,Diaphoretic  HEENT: anicteric sclera,  EOMI, oropharynx clear and without exudate Cardiovascular: regular rate,  normal r,  no murmur rubs or gallops Pulmonary: clear to auscultation bilaterally, no wheezing, rales or rhonchi Gastrointestinal: soft nontender, nondistended, normal bowel sounds, Musculoskeletal: no  clubbing or edema noted bilaterally Skin, 2+ edema of his lower extremities  GU: He has significant scrotal edema with some thickening on the left side Neuro: nonfocal, strength and sensation intact   Results for orders placed or performed during the hospital encounter of 12/25/15 (from the past 48 hour(s))  Culture, blood (Routine X 2) w Reflex to ID Panel     Status: None (Preliminary result)   Collection Time: 12/27/15  4:26 PM  Result Value Ref Range   Specimen Description BLOOD RIGHT ANTECUBITAL    Special Requests IN PEDIATRIC BOTTLE 3.5CC    Culture NO GROWTH 2 DAYS    Report Status PENDING   Culture, blood (Routine X 2) w Reflex to ID Panel     Status: None (Preliminary result)   Collection Time: 12/27/15  4:32 PM  Result Value Ref Range   Specimen Description BLOOD RIGHT HAND    Special Requests IN PEDIATRIC BOTTLE  3.5CC    Culture NO GROWTH 2 DAYS    Report Status PENDING   MRSA PCR Screening     Status: None   Collection Time: 12/27/15  5:16 PM  Result Value Ref Range   MRSA by PCR NEGATIVE NEGATIVE    Comment:        The GeneXpert MRSA Assay (FDA approved for NASAL specimens only), is one component of a comprehensive MRSA colonization surveillance program. It is not intended to diagnose MRSA infection nor to guide or monitor treatment for MRSA infections.   Vitamin B12     Status: None   Collection Time: 12/27/15  6:22 PM  Result Value Ref Range   Vitamin B-12 216 180 - 914 pg/mL    Comment: (NOTE) This assay is not validated for testing neonatal or myeloproliferative syndrome specimens for Vitamin B12 levels.   Folate     Status: None   Collection Time: 12/27/15  6:22 PM  Result Value Ref Range   Folate 6.3 >5.9 ng/mL  Iron and TIBC     Status:  Abnormal   Collection Time: 12/27/15  6:22 PM  Result Value Ref Range   Iron 12 (L) 45 - 182 ug/dL   TIBC 109 (L) 250 - 450 ug/dL   Saturation Ratios 11 (L) 17.9 - 39.5 %   UIBC 97 ug/dL  Ferritin     Status: Abnormal   Collection Time: 12/27/15  6:22 PM  Result Value Ref Range   Ferritin 887 (H) 24 - 336 ng/mL  Reticulocytes     Status: Abnormal   Collection Time: 12/27/15  6:22 PM  Result Value Ref Range   Retic Ct Pct 1.1 0.4 -  3.1 %   RBC. 3.38 (L) 4.22 - 5.81 MIL/uL   Retic Count, Manual 37.2 19.0 - 186.0 K/uL  Haptoglobin     Status: None   Collection Time: 12/27/15  6:22 PM  Result Value Ref Range   Haptoglobin 198 34 - 200 mg/dL    Comment: (NOTE) Performed At: [facility] [place], [place] [id number] Lindon Romp MD IR:[id number]   Lactate dehydrogenase     Status: Abnormal   Collection Time: 12/27/15  6:22 PM  Result Value Ref Range   LDH 220 (H) 98 - 192 U/L  Lactic acid, plasma     Status: Abnormal   Collection Time: 12/27/15  6:22 PM  Result Value Ref Range   Lactic Acid, Venous 2.5 (HH) 0.5 - 2.0 mmol/L    Comment: CRITICAL RESULT CALLED TO, READ BACK BY AND VERIFIED WITH: T. HOOD RN [phone] [id number] GREEN R   Lactic acid, plasma     Status: None   Collection Time: 12/27/15  9:46 PM  Result Value Ref Range   Lactic Acid, Venous 1.9 0.5 - 2.0 mmol/L  CBC     Status: Abnormal   Collection Time: 12/28/15  3:24 AM  Result Value Ref Range   WBC 16.9 (H) 4.0 - 10.5 K/uL   RBC 3.45 (L) 4.22 - 5.81 MIL/uL   Hemoglobin 9.7 (L) 13.0 - 17.0 g/dL   HCT 28.6 (L) 39.0 - 52.0 %   MCV 82.9 78.0 - 100.0 fL   MCH 28.1 26.0 - 34.0 pg   MCHC 33.9 30.0 - 36.0 g/dL   RDW 15.4 11.5 - 15.5 %   Platelets 65 (L) 150 - 400 K/uL    Comment: CONSISTENT WITH PREVIOUS RESULT  Comprehensive metabolic panel     Status: Abnormal   Collection Time: 12/28/15  3:24 AM  Result Value Ref Range   Sodium 138 135 - 145 mmol/L   Potassium 3.3 (L) 3.5 - 5.1 mmol/L    Chloride 111 101 - 111 mmol/L   CO2 17 (L) 22 - 32 mmol/L   Glucose, Bld 105 (H) 65 - 99 mg/dL   BUN 11 6 - 20 mg/dL   Creatinine, Ser 1.32 (H) 0.61 - 1.24 mg/dL   Calcium 6.9 (L) 8.9 - 10.3 mg/dL   Total Protein 5.3 (L) 6.5 - 8.1 g/dL   Albumin 2.3 (L) 3.5 - 5.0 g/dL   AST 46 (H) 15 - 41 U/L   ALT 41 17 - 63 U/L   Alkaline Phosphatase 45 38 - 126 U/L   Total Bilirubin 1.0 0.3 - 1.2 mg/dL   GFR calc non Af Amer 58 (L) >60 mL/min   GFR calc Af Amer >60 >60 mL/min    Comment: (NOTE) The eGFR has been calculated using the CKD EPI equation. This calculation has not been validated in all clinical situations. eGFR's persistently <60 mL/min signify possible Chronic Kidney Disease.    Anion gap 10 5 - 15  Magnesium     Status: None   Collection Time: 12/28/15  3:24 AM  Result Value Ref Range   Magnesium 2.1 1.7 - 2.4 mg/dL  CBC     Status: Abnormal   Collection Time: 12/28/15 10:27 AM  Result Value Ref Range   WBC 15.4 (H) 4.0 - 10.5 K/uL   RBC 3.65 (L) 4.22 - 5.81 MIL/uL   Hemoglobin 10.0 (L) 13.0 - 17.0 g/dL   HCT 29.8 (L) 39.0 - 52.0 %   MCV 81.6 78.0 -  100.0 fL   MCH 27.4 26.0 - 34.0 pg   MCHC 33.6 30.0 - 36.0 g/dL   RDW 15.0 11.5 - 15.5 %   Platelets 80 (L) 150 - 400 K/uL    Comment: CONSISTENT WITH PREVIOUS RESULT  Save smear     Status: None   Collection Time: 12/28/15 10:27 AM  Result Value Ref Range   Smear Review SMEAR STAINED AND AVAILABLE FOR REVIEW   Basic metabolic panel     Status: Abnormal   Collection Time: 12/29/15  3:30 AM  Result Value Ref Range   Sodium 137 135 - 145 mmol/L   Potassium 3.6 3.5 - 5.1 mmol/L   Chloride 110 101 - 111 mmol/L   CO2 19 (L) 22 - 32 mmol/L   Glucose, Bld 82 65 - 99 mg/dL   BUN 9 6 - 20 mg/dL   Creatinine, Ser 1.20 0.61 - 1.24 mg/dL   Calcium 7.4 (L) 8.9 - 10.3 mg/dL   GFR calc non Af Amer >60 >60 mL/min   GFR calc Af Amer >60 >60 mL/min    Comment: (NOTE) The eGFR has been calculated using the CKD EPI equation. This  calculation has not been validated in all clinical situations. eGFR's persistently <60 mL/min signify possible Chronic Kidney Disease.    Anion gap 8 5 - 15  CBC     Status: Abnormal   Collection Time: 12/29/15  3:30 AM  Result Value Ref Range   WBC 15.9 (H) 4.0 - 10.5 K/uL   RBC 3.63 (L) 4.22 - 5.81 MIL/uL   Hemoglobin 10.1 (L) 13.0 - 17.0 g/dL   HCT 29.4 (L) 39.0 - 52.0 %   MCV 81.0 78.0 - 100.0 fL   MCH 27.8 26.0 - 34.0 pg   MCHC 34.4 30.0 - 36.0 g/dL   RDW 14.6 11.5 - 15.5 %   Platelets 97 (L) 150 - 400 K/uL    Comment: CONSISTENT WITH PREVIOUS RESULT   @BRIEFLABTABLE (sdes,specrequest,cult,reptstatus)   ) Recent Results (from the past 720 hour(s))  Urine culture     Status: None   Collection Time: 12/25/15 10:07 PM  Result Value Ref Range Status   Specimen Description URINE, CLEAN CATCH  Final   Special Requests NONE  Final   Culture >=100,000 COLONIES/mL ESCHERICHIA COLI  Final   Report Status 12/27/2015 FINAL  Final   Organism ID, Bacteria ESCHERICHIA COLI  Final      Susceptibility   Escherichia coli - MIC*    AMPICILLIN >=32 RESISTANT Resistant     CEFAZOLIN <=4 SENSITIVE Sensitive     CEFTRIAXONE <=1 SENSITIVE Sensitive     CIPROFLOXACIN >=4 RESISTANT Resistant     GENTAMICIN >=16 RESISTANT Resistant     IMIPENEM <=0.25 SENSITIVE Sensitive     NITROFURANTOIN <=16 SENSITIVE Sensitive     TRIMETH/SULFA <=20 SENSITIVE Sensitive     AMPICILLIN/SULBACTAM 16 INTERMEDIATE Intermediate     PIP/TAZO <=4 SENSITIVE Sensitive     * >=100,000 COLONIES/mL ESCHERICHIA COLI  Blood Culture (routine x 2)     Status: None (Preliminary result)   Collection Time: 12/25/15 10:13 PM  Result Value Ref Range Status   Specimen Description BLOOD LEFT ARM  Final   Special Requests BOTTLES DRAWN AEROBIC AND ANAEROBIC 3ML  Final   Culture NO GROWTH 3 DAYS  Final   Report Status PENDING  Incomplete  Blood Culture (routine x 2)     Status: None (Preliminary result)   Collection Time:  12/25/15 10:32 PM  Result  Value Ref Range Status   Specimen Description BLOOD RIGHT HAND  Final   Special Requests AEROBIC BOTTLE ONLY 10ML  Final   Culture NO GROWTH 3 DAYS  Final   Report Status PENDING  Incomplete  Culture, blood (Routine X 2) w Reflex to ID Panel     Status: None (Preliminary result)   Collection Time: 12/27/15  4:26 PM  Result Value Ref Range Status   Specimen Description BLOOD RIGHT ANTECUBITAL  Final   Special Requests IN PEDIATRIC BOTTLE 3.5CC  Final   Culture NO GROWTH 2 DAYS  Final   Report Status PENDING  Incomplete  Culture, blood (Routine X 2) w Reflex to ID Panel     Status: None (Preliminary result)   Collection Time: 12/27/15  4:32 PM  Result Value Ref Range Status   Specimen Description BLOOD RIGHT HAND  Final   Special Requests IN PEDIATRIC BOTTLE  3.5CC  Final   Culture NO GROWTH 2 DAYS  Final   Report Status PENDING  Incomplete  MRSA PCR Screening     Status: None   Collection Time: 12/27/15  5:16 PM  Result Value Ref Range Status   MRSA by PCR NEGATIVE NEGATIVE Final    Comment:        The GeneXpert MRSA Assay (FDA approved for NASAL specimens only), is one component of a comprehensive MRSA colonization surveillance program. It is not intended to diagnose MRSA infection nor to guide or monitor treatment for MRSA infections.      Impression/Recommendation  Principal Problem:   Sepsis ([place]) Active Problems:   Hypertension   [name] is a 60 y.o. male with  History of urethral stricture, does require several dilations in the past who also had apparent urinary tract infection with orchitis after prior urological procedure now admitted after a month of foul-smelling urine now with symptoms of orchitis and bladder infection with sepsis syndrome. He's been admitted with placement of a Foley catheter and treated with ceftriaxone and Forestine Na on but still with persistent fevers  #1 Persistent fevers:  Check CT of the abdomen and  pelvis with oral and IV  Contrast to wait for renal abscess or other cause for persistent fevers.  Recheck ultrasound of the scrotum.  Hendricks Milo some initial fever on and origin labs seems though that the answer to this question of why 7 fever lies in some complication related to his urinary tract infection and orchitis.  #2 Urinary tract infection with epididymal orchitis due to Escherichia coli: There is no need for carpopedal therapy here we can go back to ceftriaxone the Renne Musca the antibiotics is not the issue here  Dr. Linus Salmons is covering this weekend.   12/29/2015, 3:07 PM   Thank you so much for this interesting consult  [place] for [place] [phone] (pager) [phone] (office) 12/29/2015, 3:07 PM  Rhina Brack[place] 12/29/2015, 3:07 PM

## 2015-12-29 NOTE — Progress Notes (Signed)
[facility] PROGRESS NOTE  BRISON WIST [id number] DOB: 11/20/1955 DOA: 12/25/2015 PCP: Nyoka Cowden, MD Interim summary: [name] is a 60 y.o. male history of hypertension chronic low back pain presents to the ER because of fever and chills. He was found to have sepsis from UTI, and epididymal orchitis. He was started on broad spectrum antibiotics. He also had urinary retention, urology consulted and catheter was placed using cystoscopy.  Assessment/Plan: 1. Sepsis from pyelonephritis and epididymal orchitis: - he waas initially on zosyn , changed to Primaxin earlier on 4/5, blood cultures have been negative so far. Changed to rocpehin on 4/7 by ID.  - urine cultures grew E coli sensitive to rocephin and primaxin and keflex. Plan to narrow the antibiotics to oral when able to keep it down.  - decrease the rate of the fluids and his lactic acid levels improved and his bp improved.     2. Acute respiratory failure of unclear etiology: Repeat CXR ordered on 4/5 and showed small bilateral pleural effusions with slight pulmonary vascular congestion. ABG on 4/5  shows PH OF 7.46, BICARB of 15.8, Pco2 of 22 and po2 of 53.   3. Urinary retention possibly from stricture formation: Urology consulted and he underwent catheter placement using cystoscopy. It should be removed in 10 days, a voiding trail set up in [facility] in 10 days.   4. 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  Improving.  Monitor.   5. Chronic back pain: On pain meds.    6. Hypertension: His bp has been soft since admission and it has improved today.  Monitor off bp meds.   7. Metabolic acidosis / lactic acidosis possibly from sepsis: Bicarb is 19, improving.  Lactic acid improved.  His mentation is good, alert and oriented to place, person and time.   8. Anemia and thrombocytopenia: Blood per rectum? , stool for occult blood ordered.  - hemoglobin on  admission was 14, but it appears his baseline hemoglobin is around 12 and has dropped to 10 to 9.9.  - anemia panel shows low iron levels and low normal b12 levels. , midly elevated LDH,  Normal haptoglobin .   H/o  Chronic thrombocytopenia  , continue to monitor.  Ordered smear for evaluation.  Improving.    9. Elevated bilirubin and AST: with nausea and vomiting today.  US abdomen shows steatosis.  Vomiting improved.    10. Hypokalemia and hypomagnesemia: Replete as needed and repeat in am.   11. Persistent Fevers: Unclear etiology, might need repeat scanning, requested ID consult.    Code Status: full code.  Family Communication: discussed with family at bedside Disposition Plan: plan to transfer to telemetry today.    Consultants:  Urology.   Procedures:  Cystoscopy.  Antibiotics:  primaxin started on 4/5  HPI/Subjective: Reports headache .  Objective: Filed Vitals:   12/29/15 1000 12/29/15 1150  BP: 131/84   Pulse:    Temp:  99 F (37.2 C)  Resp: 23     Intake/Output Summary (Last 24 hours) at 12/29/15 1902 Last data filed at 12/29/15 1848  Gross per 24 hour  Intake   4850 ml  Output   9050 ml  Net  -4200 ml   Filed Weights   12/27/15 1654 12/28/15 0124 12/28/15 0357  Weight: 105.1 kg (231 lb 11.3 oz) 102.7 kg (226 lb 6.6 oz) 105.9 kg (233 lb 7.5 oz)    Exam:   General:  Alert and off oxygen  Cardiovascular: s1s2  Respiratory: ctab  Abdomen: soft non tender non distended bowel sounds heard  Musculoskeletal: trace pedal edema   Data Reviewed: Basic Metabolic Panel:  Recent Labs Lab 12/26/15 2332 12/27/15 0442 12/27/15 1434 12/28/15 0324 12/29/15 0330  NA 139 141 139 138 137  K 3.5 3.9 3.3* 3.3* 3.6  CL 111 113* 111 111 110  CO2 16* 14* 17* 17* 19*  GLUCOSE 89 78 90 105* 82  BUN 17 15 13 11 9   CREATININE 2.09* 1.84* 1.62* 1.32* 1.20  CALCIUM 7.0* 6.9* 7.0* 6.9* 7.4*  MG  --   --  1.2* 2.1  --    Liver Function  Tests:  Recent Labs Lab 12/25/15 1536 12/26/15 0346 12/26/15 2332 12/27/15 1434 12/28/15 0324  AST 32 25 49* 54* 46*  ALT 33 23 38 47 41  ALKPHOS 36* 29* 33* 77 45  BILITOT 0.8 0.7 1.0 1.5* 1.0  PROT 8.4* 6.3* 5.3* 5.7* 5.3*  ALBUMIN 4.4 3.1* 2.4* 2.5* 2.3*   No results for input(s): LIPASE, AMYLASE in the last 168 hours. No results for input(s): AMMONIA in the last 168 hours. CBC:  Recent Labs Lab 12/26/15 0346 12/26/15 2332  12/27/15 1434 12/28/15 0324 12/28/15 1027 12/29/15 0330 12/29/15 1555  WBC 8.6 14.3*  < > 15.2* 16.9* 15.4* 15.9* 14.1*  NEUTROABS 5.8 12.8*  --   --   --   --   --  10.8*  HGB 12.3* 9.7*  < > 9.9* 9.7* 10.0* 10.1* 10.4*  HCT 35.8* 29.4*  < > 29.3* 28.6* 29.8* 29.4* 30.3*  MCV 83.6 83.3  < > 82.3 82.9 81.6 81.0 80.8  PLT 117* 64*  < > 58* 65* 80* 97* 120*  < > = values in this interval not displayed. Cardiac Enzymes: No results for input(s): CKTOTAL, CKMB, CKMBINDEX, TROPONINI in the last 168 hours. BNP (last 3 results) No results for input(s): BNP in the last 8760 hours.  ProBNP (last 3 results) No results for input(s): PROBNP in the last 8760 hours.  CBG:  Recent Labs Lab 12/27/15 1426  GLUCAP 86    Recent Results (from the past 240 hour(s))  Urine culture     Status: None   Collection Time: 12/25/15 10:07 PM  Result Value Ref Range Status   Specimen Description URINE, CLEAN CATCH  Final   Special Requests NONE  Final   Culture >=100,000 COLONIES/mL ESCHERICHIA COLI  Final   Report Status 12/27/2015 FINAL  Final   Organism ID, Bacteria ESCHERICHIA COLI  Final      Susceptibility   Escherichia coli - MIC*    AMPICILLIN >=32 RESISTANT Resistant     CEFAZOLIN <=4 SENSITIVE Sensitive     CEFTRIAXONE <=1 SENSITIVE Sensitive     CIPROFLOXACIN >=4 RESISTANT Resistant     GENTAMICIN >=16 RESISTANT Resistant     IMIPENEM <=0.25 SENSITIVE Sensitive     NITROFURANTOIN <=16 SENSITIVE Sensitive     TRIMETH/SULFA <=20 SENSITIVE  Sensitive     AMPICILLIN/SULBACTAM 16 INTERMEDIATE Intermediate     PIP/TAZO <=4 SENSITIVE Sensitive     * >=100,000 COLONIES/mL ESCHERICHIA COLI  Blood Culture (routine x 2)     Status: None (Preliminary result)   Collection Time: 12/25/15 10:13 PM  Result Value Ref Range Status   Specimen Description BLOOD LEFT ARM  Final   Special Requests BOTTLES DRAWN AEROBIC AND ANAEROBIC 3ML  Final   Culture NO GROWTH 3 DAYS  Final   Report  Status PENDING  Incomplete  Blood Culture (routine x 2)     Status: None (Preliminary result)   Collection Time: 12/25/15 10:32 PM  Result Value Ref Range Status   Specimen Description BLOOD RIGHT HAND  Final   Special Requests AEROBIC BOTTLE ONLY 10ML  Final   Culture NO GROWTH 3 DAYS  Final   Report Status PENDING  Incomplete  Culture, blood (Routine X 2) w Reflex to ID Panel     Status: None (Preliminary result)   Collection Time: 12/27/15  4:26 PM  Result Value Ref Range Status   Specimen Description BLOOD RIGHT ANTECUBITAL  Final   Special Requests IN PEDIATRIC BOTTLE 3.5CC  Final   Culture NO GROWTH 2 DAYS  Final   Report Status PENDING  Incomplete  Culture, blood (Routine X 2) w Reflex to ID Panel     Status: None (Preliminary result)   Collection Time: 12/27/15  4:32 PM  Result Value Ref Range Status   Specimen Description BLOOD RIGHT HAND  Final   Special Requests IN PEDIATRIC BOTTLE  3.5CC  Final   Culture NO GROWTH 2 DAYS  Final   Report Status PENDING  Incomplete  MRSA PCR Screening     Status: None   Collection Time: 12/27/15  5:16 PM  Result Value Ref Range Status   MRSA by PCR NEGATIVE NEGATIVE Final    Comment:        The GeneXpert MRSA Assay (FDA approved for NASAL specimens only), is one component of a comprehensive MRSA colonization surveillance program. It is not intended to diagnose MRSA infection nor to guide or monitor treatment for MRSA infections.      Studies: US Abdomen Complete  12/27/2015  CLINICAL DATA:   Elevated bilirubin and pyelonephritis. EXAM: ABDOMEN ULTRASOUND COMPLETE COMPARISON:  CT of the abdomen and pelvis without contrast on 12/26/2015. FINDINGS: Gallbladder: No gallstones or wall thickening visualized. No sonographic Murphy sign noted by sonographer. Common bile duct: Diameter: Normal caliber of 4 mm. Liver: The liver demonstrates coarse echotexture and markedly increased echogenicity, likely reflecting diffuse steatosis. No overt cirrhotic contour abnormalities or focal lesions are identified. There is no evidence of intrahepatic biliary ductal dilatation. The portal vein is open. IVC: No abnormality visualized. Pancreas: Visualized portion unremarkable. Spleen: Size and appearance within normal limits. Right Kidney: Length: 10.8 cm. Resolved hydronephrosis since the CT scan. No masses. Left Kidney: Length: 11.4 cm. Hydronephrosis has resolved since CT. No masses are identified. Abdominal aorta: No aneurysm visualized. Other findings: No ascites. IMPRESSION: 1. Markedly echogenic liver consistent with steatosis. No overt cirrhotic changes. No hepatic masses identified by ultrasound. 2. No evidence of biliary obstruction. 3. The kidneys have a normal appearance by ultrasound with resolution of bilateral hydronephrosis seen on the CT study yesterday which was presumably related to bladder distention. Electronically Signed   By: Aletta Edouard M.D.   On: 12/27/2015 19:39   US Scrotum  12/29/2015  CLINICAL DATA:  Fever. EXAM: SCROTAL ULTRASOUND DOPPLER ULTRASOUND OF THE TESTICLES TECHNIQUE: Complete ultrasound examination of the testicles, epididymis, and other scrotal structures was performed. Color and spectral Doppler ultrasound were also utilized to evaluate blood flow to the testicles. COMPARISON:  CT 12/26/2015. FINDINGS: Right testicle Measurements: 4.4 x 2.4 x 3.5 cm. No mass or microlithiasis visualized. Left testicle Measurements: 3.7 x 2.4 x 2.9 cm. 4 mm, 9 mm, and 5 mm cysts are noted. The 4  mm cyst is thinly septated. These most likely benign cyst. No microlithiasis. Right epididymis:  5 mm cyst. The right epididymis is prominent and vascular. Left epididymis:  Left epididymis is prominent and vascular. Hydrocele:  Prominent complex right hydrocele. Varicocele:  None visualized. Pulsed Doppler interrogation of both testes demonstrates normal low resistance arterial and venous waveforms bilaterally. Scrotal skin thickening present. IMPRESSION: 1. Prominent right complex hydrocele. Infected hydrocele could present this fashion. 2. Bilateral epididymitis cannot be excluded. No evidence of testicular mass or torsion. Electronically Signed   By: Marcello Moores  Register   On: 12/29/2015 17:02    Scheduled Meds: . cefTRIAXone (ROCEPHIN)  IV  2 g Intravenous Q24H  . sodium chloride  1,000 mL Intravenous Once  . vitamin B-12  500 mcg Oral Daily   Continuous Infusions: . sodium chloride 150 mL/hr at 12/29/15 1550    Principal Problem:   Sepsis ([place]) Active Problems:   Hypertension   Abdominal pain   Epididymitis   Headache   Pyelonephritis   Scrotal pain   Orchitis   FUO (fever of unknown origin)   Elevated bilirubin    Time spent: 40  Minutes.     [facility]  [facility] Pager [phone] If 7PM-7AM, please contact night-coverage at [email or web address], password [facility] 12/29/2015, 7:02 PM  LOS: 4 days

## 2015-12-29 NOTE — Progress Notes (Signed)
Pharmacy Antibiotic Note  [name] is a 60 y.o. male with urosepsis/ochitis and persistent fevers. Pharmacy has been consulted for Primaxin dosing - D#3. Urine Cx growing E.coli (R-amp, cipro, gent, I-unasyn). Tmax/24h 102.9, wbc stable 15.9. Abx for 2 wks per Urology. Renal function improving, CrCl now ~80.  Plan: Increase Primaxin to 500 mg IV q6h Monitor clinical progress, c/s, renal function, abx plan/LOT Narrow to ceftriaxone today?  Height: 5\' 9"  (175.3 cm) Weight: 233 lb 7.5 oz (105.9 kg) IBW/kg (Calculated) : 70.7  Temp (24hrs), Avg:99.7 F (37.6 C), Min:98.6 F (37 C), Max:102.1 F (38.9 C)   Recent Labs Lab 12/26/15 2332 12/27/15 0025 12/27/15 0442 12/27/15 1101 12/27/15 1434 12/27/15 1822 12/27/15 2146 12/28/15 0324 12/28/15 1027 12/29/15 0330  WBC 14.3*  --  15.5*  --  15.2*  --   --  16.9* 15.4* 15.9*  CREATININE 2.09*  --  1.84*  --  1.62*  --   --  1.32*  --  1.20  LATICACIDVEN  --  2.8* 3.0* 3.1*  --  2.5* 1.9  --   --   --     Estimated Creatinine Clearance: 80.5 mL/min (by C-G formula based on Cr of 1.2).    Allergies  Allergen Reactions  . Lisinopril Cough    Antimicrobials this admission: Zosyn  4/4 >> 4/5 Primaxin 4/5 >>   Microbiology results: 4/5 BCx2: ngtd 4/3 BCx2: ngtd 4/3 urine- e coli >100K (R-amp, cipro, gent, I-unasyn) 4/5 MRSA pcr: neg  Elicia Lamp, PharmD, [facility] Clinical Pharmacist Pager [phone] 12/29/2015 10:41 AM

## 2015-12-30 DIAGNOSIS — R51 Headache: Secondary | ICD-10-CM

## 2015-12-30 DIAGNOSIS — A419 Sepsis, unspecified organism: Principal | ICD-10-CM

## 2015-12-30 DIAGNOSIS — B9620 Unspecified Escherichia coli [E. coli] as the cause of diseases classified elsewhere: Secondary | ICD-10-CM

## 2015-12-30 DIAGNOSIS — N390 Urinary tract infection, site not specified: Secondary | ICD-10-CM

## 2015-12-30 LAB — COMPREHENSIVE METABOLIC PANEL
ALBUMIN: 2.7 g/dL — AB (ref 3.5–5.0)
ALT: 32 U/L (ref 17–63)
ANION GAP: 10 (ref 5–15)
AST: 31 U/L (ref 15–41)
Alkaline Phosphatase: 60 U/L (ref 38–126)
BILIRUBIN TOTAL: 0.8 mg/dL (ref 0.3–1.2)
BUN: 10 mg/dL (ref 6–20)
CO2: 22 mmol/L (ref 22–32)
Calcium: 8.4 mg/dL — ABNORMAL LOW (ref 8.9–10.3)
Chloride: 104 mmol/L (ref 101–111)
Creatinine, Ser: 1.19 mg/dL (ref 0.61–1.24)
GFR calc non Af Amer: >60 mL/min (ref >60)
GLUCOSE: 111 mg/dL — AB (ref 65–99)
POTASSIUM: 3.1 mmol/L — AB (ref 3.5–5.1)
SODIUM: 136 mmol/L (ref 135–145)
TOTAL PROTEIN: 6.6 g/dL (ref 6.5–8.1)

## 2015-12-30 LAB — CBC
HEMATOCRIT: 33.3 % — AB (ref 39.0–52.0)
Hemoglobin: 11.5 g/dL — ABNORMAL LOW (ref 13.0–17.0)
MCH: 27.8 pg (ref 26.0–34.0)
MCHC: 34.5 g/dL (ref 30.0–36.0)
MCV: 80.4 fL (ref 78.0–100.0)
Platelets: 131 10*3/uL — ABNORMAL LOW (ref 150–400)
RBC: 4.14 MIL/uL — ABNORMAL LOW (ref 4.22–5.81)
RDW: 14 % (ref 11.5–15.5)
WBC: 8.7 10*3/uL (ref 4.0–10.5)

## 2015-12-30 LAB — SEDIMENTATION RATE: SED RATE: 65 mm/h — AB (ref 0–16)

## 2015-12-30 LAB — C-REACTIVE PROTEIN: CRP: 14.3 mg/dL — AB (ref <1.0)

## 2015-12-30 MED ORDER — HYDROMORPHONE HCL 1 MG/ML IJ SOLN
1.0000 mg | Freq: Once | INTRAMUSCULAR | Status: AC
Start: 2015-12-30 — End: 2015-12-30
  Administered 2015-12-30: 1 mg via INTRAVENOUS
  Filled 2015-12-30: qty 1

## 2015-12-30 NOTE — Progress Notes (Signed)
Pt c/o severe pain. PRN morphine given. Pt hallucinating afterwards. Pt and family requests to not have morphine anymore. Will make next shift staff aware. Temp 101.6. Tylenol given. Will re-check in 1 hour. Dr Wendee Beavers notified. Will continue to monitor pt frequently. Bed remains in lowest position and call bell is within reach.

## 2015-12-30 NOTE — Progress Notes (Signed)
[name] for Infectious Disease   Reason for visit: Follow up on UTI  Interval History: no testicular or abdominal discomfort, complains of headache and feels he is getting warm; asking for pain medication  Physical Exam: Constitutional:  Filed Vitals:   12/30/15 0512 12/30/15 0513  BP: 125/70   Pulse:  64  Temp: 99.6 F (37.6 C)   Resp: 18    patient appears in NAD Respiratory: Normal respiratory effort; CTA B Cardiovascular: RRR  Review of Systems: Constitutional: positive for fevers, chills and malaise or negative for fatigue Gastrointestinal: negative for nausea and diarrhea Genitourinary: negative for frequency, dysuria and penile discharge  Lab Results  Component Value Date   WBC 14.1* 12/29/2015   HGB 10.4* 12/29/2015   HCT 30.3* 12/29/2015   MCV 80.8 12/29/2015   PLT 120* 12/29/2015    Lab Results  Component Value Date   CREATININE 1.20 12/29/2015   BUN 9 12/29/2015   NA 137 12/29/2015   K 3.6 12/29/2015   CL 110 12/29/2015   CO2 19* 12/29/2015    Lab Results  Component Value Date   ALT 41 12/28/2015   AST 46* 12/28/2015   ALKPHOS 45 12/28/2015     Microbiology: Recent Results (from the past 240 hour(s))  Urine culture     Status: None   Collection Time: 12/25/15 10:07 PM  Result Value Ref Range Status   Specimen Description URINE, CLEAN CATCH  Final   Special Requests NONE  Final   Culture >=100,000 COLONIES/mL ESCHERICHIA COLI  Final   Report Status 12/27/2015 FINAL  Final   Organism ID, Bacteria ESCHERICHIA COLI  Final      Susceptibility   Escherichia coli - MIC*    AMPICILLIN >=32 RESISTANT Resistant     CEFAZOLIN <=4 SENSITIVE Sensitive     CEFTRIAXONE <=1 SENSITIVE Sensitive     CIPROFLOXACIN >=4 RESISTANT Resistant     GENTAMICIN >=16 RESISTANT Resistant     IMIPENEM <=0.25 SENSITIVE Sensitive     NITROFURANTOIN <=16 SENSITIVE Sensitive     TRIMETH/SULFA <=20 SENSITIVE Sensitive     AMPICILLIN/SULBACTAM 16 INTERMEDIATE  Intermediate     PIP/TAZO <=4 SENSITIVE Sensitive     * >=100,000 COLONIES/mL ESCHERICHIA COLI  Blood Culture (routine x 2)     Status: None (Preliminary result)   Collection Time: 12/25/15 10:13 PM  Result Value Ref Range Status   Specimen Description BLOOD LEFT ARM  Final   Special Requests BOTTLES DRAWN AEROBIC AND ANAEROBIC 3ML  Final   Culture NO GROWTH 4 DAYS  Final   Report Status PENDING  Incomplete  Blood Culture (routine x 2)     Status: None (Preliminary result)   Collection Time: 12/25/15 10:32 PM  Result Value Ref Range Status   Specimen Description BLOOD RIGHT HAND  Final   Special Requests AEROBIC BOTTLE ONLY 10ML  Final   Culture NO GROWTH 4 DAYS  Final   Report Status PENDING  Incomplete  Culture, blood (Routine X 2) w Reflex to ID Panel     Status: None (Preliminary result)   Collection Time: 12/27/15  4:26 PM  Result Value Ref Range Status   Specimen Description BLOOD RIGHT ANTECUBITAL  Final   Special Requests IN PEDIATRIC BOTTLE 3.5CC  Final   Culture NO GROWTH 3 DAYS  Final   Report Status PENDING  Incomplete  Culture, blood (Routine X 2) w Reflex to ID Panel     Status: None (Preliminary result)   Collection  Time: 12/27/15  4:32 PM  Result Value Ref Range Status   Specimen Description BLOOD RIGHT HAND  Final   Special Requests IN PEDIATRIC BOTTLE  3.5CC  Final   Culture NO GROWTH 3 DAYS  Final   Report Status PENDING  Incomplete  MRSA PCR Screening     Status: None   Collection Time: 12/27/15  5:16 PM  Result Value Ref Range Status   MRSA by PCR NEGATIVE NEGATIVE Final    Comment:        The GeneXpert MRSA Assay (FDA approved for NASAL specimens only), is one component of a comprehensive MRSA colonization surveillance program. It is not intended to diagnose MRSA infection nor to guide or monitor treatment for MRSA infections.     Impression/Plan:  1. UTI - E coli, on appropriate therapy, some bladder wall thickening on CT.  He is on adequate  antibiotics and broadening not indicated. Blood cultures have remained negative.  2. Headache - his main compliant.  Relieved with pain control.

## 2015-12-30 NOTE — Progress Notes (Signed)
[facility] PROGRESS NOTE  [name] [id number] DOB: 07-03-1956 DOA: 12/25/2015 PCP: Nyoka Cowden, MD Interim summary: [name] is a 60 y.o. male history of hypertension chronic low back pain presents to the ER because of fever and chills. He was found to have sepsis from UTI, and epididymal orchitis. He was started on broad spectrum antibiotics. He also had urinary retention, urology consulted and catheter was placed using cystoscopy.    Assessment/Plan: 1. Sepsis from pyelonephritis and epididymal orchitis: - he waas initially on zosyn , changed to Primaxin earlier on 4/5, blood cultures have been negative so far. Changed to rocpehin on 4/7 by ID.  - urine cultures grew E coli sensitive to rocephin and primaxin and keflex. Plan to narrow the antibiotics to oral when fevers subsides  2. Acute respiratory failure of unclear etiology: - resolved patient breathing comfortably on room air.  3. Urinary retention possibly from stricture formation: Urology consulted and he underwent catheter placement using cystoscopy. It should be removed in 10 days, a voiding trail set up in [facility] in 10 days.   4. 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  Improving.  Monitor.   5. Chronic back pain: On pain meds.    6. Hypertension: His bp has been soft since admission and it has improved today.  Monitor off bp meds.   7. Metabolic acidosis / lactic acidosis possibly from sepsis: Bicarb is 19, improving.  Lactic acid improved.  His mentation is good, alert and oriented to place, person and time.   8. Anemia and thrombocytopenia: Blood per rectum? , stool for occult blood ordered.  - hemoglobin on admission was 14, but it appears his baseline hemoglobin is around 12 and has dropped to 10 to 9.9.  - anemia panel shows low iron levels and low normal b12 levels. , midly elevated LDH,  Normal haptoglobin .   H/o  Chronic  thrombocytopenia  , continue to monitor.  Ordered smear for evaluation.  Improving.    9. Elevated bilirubin and AST US abdomen shows steatosis.  Vomiting improved.  - liver enzymes trending down.  10. Hypokalemia and hypomagnesemia: Replete and reassess next am.  11. Persistent Fevers: - continue current antibiotic regimen.   Code Status: full code.  Family Communication: discussed with family at bedside Disposition Plan: continue monitoring.   Consultants:  Urology.   ID  Procedures:  Cystoscopy.  Antibiotics:  primaxin started on 4/5  HPI/Subjective: No new complaints reported to me. Nursing reports the patient has had hallucinations after receiving morphine. He was not confused or hallucinating when talking to me  Objective: Filed Vitals:   12/30/15 1400 12/30/15 1500  BP:    Pulse:    Temp: 99.9 F (37.7 C) 99.9 F (37.7 C)  Resp:      Intake/Output Summary (Last 24 hours) at 12/30/15 1552 Last data filed at 12/30/15 1302  Gross per 24 hour  Intake 3437.5 ml  Output   7000 ml  Net -3562.5 ml   Filed Weights   12/28/15 0124 12/28/15 0357 12/29/15 2322  Weight: 102.7 kg (226 lb 6.6 oz) 105.9 kg (233 lb 7.5 oz) 100.7 kg (222 lb 0.1 oz)    Exam:   General:  Alert and Awake, in no acute distress  Cardiovascular: s1s2, no rubs  Respiratory: ctab, no wheezes  Abdomen: soft non tender non distended bowel sounds heard  Musculoskeletal: trace pedal edema   Data Reviewed: Basic Metabolic Panel:  Recent Labs Lab 12/27/15 0442 12/27/15 1434 12/28/15 0324 12/29/15 0330 12/30/15 1427  NA 141 139 138 137 136  K 3.9 3.3* 3.3* 3.6 3.1*  CL 113* 111 111 110 104  CO2 14* 17* 17* 19* 22  GLUCOSE 78 90 105* 82 111*  BUN 15 13 11 9 10   CREATININE 1.84* 1.62* 1.32* 1.20 1.19  CALCIUM 6.9* 7.0* 6.9* 7.4* 8.4*  MG  --  1.2* 2.1  --   --    Liver Function Tests:  Recent Labs Lab 12/26/15 0346 12/26/15 2332 12/27/15 1434 12/28/15 0324  12/30/15 1427  AST 25 49* 54* 46* 31  ALT 23 38 47 41 32  ALKPHOS 29* 33* 77 45 60  BILITOT 0.7 1.0 1.5* 1.0 0.8  PROT 6.3* 5.3* 5.7* 5.3* 6.6  ALBUMIN 3.1* 2.4* 2.5* 2.3* 2.7*   No results for input(s): LIPASE, AMYLASE in the last 168 hours. No results for input(s): AMMONIA in the last 168 hours. CBC:  Recent Labs Lab 12/26/15 0346 12/26/15 2332  12/28/15 0324 12/28/15 1027 12/29/15 0330 12/29/15 1555 12/30/15 1427  WBC 8.6 14.3*  < > 16.9* 15.4* 15.9* 14.1* 8.7  NEUTROABS 5.8 12.8*  --   --   --   --  10.8*  --   HGB 12.3* 9.7*  < > 9.7* 10.0* 10.1* 10.4* 11.5*  HCT 35.8* 29.4*  < > 28.6* 29.8* 29.4* 30.3* 33.3*  MCV 83.6 83.3  < > 82.9 81.6 81.0 80.8 80.4  PLT 117* 64*  < > 65* 80* 97* 120* 131*  < > = values in this interval not displayed. Cardiac Enzymes: No results for input(s): CKTOTAL, CKMB, CKMBINDEX, TROPONINI in the last 168 hours. BNP (last 3 results) No results for input(s): BNP in the last 8760 hours.  ProBNP (last 3 results) No results for input(s): PROBNP in the last 8760 hours.  CBG:  Recent Labs Lab 12/27/15 1426  GLUCAP 86    Recent Results (from the past 240 hour(s))  Urine culture     Status: None   Collection Time: 12/25/15 10:07 PM  Result Value Ref Range Status   Specimen Description URINE, CLEAN CATCH  Final   Special Requests NONE  Final   Culture >=100,000 COLONIES/mL ESCHERICHIA COLI  Final   Report Status 12/27/2015 FINAL  Final   Organism ID, Bacteria ESCHERICHIA COLI  Final      Susceptibility   Escherichia coli - MIC*    AMPICILLIN >=32 RESISTANT Resistant     CEFAZOLIN <=4 SENSITIVE Sensitive     CEFTRIAXONE <=1 SENSITIVE Sensitive     CIPROFLOXACIN >=4 RESISTANT Resistant     GENTAMICIN >=16 RESISTANT Resistant     IMIPENEM <=0.25 SENSITIVE Sensitive     NITROFURANTOIN <=16 SENSITIVE Sensitive     TRIMETH/SULFA <=20 SENSITIVE Sensitive     AMPICILLIN/SULBACTAM 16 INTERMEDIATE Intermediate     PIP/TAZO <=4 SENSITIVE  Sensitive     * >=100,000 COLONIES/mL ESCHERICHIA COLI  Blood Culture (routine x 2)     Status: None (Preliminary result)   Collection Time: 12/25/15 10:13 PM  Result Value Ref Range Status   Specimen Description BLOOD LEFT ARM  Final   Special Requests BOTTLES DRAWN AEROBIC AND ANAEROBIC 3ML  Final   Culture NO GROWTH 4 DAYS  Final   Report Status PENDING  Incomplete  Blood Culture (routine x 2)     Status: None (Preliminary result)   Collection Time: 12/25/15 10:32 PM  Result Value Ref Range Status  Specimen Description BLOOD RIGHT HAND  Final   Special Requests AEROBIC BOTTLE ONLY 10ML  Final   Culture NO GROWTH 4 DAYS  Final   Report Status PENDING  Incomplete  Culture, blood (Routine X 2) w Reflex to ID Panel     Status: None (Preliminary result)   Collection Time: 12/27/15  4:26 PM  Result Value Ref Range Status   Specimen Description BLOOD RIGHT ANTECUBITAL  Final   Special Requests IN PEDIATRIC BOTTLE 3.5CC  Final   Culture NO GROWTH 3 DAYS  Final   Report Status PENDING  Incomplete  Culture, blood (Routine X 2) w Reflex to ID Panel     Status: None (Preliminary result)   Collection Time: 12/27/15  4:32 PM  Result Value Ref Range Status   Specimen Description BLOOD RIGHT HAND  Final   Special Requests IN PEDIATRIC BOTTLE  3.5CC  Final   Culture NO GROWTH 3 DAYS  Final   Report Status PENDING  Incomplete  MRSA PCR Screening     Status: None   Collection Time: 12/27/15  5:16 PM  Result Value Ref Range Status   MRSA by PCR NEGATIVE NEGATIVE Final    Comment:        The GeneXpert MRSA Assay (FDA approved for NASAL specimens only), is one component of a comprehensive MRSA colonization surveillance program. It is not intended to diagnose MRSA infection nor to guide or monitor treatment for MRSA infections.      Studies: US Scrotum  12/29/2015  CLINICAL DATA:  Fever. EXAM: SCROTAL ULTRASOUND DOPPLER ULTRASOUND OF THE TESTICLES TECHNIQUE: Complete ultrasound  examination of the testicles, epididymis, and other scrotal structures was performed. Color and spectral Doppler ultrasound were also utilized to evaluate blood flow to the testicles. COMPARISON:  CT 12/26/2015. FINDINGS: Right testicle Measurements: 4.4 x 2.4 x 3.5 cm. No mass or microlithiasis visualized. Left testicle Measurements: 3.7 x 2.4 x 2.9 cm. 4 mm, 9 mm, and 5 mm cysts are noted. The 4 mm cyst is thinly septated. These most likely benign cyst. No microlithiasis. Right epididymis: 5 mm cyst. The right epididymis is prominent and vascular. Left epididymis:  Left epididymis is prominent and vascular. Hydrocele:  Prominent complex right hydrocele. Varicocele:  None visualized. Pulsed Doppler interrogation of both testes demonstrates normal low resistance arterial and venous waveforms bilaterally. Scrotal skin thickening present. IMPRESSION: 1. Prominent right complex hydrocele. Infected hydrocele could present this fashion. 2. Bilateral epididymitis cannot be excluded. No evidence of testicular mass or torsion. Electronically Signed   By: Klein   On: 12/29/2015 17:02   Ct Abdomen Pelvis W Contrast  12/29/2015  CLINICAL DATA:  Fever.  Abdominal and back pain for 5 days. EXAM: CT ABDOMEN AND PELVIS WITH CONTRAST TECHNIQUE: Multidetector CT imaging of the abdomen and pelvis was performed using the standard protocol following bolus administration of intravenous contrast. CONTRAST:  100 mL Isovue-300 IV COMPARISON:  CT 3 days prior 12/26/2015. Abdominal ultrasound 12/27/2015 FINDINGS: Lower chest: New small pleural effusions. Adjacent bibasilar opacities consistent with atelectasis. The heart is enlarged. Liver: Diffusely decreased density consistent with steatosis. More focal fatty infiltration adjacent with falciform ligament. No suspicious focal lesion. Hepatobiliary: Gallbladder physiologically distended, no calcified stone. No biliary dilatation. Pancreas: No ductal dilatation or inflammation.  Spleen: Normal. Adrenal glands: No nodule. Kidneys: Symmetric renal enhancement and excretion. There are bilateral parapelvic cysts. No hydronephrosis. No perinephric stranding. Stomach/Bowel: Stomach physiologically distended. Small hiatal hernia. There are no dilated or thickened small bowel loops.  No bowel obstruction, oral contrast throughout the colon. Small volume of stool throughout the colon without colonic wall thickening. Moderate distal colonic diverticulosis without diverticulitis. The appendix is normal. Vascular/Lymphatic: No retroperitoneal adenopathy. Abdominal aorta is normal in caliber. Moderate atherosclerosis without aneurysm. Reproductive: Prostate gland normal in size. Bladder: Foley catheter in place. Decreased bladder distention from prior, however there is marked diffuse urinary bladder wall thickening. Perivesicular soft tissue stranding and free fluid posterior to the pelvis. Other: No free air or intra-abdominal fluid collection. Pelvic free fluid posterior to the bladder. No loculated fluid collection. Tiny fat containing umbilical hernia. Musculoskeletal: There are no acute or suspicious osseous abnormalities. Degenerative change of both hips and sacroiliac joints. IMPRESSION: 1. Diffuse urinary bladder wall thickening with mild perivesicular inflammation and adjacent free fluid. Findings highly suspicious for cystitis/urinary tract infection. No findings of pyelonephritis. 2. Small pleural effusions and compressive atelectasis, new from prior CT. 3. Parapelvic cysts in both kidneys. 4. Chronic incidental findings of hepatic steatosis, small hiatal hernia and diverticulosis. Electronically Signed   By: Jeb Levering M.D.   On: 12/29/2015 21:35    Scheduled Meds: . cefTRIAXone (ROCEPHIN)  IV  2 g Intravenous Q24H  . sodium chloride  1,000 mL Intravenous Once  . vitamin B-12  500 mcg Oral Daily   Continuous Infusions: . sodium chloride 75 mL/hr at 12/30/15 0631    Principal  Problem:   Sepsis ([place]) Active Problems:   Hypertension   Abdominal pain   Epididymitis   Headache   Pyelonephritis   Scrotal pain   Orchitis   FUO (fever of unknown origin)   Elevated bilirubin    Time spent: 30  Minutes.     Velvet Bathe  [facility] Pager [phone] If 7PM-7AM, please contact night-coverage at [email or web address], password [facility] 12/30/2015, 3:52 PM  LOS: 5 days

## 2015-12-31 DIAGNOSIS — A4151 Sepsis due to Escherichia coli [E. coli]: Secondary | ICD-10-CM

## 2015-12-31 LAB — CULTURE, BLOOD (ROUTINE X 2)
Culture: NO GROWTH
Culture: NO GROWTH

## 2015-12-31 LAB — HEPATITIS C ANTIBODY (REFLEX): HCV Ab: <0.1 s/co ratio (ref 0.0–0.9)

## 2015-12-31 LAB — HCV COMMENT:

## 2015-12-31 LAB — BASIC METABOLIC PANEL
ANION GAP: 11 (ref 5–15)
BUN: 9 mg/dL (ref 6–20)
CO2: 20 mmol/L — AB (ref 22–32)
Calcium: 8.4 mg/dL — ABNORMAL LOW (ref 8.9–10.3)
Chloride: 105 mmol/L (ref 101–111)
Creatinine, Ser: 1.05 mg/dL (ref 0.61–1.24)
GFR calc Af Amer: >60 mL/min (ref >60)
GLUCOSE: 123 mg/dL — AB (ref 65–99)
POTASSIUM: 3.4 mmol/L — AB (ref 3.5–5.1)
Sodium: 136 mmol/L (ref 135–145)

## 2015-12-31 MED ORDER — KETOROLAC TROMETHAMINE 15 MG/ML IJ SOLN
15.0000 mg | Freq: Once | INTRAMUSCULAR | Status: AC
  Administered 2015-12-31: 15 mg via INTRAVENOUS
  Filled 2015-12-31: qty 1

## 2015-12-31 MED ORDER — CEFPODOXIME PROXETIL 200 MG PO TABS
200.0000 mg | ORAL_TABLET | Freq: Two times a day (BID) | ORAL | Status: DC
  Administered 2015-12-31 – 2016-01-01 (×3): 200 mg via ORAL
  Filled 2015-12-31 (×3): qty 1

## 2015-12-31 MED ORDER — IBUPROFEN 400 MG PO TABS
400.0000 mg | ORAL_TABLET | Freq: Four times a day (QID) | ORAL | Status: DC | PRN
  Administered 2015-12-31 – 2016-01-01 (×3): 400 mg via ORAL
  Filled 2015-12-31 (×3): qty 1

## 2015-12-31 NOTE — Progress Notes (Signed)
Pt c/o headache unrelieved by tylenol or Norco. Pt and family refusing Morphine. Paged Dr Wendee Beavers. Waiting to hear back from dr.

## 2015-12-31 NOTE — Progress Notes (Signed)
Pt is still complaining of a headache. He states that when he mashes on the left side of his neck that a sharp pain shoots up in his head. Given Hydrocodone. Will continue to monitor.

## 2015-12-31 NOTE — Progress Notes (Signed)
[facility] PROGRESS NOTE  [name] [id number] DOB: [birth year] DOA: 12/25/2015 PCP: Nyoka Cowden, MD Interim summary: [name] is a 60 y.o. male history of hypertension chronic low back pain presents to the ER because of fever and chills. He was found to have sepsis from UTI, and epididymal orchitis. He was started on broad spectrum antibiotics. He also had urinary retention, urology consulted and catheter was placed using cystoscopy.    Assessment/Plan: 1. Sepsis from pyelonephritis and epididymal orchitis: - he waas initially on zosyn , changed to Primaxin earlier on 4/5, blood cultures have been negative so far. Changed to rocephin on 4/7 by ID.  - Will narrow antibiotic regimen today given that patient has been afebrile for 24 hours.  2. Acute respiratory failure of unclear etiology: - resolved patient breathing comfortably on room air.  3. Urinary retention possibly from stricture formation: Urology consulted and he underwent catheter placement using cystoscopy. It should be removed in 10 days, a voiding trail set up in [facility] in 10 days.   4. 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  Improving.  Monitor.   5. Chronic back pain: On pain meds.   6. Hypertension: His bp has been soft since admission and it has improved today.  Monitor off bp meds.   7. Metabolic acidosis / lactic acidosis possibly from sepsis: Bicarb is 19, improving.  Lactic acid improved.  His mentation is good, alert and oriented to place, person and time.   8. Anemia and thrombocytopenia: Blood per rectum? , stool for occult blood ordered.  - hemoglobin on admission was 14, but it appears his baseline hemoglobin is around 12 and has dropped to 10 to 9.9.  - anemia panel shows low iron levels and low normal b12 levels. , midly elevated LDH,  Normal haptoglobin .   H/o  Chronic thrombocytopenia  , continue to monitor.  Ordered smear  for evaluation.  Improving.    9. Elevated bilirubin and AST US abdomen shows steatosis.  Vomiting improved.  - liver enzymes trending down.  10. Hypokalemia and hypomagnesemia: Replete and reassess next am.  11. Persistent Fevers: - continue current antibiotic regimen.   Code Status: full code.  Family Communication: discussed with family at bedside Disposition Plan: continue monitoring.   Consultants:  Urology.   ID  Procedures:  Cystoscopy.  Antibiotics:  primaxin started on 4/5  HPI/Subjective: No new complaints reported. No acute issues reported overnight.  Objective: Filed Vitals:   12/31/15 0500 12/31/15 1251  BP: 139/84 134/79  Pulse: 64 62  Temp: 98.7 F (37.1 C) 98.3 F (36.8 C)  Resp: 16 20    Intake/Output Summary (Last 24 hours) at 12/31/15 1540 Last data filed at 12/31/15 1502  Gross per 24 hour  Intake   1420 ml  Output   3200 ml  Net  -1780 ml   Filed Weights   12/28/15 0357 12/29/15 2322 12/31/15 0616  Weight: 105.9 kg (233 lb 7.5 oz) 100.7 kg (222 lb 0.1 oz) 96.48 kg (212 lb 11.2 oz)    Exam:   General:  Alert and Awake, in no acute distress  Cardiovascular: s1s2, no rubs  Respiratory: ctab, no wheezes  Abdomen: soft non tender non distended bowel sounds heard  Musculoskeletal: trace pedal edema   Data Reviewed: Basic Metabolic Panel:  Recent Labs Lab 12/27/15 1434 12/28/15 0324 12/29/15 0330 12/30/15 1427 12/31/15 1106  NA 139 138 137 136 136  K  3.3* 3.3* 3.6 3.1* 3.4*  CL 111 111 110 104 105  CO2 17* 17* 19* 22 20*  GLUCOSE 90 105* 82 111* 123*  BUN 13 11 9 10 9   CREATININE 1.62* 1.32* 1.20 1.19 1.05  CALCIUM 7.0* 6.9* 7.4* 8.4* 8.4*  MG 1.2* 2.1  --   --   --    Liver Function Tests:  Recent Labs Lab 12/26/15 0346 12/26/15 2332 12/27/15 1434 12/28/15 0324 12/30/15 1427  AST 25 49* 54* 46* 31  ALT 23 38 47 41 32  ALKPHOS 29* 33* 77 45 60  BILITOT 0.7 1.0 1.5* 1.0 0.8  PROT 6.3* 5.3* 5.7*  5.3* 6.6  ALBUMIN 3.1* 2.4* 2.5* 2.3* 2.7*   No results for input(s): LIPASE, AMYLASE in the last 168 hours. No results for input(s): AMMONIA in the last 168 hours. CBC:  Recent Labs Lab 12/26/15 0346 12/26/15 2332  12/28/15 0324 12/28/15 1027 12/29/15 0330 12/29/15 1555 12/30/15 1427  WBC 8.6 14.3*  < > 16.9* 15.4* 15.9* 14.1* 8.7  NEUTROABS 5.8 12.8*  --   --   --   --  10.8*  --   HGB 12.3* 9.7*  < > 9.7* 10.0* 10.1* 10.4* 11.5*  HCT 35.8* 29.4*  < > 28.6* 29.8* 29.4* 30.3* 33.3*  MCV 83.6 83.3  < > 82.9 81.6 81.0 80.8 80.4  PLT 117* 64*  < > 65* 80* 97* 120* 131*  < > = values in this interval not displayed. Cardiac Enzymes: No results for input(s): CKTOTAL, CKMB, CKMBINDEX, TROPONINI in the last 168 hours. BNP (last 3 results) No results for input(s): BNP in the last 8760 hours.  ProBNP (last 3 results) No results for input(s): PROBNP in the last 8760 hours.  CBG:  Recent Labs Lab 12/27/15 1426  GLUCAP 86    Recent Results (from the past 240 hour(s))  Urine culture     Status: None   Collection Time: 12/25/15 10:07 PM  Result Value Ref Range Status   Specimen Description URINE, CLEAN CATCH  Final   Special Requests NONE  Final   Culture >=100,000 COLONIES/mL ESCHERICHIA COLI  Final   Report Status 12/27/2015 FINAL  Final   Organism ID, Bacteria ESCHERICHIA COLI  Final      Susceptibility   Escherichia coli - MIC*    AMPICILLIN >=32 RESISTANT Resistant     CEFAZOLIN <=4 SENSITIVE Sensitive     CEFTRIAXONE <=1 SENSITIVE Sensitive     CIPROFLOXACIN >=4 RESISTANT Resistant     GENTAMICIN >=16 RESISTANT Resistant     IMIPENEM <=0.25 SENSITIVE Sensitive     NITROFURANTOIN <=16 SENSITIVE Sensitive     TRIMETH/SULFA <=20 SENSITIVE Sensitive     AMPICILLIN/SULBACTAM 16 INTERMEDIATE Intermediate     PIP/TAZO <=4 SENSITIVE Sensitive     * >=100,000 COLONIES/mL ESCHERICHIA COLI  Blood Culture (routine x 2)     Status: None   Collection Time: 12/25/15 10:13 PM   Result Value Ref Range Status   Specimen Description BLOOD LEFT ARM  Final   Special Requests BOTTLES DRAWN AEROBIC AND ANAEROBIC 3ML  Final   Culture NO GROWTH 5 DAYS  Final   Report Status 12/31/2015 FINAL  Final  Blood Culture (routine x 2)     Status: None   Collection Time: 12/25/15 10:32 PM  Result Value Ref Range Status   Specimen Description BLOOD RIGHT HAND  Final   Special Requests AEROBIC BOTTLE ONLY 10ML  Final   Culture NO GROWTH 5 DAYS  Final   Report Status 12/31/2015 FINAL  Final  Culture, blood (Routine X 2) w Reflex to ID Panel     Status: None (Preliminary result)   Collection Time: 12/27/15  4:26 PM  Result Value Ref Range Status   Specimen Description BLOOD RIGHT ANTECUBITAL  Final   Special Requests IN PEDIATRIC BOTTLE 3.5CC  Final   Culture NO GROWTH 4 DAYS  Final   Report Status PENDING  Incomplete  Culture, blood (Routine X 2) w Reflex to ID Panel     Status: None (Preliminary result)   Collection Time: 12/27/15  4:32 PM  Result Value Ref Range Status   Specimen Description BLOOD RIGHT HAND  Final   Special Requests IN PEDIATRIC BOTTLE  3.5CC  Final   Culture NO GROWTH 4 DAYS  Final   Report Status PENDING  Incomplete  MRSA PCR Screening     Status: None   Collection Time: 12/27/15  5:16 PM  Result Value Ref Range Status   MRSA by PCR NEGATIVE NEGATIVE Final    Comment:        The GeneXpert MRSA Assay (FDA approved for NASAL specimens only), is one component of a comprehensive MRSA colonization surveillance program. It is not intended to diagnose MRSA infection nor to guide or monitor treatment for MRSA infections.      Studies: US Scrotum  12/29/2015  CLINICAL DATA:  Fever. EXAM: SCROTAL ULTRASOUND DOPPLER ULTRASOUND OF THE TESTICLES TECHNIQUE: Complete ultrasound examination of the testicles, epididymis, and other scrotal structures was performed. Color and spectral Doppler ultrasound were also utilized to evaluate blood flow to the testicles.  COMPARISON:  CT 12/26/2015. FINDINGS: Right testicle Measurements: 4.4 x 2.4 x 3.5 cm. No mass or microlithiasis visualized. Left testicle Measurements: 3.7 x 2.4 x 2.9 cm. 4 mm, 9 mm, and 5 mm cysts are noted. The 4 mm cyst is thinly septated. These most likely benign cyst. No microlithiasis. Right epididymis: 5 mm cyst. The right epididymis is prominent and vascular. Left epididymis:  Left epididymis is prominent and vascular. Hydrocele:  Prominent complex right hydrocele. Varicocele:  None visualized. Pulsed Doppler interrogation of both testes demonstrates normal low resistance arterial and venous waveforms bilaterally. Scrotal skin thickening present. IMPRESSION: 1. Prominent right complex hydrocele. Infected hydrocele could present this fashion. 2. Bilateral epididymitis cannot be excluded. No evidence of testicular mass or torsion. Electronically Signed   By: Bingen   On: 12/29/2015 17:02   Ct Abdomen Pelvis W Contrast  12/29/2015  CLINICAL DATA:  Fever.  Abdominal and back pain for 5 days. EXAM: CT ABDOMEN AND PELVIS WITH CONTRAST TECHNIQUE: Multidetector CT imaging of the abdomen and pelvis was performed using the standard protocol following bolus administration of intravenous contrast. CONTRAST:  100 mL Isovue-300 IV COMPARISON:  CT 3 days prior 12/26/2015. Abdominal ultrasound 12/27/2015 FINDINGS: Lower chest: New small pleural effusions. Adjacent bibasilar opacities consistent with atelectasis. The heart is enlarged. Liver: Diffusely decreased density consistent with steatosis. More focal fatty infiltration adjacent with falciform ligament. No suspicious focal lesion. Hepatobiliary: Gallbladder physiologically distended, no calcified stone. No biliary dilatation. Pancreas: No ductal dilatation or inflammation. Spleen: Normal. Adrenal glands: No nodule. Kidneys: Symmetric renal enhancement and excretion. There are bilateral parapelvic cysts. No hydronephrosis. No perinephric stranding.  Stomach/Bowel: Stomach physiologically distended. Small hiatal hernia. There are no dilated or thickened small bowel loops. No bowel obstruction, oral contrast throughout the colon. Small volume of stool throughout the colon without colonic wall thickening. Moderate distal colonic diverticulosis without  diverticulitis. The appendix is normal. Vascular/Lymphatic: No retroperitoneal adenopathy. Abdominal aorta is normal in caliber. Moderate atherosclerosis without aneurysm. Reproductive: Prostate gland normal in size. Bladder: Foley catheter in place. Decreased bladder distention from prior, however there is marked diffuse urinary bladder wall thickening. Perivesicular soft tissue stranding and free fluid posterior to the pelvis. Other: No free air or intra-abdominal fluid collection. Pelvic free fluid posterior to the bladder. No loculated fluid collection. Tiny fat containing umbilical hernia. Musculoskeletal: There are no acute or suspicious osseous abnormalities. Degenerative change of both hips and sacroiliac joints. IMPRESSION: 1. Diffuse urinary bladder wall thickening with mild perivesicular inflammation and adjacent free fluid. Findings highly suspicious for cystitis/urinary tract infection. No findings of pyelonephritis. 2. Small pleural effusions and compressive atelectasis, new from prior CT. 3. Parapelvic cysts in both kidneys. 4. Chronic incidental findings of hepatic steatosis, small hiatal hernia and diverticulosis. Electronically Signed   By: Jeb Levering M.D.   On: 12/29/2015 21:35    Scheduled Meds: . cefpodoxime  200 mg Oral Q12H  . sodium chloride  1,000 mL Intravenous Once  . vitamin B-12  500 mcg Oral Daily   Continuous Infusions: . sodium chloride 75 mL/hr at 12/30/15 2303    Principal Problem:   Sepsis ([place]) Active Problems:   Hypertension   Abdominal pain   Epididymitis   Headache   Pyelonephritis   Scrotal pain   Orchitis   FUO (fever of unknown origin)   Elevated  bilirubin    Time spent: 30  Minutes.     Velvet Bathe  [facility] Pager [phone] If 7PM-7AM, please contact night-coverage at [email or web address], password [facility] 12/31/2015, 3:40 PM  LOS: 6 days

## 2015-12-31 NOTE — Progress Notes (Signed)
[facility]   Reason for visit: Follow up on UTI  Interval History: no testicular or abdominal discomfort, complains of headache  Physical Exam: Constitutional:  Filed Vitals:   12/31/15 0500 12/31/15 1251  BP: 139/84 134/79  Pulse: 64 62  Temp: 98.7 F (37.1 C) 98.3 F (36.8 C)  Resp: 16 20   patient appears in NAD Respiratory: Normal respiratory effort; CTA B Cardiovascular: RRR  Review of Systems: Constitutional: positive for fevers, chills and malaise or negative for fatigue Gastrointestinal: negative for nausea and diarrhea Genitourinary: negative for frequency, dysuria and penile discharge  Lab Results  Component Value Date   WBC 8.7 12/30/2015   HGB 11.5* 12/30/2015   HCT 33.3* 12/30/2015   MCV 80.4 12/30/2015   PLT 131* 12/30/2015    Lab Results  Component Value Date   CREATININE 1.05 12/31/2015   BUN 9 12/31/2015   NA 136 12/31/2015   K 3.4* 12/31/2015   CL 105 12/31/2015   CO2 20* 12/31/2015    Lab Results  Component Value Date   ALT 32 12/30/2015   AST 31 12/30/2015   ALKPHOS 60 12/30/2015     Microbiology: Recent Results (from the past 240 hour(s))  Urine culture     Status: None   Collection Time: 12/25/15 10:07 PM  Result Value Ref Range Status   Specimen Description URINE, CLEAN CATCH  Final   Special Requests NONE  Final   Culture >=100,000 COLONIES/mL ESCHERICHIA COLI  Final   Report Status 12/27/2015 FINAL  Final   Organism ID, Bacteria ESCHERICHIA COLI  Final      Susceptibility   Escherichia coli - MIC*    AMPICILLIN >=32 RESISTANT Resistant     CEFAZOLIN <=4 SENSITIVE Sensitive     CEFTRIAXONE <=1 SENSITIVE Sensitive     CIPROFLOXACIN >=4 RESISTANT Resistant     GENTAMICIN >=16 RESISTANT Resistant     IMIPENEM <=0.25 SENSITIVE Sensitive     NITROFURANTOIN <=16 SENSITIVE Sensitive     TRIMETH/SULFA <=20 SENSITIVE Sensitive     AMPICILLIN/SULBACTAM 16 INTERMEDIATE Intermediate     PIP/TAZO <=4  SENSITIVE Sensitive     * >=100,000 COLONIES/mL ESCHERICHIA COLI  Blood Culture (routine x 2)     Status: None (Preliminary result)   Collection Time: 12/25/15 10:13 PM  Result Value Ref Range Status   Specimen Description BLOOD LEFT ARM  Final   Special Requests BOTTLES DRAWN AEROBIC AND ANAEROBIC 3ML  Final   Culture NO GROWTH 4 DAYS  Final   Report Status PENDING  Incomplete  Blood Culture (routine x 2)     Status: None (Preliminary result)   Collection Time: 12/25/15 10:32 PM  Result Value Ref Range Status   Specimen Description BLOOD RIGHT HAND  Final   Special Requests AEROBIC BOTTLE ONLY 10ML  Final   Culture NO GROWTH 4 DAYS  Final   Report Status PENDING  Incomplete  Culture, blood (Routine X 2) w Reflex to ID Panel     Status: None (Preliminary result)   Collection Time: 12/27/15  4:26 PM  Result Value Ref Range Status   Specimen Description BLOOD RIGHT ANTECUBITAL  Final   Special Requests IN PEDIATRIC BOTTLE 3.5CC  Final   Culture NO GROWTH 3 DAYS  Final   Report Status PENDING  Incomplete  Culture, blood (Routine X 2) w Reflex to ID Panel     Status: None (Preliminary result)   Collection Time: 12/27/15  4:32 PM  Result  Value Ref Range Status   Specimen Description BLOOD RIGHT HAND  Final   Special Requests IN PEDIATRIC BOTTLE  3.5CC  Final   Culture NO GROWTH 3 DAYS  Final   Report Status PENDING  Incomplete  MRSA PCR Screening     Status: None   Collection Time: 12/27/15  5:16 PM  Result Value Ref Range Status   MRSA by PCR NEGATIVE NEGATIVE Final    Comment:        The GeneXpert MRSA Assay (FDA approved for NASAL specimens only), is one component of a comprehensive MRSA colonization surveillance program. It is not intended to diagnose MRSA infection nor to guide or monitor treatment for MRSA infections.     Impression/Plan:  1. UTI - E coli, on appropriate therapy, some bladder wall thickening on CT.  Blood cultures have remained negative. Now afebrile  about 24 hours.  2. Headache - his main compliant.  Not tolerating morphine.   Dr. Megan Salon to follow up tomorrow

## 2016-01-01 LAB — CULTURE, BLOOD (ROUTINE X 2)
CULTURE: NO GROWTH
Culture: NO GROWTH

## 2016-01-01 MED ORDER — METOCLOPRAMIDE HCL 5 MG/ML IJ SOLN
5.0000 mg | Freq: Once | INTRAMUSCULAR | Status: AC
  Administered 2016-01-01: 5 mg via INTRAVENOUS
  Filled 2016-01-01: qty 2

## 2016-01-01 MED ORDER — KETOROLAC TROMETHAMINE 15 MG/ML IJ SOLN
15.0000 mg | Freq: Once | INTRAMUSCULAR | Status: AC
  Administered 2016-01-01: 15 mg via INTRAVENOUS
  Filled 2016-01-01: qty 1

## 2016-01-01 MED ORDER — CEFPODOXIME PROXETIL 200 MG PO TABS: 200.0000 mg | ORAL_TABLET | Freq: Two times a day (BID) | ORAL | Status: DC

## 2016-01-01 MED ORDER — DIPHENHYDRAMINE HCL 25 MG PO CAPS
25.0000 mg | ORAL_CAPSULE | Freq: Once | ORAL | Status: AC
  Administered 2016-01-01: 25 mg via ORAL
  Filled 2016-01-01: qty 1

## 2016-01-01 MED ORDER — IBUPROFEN 600 MG PO TABS: 600.0000 mg | ORAL_TABLET | Freq: Four times a day (QID) | ORAL | Status: DC | PRN

## 2016-01-01 MED ORDER — IBUPROFEN 600 MG PO TABS
600.0000 mg | ORAL_TABLET | Freq: Four times a day (QID) | ORAL | Status: DC | PRN
  Administered 2016-01-01: 600 mg via ORAL
  Filled 2016-01-01: qty 1

## 2016-01-01 NOTE — Discharge Summary (Signed)
Physician Discharge Summary  [name] [id number] DOB: 11/24/1955 DOA: 12/25/2015  PCP: Nyoka Cowden, MD  Admit date: 12/25/2015 Discharge date: 01/01/2016  Time spent: > 35 minutes  Recommendations for Outpatient Follow-up:  1. Pt will be treated with antibiotics for another 5 days on discharge to complete a 12 day treatment regimen for pyelonephritis. 2. Reassess K levels   Discharge Diagnoses:  Principal Problem:   Sepsis ([place]) Active Problems:   Hypertension   Abdominal pain   Epididymitis   Headache   Pyelonephritis   Scrotal pain   Orchitis   FUO (fever of unknown origin)   Elevated bilirubin   Discharge Condition: stable  Diet recommendation: heart healthy  Filed Weights   12/29/15 2322 12/31/15 0616 01/01/16 0514  Weight: 100.7 kg (222 lb 0.1 oz) 96.48 kg (212 lb 11.2 oz) 97.2 kg (214 lb 4.6 oz)    History of present illness:  From original HPI: [name] is a 60 y.o. male history of hypertension chronic low back pain presents to the ER because of fever and chills. He was found to have sepsis from UTI, and epididymal orchitis. He was started on broad spectrum antibiotics. He also had urinary retention, urology consulted and catheter was placed using cystoscopy.   Hospital Course:  1. Sepsis from pyelonephritis and epididymal orchitis: - he waas initially on zosyn , changed to Primaxin earlier on 4/5, blood cultures have been negative so far. Changed to rocephin on 4/7 by ID.  - Will narrow antibiotic regimen today given that patient has been afebrile for 24 hours. Narrowed to vantin which will treat patient for 5 more days to complete a 12 day treatment regimen.   2. Acute respiratory failure of unclear etiology: - resolved patient breathing comfortably on room air.  3. Urinary retention possibly from stricture formation: Urology consulted and he underwent catheter placement using cystoscopy. It should be removed in 10 days, a voiding trail  set up in [facility] in 10 days.   4. 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  Improved after IVF rehydration, improved oral intake, and amelioration of infectious etiology   5. Chronic back pain: On pain meds.   6. Hypertension: Will continue amlodipine on d/c  7. Metabolic acidosis / lactic acidosis possibly from sepsis: Lactic acid improved.  His mentation is good, alert and oriented to place, person and time.   8. Anemia and thrombocytopenia: - recommend further monitoring as outpatient. - anemia panel shows low iron levels and low normal b12 levels. , midly elevated LDH, Normal haptoglobin .   H/o Chronic thrombocytopenia  , continue to monitor.  Ordered smear for evaluation.  Improving.    9. Elevated bilirubin and AST US abdomen shows steatosis.  Vomiting improved.  - liver enzymes wnl on 4/08  10. Hypokalemia and hypomagnesemia: Improved on last check after replacing. Recommended further monitoring as outpatient.   Procedures:  Please see above  Consultations:  Urology  ID  Discharge Exam: Filed Vitals:   12/31/15 2041 01/01/16 0514  BP: 128/81 136/82  Pulse: 69 54  Temp: 98.7 F (37.1 C) 98.1 F (36.7 C)  Resp: 17 18    General: Pt in nad, alert and awake Cardiovascular: rrr, no mrg Respiratory: cta bl, no wheezes  Discharge Instructions   Discharge Instructions    Call MD for:  severe uncontrolled pain    Complete by:  As directed      Call MD for:  temperature >100.4    Complete by:  As directed      Diet - low sodium heart healthy    Complete by:  As directed      Discharge instructions    Complete by:  As directed   Please follow up with your urologist for voiding trial after discharge.     Increase activity slowly    Complete by:  As directed           Current Discharge Medication List    START taking these medications   Details  cefpodoxime (VANTIN) 200 MG tablet Take 1  tablet (200 mg total) by mouth every 12 (twelve) hours. Qty: 10 tablet, Refills: 0    ibuprofen (ADVIL,MOTRIN) 600 MG tablet Take 1 tablet (600 mg total) by mouth every 6 (six) hours as needed for headache. Qty: 30 tablet, Refills: 0      CONTINUE these medications which have NOT CHANGED   Details  amLODipine (NORVASC) 5 MG tablet Take 1 tablet (5 mg total) by mouth daily. Qty: 90 tablet, Refills: 2    HYDROcodone-acetaminophen (NORCO) 10-325 MG tablet Take 1 tablet by mouth every 6 (six) hours as needed. Qty: 60 tablet, Refills: 0    Multiple Vitamins-Minerals (MENS 50+ MULTI VITAMIN/MIN PO) Take 1 tablet by mouth daily.      STOP taking these medications     losartan (COZAAR) 100 MG tablet      cyclobenzaprine (FLEXERIL) 10 MG tablet        Allergies  Allergen Reactions  . Lisinopril Cough   Follow-up Information    Follow up with Ardis Hughs, MD. Schedule an appointment as soon as possible for a visit in 10 days.   Specialty:  Urology   Why:  for voiding trial   Contact information:   [place] [address] [phone]        The results of significant diagnostics from this hospitalization (including imaging, microbiology, ancillary and laboratory) are listed below for reference.    Significant Diagnostic Studies: Ct Abdomen Pelvis Wo Contrast  12/26/2015  CLINICAL DATA:  Abdominal pain, chills, onset this morning. EXAM: CT ABDOMEN AND PELVIS WITHOUT CONTRAST TECHNIQUE: Multidetector CT imaging of the abdomen and pelvis was performed following the standard protocol without IV contrast. COMPARISON:  None. FINDINGS: There is marked urinary bladder distention, up above the level of the umbilicus, 24 cm craniocaudal. There is hydronephrosis and hydroureter bilaterally. No urinary calculi. There are unremarkable unenhanced appearances of the liver, bile ducts, spleen, pancreas and adrenals. The abdominal aorta is normal in caliber. There is mild  atherosclerotic calcification. There is no adenopathy in the abdomen or pelvis. There is a small hiatal hernia. The stomach and small bowel are otherwise unremarkable. The appendix is normal. There is moderate colonic diverticulosis. There is no evidence of acute diverticulitis or other acute inflammatory process. There is no significant abnormality in the lower chest. There is no significant skeletal lesion. IMPRESSION: 1. Marked urinary bladder distention, up above the level of the umbilicus. Hydronephrosis bilaterally is probably secondary to the bladder distention. 2. Small hiatal hernia. 3. Diverticulosis. Electronically Signed   By: Andreas Newport M.D.   On: 12/26/2015 02:34   Dg Chest 2 View  12/27/2015  CLINICAL DATA:  Fever.  Nausea and vomiting. EXAM: CHEST  2 VIEW COMPARISON:  12/26/2015 and 05/04/2010 FINDINGS: There new small bilateral pleural effusions. Slight distention of the azygos vein. Heart size is normal. No lung consolidation. The patient  has congenital small Bochdalek's hernias, more prominent on the left than the right. IMPRESSION: New small bilateral pleural effusions with slight pulmonary vascular congestion. Electronically Signed   By: Lorriane Shire M.D.   On: 12/27/2015 12:48   Ct Head Wo Contrast  12/27/2015  CLINICAL DATA:  Headache beginning this morning. Nausea and vomiting. EXAM: CT HEAD WITHOUT CONTRAST TECHNIQUE: Contiguous axial images were obtained from the base of the skull through the vertex without intravenous contrast. COMPARISON:  05/07/2010 FINDINGS: The ventricles are normal in size and configuration. There are no parenchymal masses or mass effect, no evidence of an infarct, no extra-axial masses or abnormal fluid collections and no intracranial hemorrhage. The visualized sinuses and mastoid air cells are clear. No skull lesion. IMPRESSION: Normal unenhanced CT scan of the brain. Electronically Signed   By: Lajean Manes M.D.   On: 12/27/2015 13:52   US Abdomen  Complete  12/27/2015  CLINICAL DATA:  Elevated bilirubin and pyelonephritis. EXAM: ABDOMEN ULTRASOUND COMPLETE COMPARISON:  CT of the abdomen and pelvis without contrast on 12/26/2015. FINDINGS: Gallbladder: No gallstones or wall thickening visualized. No sonographic Murphy sign noted by sonographer. Common bile duct: Diameter: Normal caliber of 4 mm. Liver: The liver demonstrates coarse echotexture and markedly increased echogenicity, likely reflecting diffuse steatosis. No overt cirrhotic contour abnormalities or focal lesions are identified. There is no evidence of intrahepatic biliary ductal dilatation. The portal vein is open. IVC: No abnormality visualized. Pancreas: Visualized portion unremarkable. Spleen: Size and appearance within normal limits. Right Kidney: Length: 10.8 cm. Resolved hydronephrosis since the CT scan. No masses. Left Kidney: Length: 11.4 cm. Hydronephrosis has resolved since CT. No masses are identified. Abdominal aorta: No aneurysm visualized. Other findings: No ascites. IMPRESSION: 1. Markedly echogenic liver consistent with steatosis. No overt cirrhotic changes. No hepatic masses identified by ultrasound. 2. No evidence of biliary obstruction. 3. The kidneys have a normal appearance by ultrasound with resolution of bilateral hydronephrosis seen on the CT study yesterday which was presumably related to bladder distention. Electronically Signed   By: Aletta Edouard M.D.   On: 12/27/2015 19:39   US Scrotum  12/29/2015  CLINICAL DATA:  Fever. EXAM: SCROTAL ULTRASOUND DOPPLER ULTRASOUND OF THE TESTICLES TECHNIQUE: Complete ultrasound examination of the testicles, epididymis, and other scrotal structures was performed. Color and spectral Doppler ultrasound were also utilized to evaluate blood flow to the testicles. COMPARISON:  CT 12/26/2015. FINDINGS: Right testicle Measurements: 4.4 x 2.4 x 3.5 cm. No mass or microlithiasis visualized. Left testicle Measurements: 3.7 x 2.4 x 2.9 cm. 4 mm,  9 mm, and 5 mm cysts are noted. The 4 mm cyst is thinly septated. These most likely benign cyst. No microlithiasis. Right epididymis: 5 mm cyst. The right epididymis is prominent and vascular. Left epididymis:  Left epididymis is prominent and vascular. Hydrocele:  Prominent complex right hydrocele. Varicocele:  None visualized. Pulsed Doppler interrogation of both testes demonstrates normal low resistance arterial and venous waveforms bilaterally. Scrotal skin thickening present. IMPRESSION: 1. Prominent right complex hydrocele. Infected hydrocele could present this fashion. 2. Bilateral epididymitis cannot be excluded. No evidence of testicular mass or torsion. Electronically Signed   By: Marcello Moores  Register   On: 12/29/2015 17:02   US Scrotum  12/26/2015  CLINICAL DATA:  Left testicular pain for 1 day EXAM: SCROTAL ULTRASOUND DOPPLER ULTRASOUND OF THE TESTICLES TECHNIQUE: Complete ultrasound examination of the testicles, epididymis, and other scrotal structures was performed. Color and spectral Doppler ultrasound were also utilized to evaluate blood flow to  the testicles. COMPARISON:  None. FINDINGS: Right testicle Measurements: 4.3 x 2.2 x 2.4 cm. No mass or microlithiasis visualized. Left testicle Measurements: 3.5 x 2.2 x 2.8 cm. No solid masses. There are at least 4 subcentimeter cysts, the largest measuring 6 x 7 x 7 mm. Right epididymis:  Mildly enlarged and hyperemic.  No focal lesion. Left epididymis:  Mildly enlarged without focal lesion. Hydrocele:  None visualized. Varicocele:  None visualized. Pulsed Doppler interrogation of both testes demonstrates normal low resistance arterial and venous waveforms bilaterally. IMPRESSION: There are several cysts in the left testis superiorly. No solid testicular masses. Both testes are perfused. Both epididymides mildly enlarged without focal lesion. Electronically Signed   By: Andreas Newport M.D.   On: 12/26/2015 02:43   Ct Abdomen Pelvis W  Contrast  12/29/2015  CLINICAL DATA:  Fever.  Abdominal and back pain for 5 days. EXAM: CT ABDOMEN AND PELVIS WITH CONTRAST TECHNIQUE: Multidetector CT imaging of the abdomen and pelvis was performed using the standard protocol following bolus administration of intravenous contrast. CONTRAST:  100 mL Isovue-300 IV COMPARISON:  CT 3 days prior 12/26/2015. Abdominal ultrasound 12/27/2015 FINDINGS: Lower chest: New small pleural effusions. Adjacent bibasilar opacities consistent with atelectasis. The heart is enlarged. Liver: Diffusely decreased density consistent with steatosis. More focal fatty infiltration adjacent with falciform ligament. No suspicious focal lesion. Hepatobiliary: Gallbladder physiologically distended, no calcified stone. No biliary dilatation. Pancreas: No ductal dilatation or inflammation. Spleen: Normal. Adrenal glands: No nodule. Kidneys: Symmetric renal enhancement and excretion. There are bilateral parapelvic cysts. No hydronephrosis. No perinephric stranding. Stomach/Bowel: Stomach physiologically distended. Small hiatal hernia. There are no dilated or thickened small bowel loops. No bowel obstruction, oral contrast throughout the colon. Small volume of stool throughout the colon without colonic wall thickening. Moderate distal colonic diverticulosis without diverticulitis. The appendix is normal. Vascular/Lymphatic: No retroperitoneal adenopathy. Abdominal aorta is normal in caliber. Moderate atherosclerosis without aneurysm. Reproductive: Prostate gland normal in size. Bladder: Foley catheter in place. Decreased bladder distention from prior, however there is marked diffuse urinary bladder wall thickening. Perivesicular soft tissue stranding and free fluid posterior to the pelvis. Other: No free air or intra-abdominal fluid collection. Pelvic free fluid posterior to the bladder. No loculated fluid collection. Tiny fat containing umbilical hernia. Musculoskeletal: There are no acute or  suspicious osseous abnormalities. Degenerative change of both hips and sacroiliac joints. IMPRESSION: 1. Diffuse urinary bladder wall thickening with mild perivesicular inflammation and adjacent free fluid. Findings highly suspicious for cystitis/urinary tract infection. No findings of pyelonephritis. 2. Small pleural effusions and compressive atelectasis, new from prior CT. 3. Parapelvic cysts in both kidneys. 4. Chronic incidental findings of hepatic steatosis, small hiatal hernia and diverticulosis. Electronically Signed   By: Jeb Levering M.D.   On: 12/29/2015 21:35   [place] Art/ven Flow Abd Pelv Doppler  12/26/2015  CLINICAL DATA:  Left testicular pain for 1 day EXAM: SCROTAL ULTRASOUND DOPPLER ULTRASOUND OF THE TESTICLES TECHNIQUE: Complete ultrasound examination of the testicles, epididymis, and other scrotal structures was performed. Color and spectral Doppler ultrasound were also utilized to evaluate blood flow to the testicles. COMPARISON:  None. FINDINGS: Right testicle Measurements: 4.3 x 2.2 x 2.4 cm. No mass or microlithiasis visualized. Left testicle Measurements: 3.5 x 2.2 x 2.8 cm. No solid masses. There are at least 4 subcentimeter cysts, the largest measuring 6 x 7 x 7 mm. Right epididymis:  Mildly enlarged and hyperemic.  No focal lesion. Left epididymis:  Mildly enlarged without focal lesion. Hydrocele:  None visualized. Varicocele:  None visualized. Pulsed Doppler interrogation of both testes demonstrates normal low resistance arterial and venous waveforms bilaterally. IMPRESSION: There are several cysts in the left testis superiorly. No solid testicular masses. Both testes are perfused. Both epididymides mildly enlarged without focal lesion. Electronically Signed   By: Andreas Newport M.D.   On: 12/26/2015 02:43   Dg Chest Port 1 View  12/26/2015  CLINICAL DATA:  Fever tonight. EXAM: PORTABLE CHEST 1 VIEW COMPARISON:  Included lung bases from CT abdomen/pelvis, available at time of  radiograph interpretation. FINDINGS: Lung volumes are low. Patient is rotated the left. The cardiomediastinal contours are normal for technique. Pulmonary vasculature is normal. No consolidation, pleural effusion, or pneumothorax. No acute osseous abnormalities are seen. IMPRESSION: Hypoventilatory chest without acute process. Electronically Signed   By: Jeb Levering M.D.   On: 12/26/2015 02:12    Microbiology: Recent Results (from the past 240 hour(s))  Urine culture     Status: None   Collection Time: 12/25/15 10:07 PM  Result Value Ref Range Status   Specimen Description URINE, CLEAN CATCH  Final   Special Requests NONE  Final   Culture >=100,000 COLONIES/mL ESCHERICHIA COLI  Final   Report Status 12/27/2015 FINAL  Final   Organism ID, Bacteria ESCHERICHIA COLI  Final      Susceptibility   Escherichia coli - MIC*    AMPICILLIN >=32 RESISTANT Resistant     CEFAZOLIN <=4 SENSITIVE Sensitive     CEFTRIAXONE <=1 SENSITIVE Sensitive     CIPROFLOXACIN >=4 RESISTANT Resistant     GENTAMICIN >=16 RESISTANT Resistant     IMIPENEM <=0.25 SENSITIVE Sensitive     NITROFURANTOIN <=16 SENSITIVE Sensitive     TRIMETH/SULFA <=20 SENSITIVE Sensitive     AMPICILLIN/SULBACTAM 16 INTERMEDIATE Intermediate     PIP/TAZO <=4 SENSITIVE Sensitive     * >=100,000 COLONIES/mL ESCHERICHIA COLI  Blood Culture (routine x 2)     Status: None   Collection Time: 12/25/15 10:13 PM  Result Value Ref Range Status   Specimen Description BLOOD LEFT ARM  Final   Special Requests BOTTLES DRAWN AEROBIC AND ANAEROBIC 3ML  Final   Culture NO GROWTH 5 DAYS  Final   Report Status 12/31/2015 FINAL  Final  Blood Culture (routine x 2)     Status: None   Collection Time: 12/25/15 10:32 PM  Result Value Ref Range Status   Specimen Description BLOOD RIGHT HAND  Final   Special Requests AEROBIC BOTTLE ONLY 10ML  Final   Culture NO GROWTH 5 DAYS  Final   Report Status 12/31/2015 FINAL  Final  Culture, blood (Routine X 2)  w Reflex to ID Panel     Status: None (Preliminary result)   Collection Time: 12/27/15  4:26 PM  Result Value Ref Range Status   Specimen Description BLOOD RIGHT ANTECUBITAL  Final   Special Requests IN PEDIATRIC BOTTLE 3.5CC  Final   Culture NO GROWTH 4 DAYS  Final   Report Status PENDING  Incomplete  Culture, blood (Routine X 2) w Reflex to ID Panel     Status: None (Preliminary result)   Collection Time: 12/27/15  4:32 PM  Result Value Ref Range Status   Specimen Description BLOOD RIGHT HAND  Final   Special Requests IN PEDIATRIC BOTTLE  3.5CC  Final   Culture NO GROWTH 4 DAYS  Final   Report Status PENDING  Incomplete  MRSA PCR Screening     Status: None   Collection Time: 12/27/15  5:16 PM  Result Value Ref Range Status   MRSA by PCR NEGATIVE NEGATIVE Final    Comment:        The GeneXpert MRSA Assay (FDA approved for NASAL specimens only), is one component of a comprehensive MRSA colonization surveillance program. It is not intended to diagnose MRSA infection nor to guide or monitor treatment for MRSA infections.      Labs: Basic Metabolic Panel:  Recent Labs Lab 12/27/15 1434 12/28/15 0324 12/29/15 0330 12/30/15 1427 12/31/15 1106  NA 139 138 137 136 136  K 3.3* 3.3* 3.6 3.1* 3.4*  CL 111 111 110 104 105  CO2 17* 17* 19* 22 20*  GLUCOSE 90 105* 82 111* 123*  BUN 13 11 9 10 9   CREATININE 1.62* 1.32* 1.20 1.19 1.05  CALCIUM 7.0* 6.9* 7.4* 8.4* 8.4*  MG 1.2* 2.1  --   --   --    Liver Function Tests:  Recent Labs Lab 12/26/15 0346 12/26/15 2332 12/27/15 1434 12/28/15 0324 12/30/15 1427  AST 25 49* 54* 46* 31  ALT 23 38 47 41 32  ALKPHOS 29* 33* 77 45 60  BILITOT 0.7 1.0 1.5* 1.0 0.8  PROT 6.3* 5.3* 5.7* 5.3* 6.6  ALBUMIN 3.1* 2.4* 2.5* 2.3* 2.7*   No results for input(s): LIPASE, AMYLASE in the last 168 hours. No results for input(s): AMMONIA in the last 168 hours. CBC:  Recent Labs Lab 12/26/15 0346 12/26/15 2332  12/28/15 0324  12/28/15 1027 12/29/15 0330 12/29/15 1555 12/30/15 1427  WBC 8.6 14.3*  < > 16.9* 15.4* 15.9* 14.1* 8.7  NEUTROABS 5.8 12.8*  --   --   --   --  10.8*  --   HGB 12.3* 9.7*  < > 9.7* 10.0* 10.1* 10.4* 11.5*  HCT 35.8* 29.4*  < > 28.6* 29.8* 29.4* 30.3* 33.3*  MCV 83.6 83.3  < > 82.9 81.6 81.0 80.8 80.4  PLT 117* 64*  < > 65* 80* 97* 120* 131*  < > = values in this interval not displayed. Cardiac Enzymes: No results for input(s): CKTOTAL, CKMB, CKMBINDEX, TROPONINI in the last 168 hours. BNP: BNP (last 3 results) No results for input(s): BNP in the last 8760 hours.  ProBNP (last 3 results) No results for input(s): PROBNP in the last 8760 hours.  CBG:  Recent Labs Lab 12/27/15 1426  GLUCAP 86     Signed:  Velvet Bathe MD.  [facility] 01/01/2016, 11:22 AM

## 2016-01-01 NOTE — Progress Notes (Signed)
Pt/family complainig that nothing is relieving his headache. We even tried the benadryl and reglan mixture. They asked could the Advil be increased as well as the frequency. Dr.Harduk increased the dose to 600 mg. Will continue to monitor

## 2016-01-01 NOTE — Care Management Note (Signed)
Case Management Note  Patient Details  Name: [name] MRN: [id number] Date of Birth: 1956/01/06  Subjective/Objective:                 Patient admitted from home with sepsis.    Action/Plan:  DC to home self care. No CM needs identified.  Expected Discharge Date:                  Expected Discharge Plan:  [place]  In-House Referral:     Discharge planning Services  CM Consult  Post Acute Care Choice:    Choice offered to:     DME Arranged:    DME Agency:     HH Arranged:    [place] Agency:     Status of Service:  Completed, signed off  Medicare Important Message Given:    Date Medicare IM Given:    Medicare IM give by:    Date Additional Medicare IM Given:    Additional Medicare Important Message give by:     If discussed at [place] of Stay Meetings, dates discussed:    Additional Comments:  Carles Collet, RN 01/01/2016, 2:19 PM

## 2016-01-08 ENCOUNTER — Ambulatory Visit (INDEPENDENT_AMBULATORY_CARE_PROVIDER_SITE_OTHER): Payer: BLUE CROSS/BLUE SHIELD | Admitting: Internal Medicine

## 2016-01-08 ENCOUNTER — Encounter: Payer: Self-pay | Admitting: Internal Medicine

## 2016-01-08 VITALS — BP 118/80 | HR 102 | Temp 98.7°F | Resp 20 | Ht 69.0 in | Wt 210.0 lb

## 2016-01-08 DIAGNOSIS — I10 Essential (primary) hypertension: Secondary | ICD-10-CM | POA: Diagnosis not present

## 2016-01-08 DIAGNOSIS — A419 Sepsis, unspecified organism: Secondary | ICD-10-CM

## 2016-01-08 DIAGNOSIS — N35111 Postinfective urethral stricture, not elsewhere classified, male, meatal: Secondary | ICD-10-CM | POA: Diagnosis not present

## 2016-01-08 DIAGNOSIS — N390 Urinary tract infection, site not specified: Secondary | ICD-10-CM | POA: Diagnosis not present

## 2016-01-08 DIAGNOSIS — Z5189 Encounter for other specified aftercare: Secondary | ICD-10-CM

## 2016-01-08 NOTE — Progress Notes (Signed)
Pre visit review using our clinic review tool, if applicable. No additional management support is needed unless otherwise documented below in the visit note. 

## 2016-01-08 NOTE — Progress Notes (Signed)
Subjective:    Patient ID: [name], male    DOB: Dec 14, 1955, 60 y.o.   MRN: [id number]  HPI  Admit date: 12/25/2015 Discharge date: 01/01/2016   Recommendations for Outpatient Follow-up:  1. Pt will be treated with antibiotics for another 5 days on discharge to complete a 12 day treatment regimen for pyelonephritis. 2. Reassess K levels   Discharge Diagnoses:  Principal Problem:  Sepsis ([place]) Active Problems:  Hypertension  Abdominal pain  Epididymitis  Headache  Pyelonephritis  Scrotal pain  Orchitis  FUO (fever of unknown origin)  Elevated bilirubin  60 year old patient who is seen today following a hospital discharge 7 days ago and for TCM;  he was treated for urosepsis secondary to an Escherichia coli UTI.  He was evaluated by urology due to a bladder outlet obstruction and did have a Foley cath removed earlier today.  He states that he has voided once since the catheter was removed earlier. He was scheduled to return to work tomorrow, but feels that he is much too weak.  He presents with  disability forms for completion. He has a history of hypertension and amlodipine has been held post discharge. He remains weak and deconditioned, but no focal complaints.  No fever or chills.    Hospital records reviewed  Past Medical History  Diagnosis Date  . Hypertension   . Sepsis ([place]) hospitalized 12/25/2015  . History of gout      Social History   Social History  . Marital Status: Legally Separated    Spouse Name: N/A  . Number of Children: N/A  . Years of Education: N/A   Occupational History  . Not on file.   Social History Main Topics  . Smoking status: Never Smoker   . Smokeless tobacco: Never Used  . Alcohol Use: 3.6 oz/week    6 Glasses of wine per week  . Drug Use: No  . Sexual Activity: Not Currently   Other Topics Concern  . Not on file   Social History Narrative    Past Surgical History  Procedure Laterality Date  . Inguinal  hernia repair Right     Family History  Problem Relation Age of Onset  . Diabetes Mellitus II Paternal Grandfather     Allergies  Allergen Reactions  . Lisinopril Cough    Current Outpatient Prescriptions on File Prior to Visit  Medication Sig Dispense Refill  . HYDROcodone-acetaminophen (NORCO) 10-325 MG tablet Take 1 tablet by mouth every 6 (six) hours as needed. 60 tablet 0  . ibuprofen (ADVIL,MOTRIN) 600 MG tablet Take 1 tablet (600 mg total) by mouth every 6 (six) hours as needed for headache. 30 tablet 0  . Multiple Vitamins-Minerals (MENS 50+ MULTI VITAMIN/MIN PO) Take 1 tablet by mouth daily.     No current facility-administered medications on file prior to visit.    BP 118/80 mmHg  Pulse 102  Temp(Src) 98.7 F (37.1 C) (Oral)  Resp 20  Ht 5\' 9"  (1.753 m)  Wt 210 lb (95.255 kg)  BMI 31.00 kg/m2  SpO2 98%         Review of Systems  Constitutional: Positive for activity change, appetite change and fatigue. Negative for fever and chills.  HENT: Negative for congestion, dental problem, ear pain, hearing loss, sore throat, tinnitus, trouble swallowing and voice change.   Eyes: Negative for pain, discharge and visual disturbance.  Respiratory: Negative for cough, chest tightness, wheezing and stridor.   Cardiovascular: Negative for chest pain, palpitations and  leg swelling.  Gastrointestinal: Negative for nausea, vomiting, abdominal pain, diarrhea, constipation, blood in stool and abdominal distention.  Genitourinary: Negative for urgency, hematuria, flank pain, discharge, difficulty urinating and genital sores.  Musculoskeletal: Negative for myalgias, back pain, joint swelling, arthralgias, gait problem and neck stiffness.  Skin: Negative for rash.  Neurological: Positive for weakness. Negative for dizziness, syncope, speech difficulty, numbness and headaches.  Hematological: Negative for adenopathy. Does not bruise/bleed easily.  Psychiatric/Behavioral: Negative  for behavioral problems and dysphoric mood. The patient is not nervous/anxious.        Objective:   Physical Exam  Constitutional: He is oriented to person, place, and time. He appears well-developed. No distress.  Blood pressure 110/70 Weak and deconditioned  HENT:  Head: Normocephalic.  Right Ear: External ear normal.  Left Ear: External ear normal.  Eyes: Conjunctivae and EOM are normal.  Neck: Normal range of motion.  Cardiovascular: Normal rate and normal heart sounds.   Pulmonary/Chest: Breath sounds normal.  Abdominal: Bowel sounds are normal.  Musculoskeletal: Normal range of motion. He exhibits no edema or tenderness.  Neurological: He is alert and oriented to person, place, and time.  Psychiatric: He has a normal mood and affect. His behavior is normal.          Assessment & Plan:  Deconditioning secondary to critical illness.  Foley catheter has been discontinued.  For the next week.  He will slowly resume his activity level.  Disability papers completed.  He was given a possible return to work date of April 25 Hypertension.  Continue antihypertensive medications.  Patient will continue home blood pressure monitoring.  Will reassess in one month Status post urosepsis History of bladder outlet obstruction.  Close follow-up urology

## 2016-01-08 NOTE — Patient Instructions (Signed)
Limit your sodium (Salt) intake  Please check your blood pressure on a regular basis.  If it is consistently greater than 150/90, please make an office appointment.    It is important that you exercise regularly, at least 20 minutes 3 to 4 times per week.  If you develop chest pain or shortness of breath seek  medical attention.  Return in one month for follow-up

## 2016-01-23 ENCOUNTER — Encounter (HOSPITAL_COMMUNITY): Payer: Self-pay | Admitting: *Deleted

## 2016-01-23 ENCOUNTER — Other Ambulatory Visit: Payer: Self-pay | Admitting: Urology

## 2016-01-23 NOTE — Progress Notes (Signed)
Dr Landry Dyke ( anesthesia) made aware of CXR from 12/29/2015.  Will repeat CXR on 01/25/2016 per order of Dr Landry Dyke.

## 2016-01-24 ENCOUNTER — Other Ambulatory Visit: Payer: Self-pay | Admitting: Urology

## 2016-01-25 ENCOUNTER — Ambulatory Visit (HOSPITAL_COMMUNITY): Payer: BLUE CROSS/BLUE SHIELD

## 2016-01-25 ENCOUNTER — Encounter (HOSPITAL_COMMUNITY): Payer: Self-pay | Admitting: *Deleted

## 2016-01-25 ENCOUNTER — Ambulatory Visit (HOSPITAL_COMMUNITY): Payer: BLUE CROSS/BLUE SHIELD | Admitting: Anesthesiology

## 2016-01-25 ENCOUNTER — Encounter (HOSPITAL_COMMUNITY)
Admission: RE | Disposition: A | Discharge status: Home / Self Care | Payer: Self-pay | Source: Ambulatory Visit | Attending: Urology

## 2016-01-25 ENCOUNTER — Ambulatory Visit (HOSPITAL_COMMUNITY)
Admission: RE | Admit: 2016-01-25 | Discharge: 2016-01-25 | Disposition: A | Discharge status: Home / Self Care | Payer: BLUE CROSS/BLUE SHIELD | Source: Ambulatory Visit | Attending: Urology | Admitting: Urology

## 2016-01-25 DIAGNOSIS — N35111 Postinfective urethral stricture, not elsewhere classified, male, meatal: Secondary | ICD-10-CM | POA: Diagnosis not present

## 2016-01-25 DIAGNOSIS — N35112 Postinfective bulbous urethral stricture, not elsewhere classified: Secondary | ICD-10-CM

## 2016-01-25 DIAGNOSIS — Z9981 Dependence on supplemental oxygen: Secondary | ICD-10-CM | POA: Diagnosis not present

## 2016-01-25 DIAGNOSIS — Z79891 Long term (current) use of opiate analgesic: Secondary | ICD-10-CM | POA: Insufficient documentation

## 2016-01-25 DIAGNOSIS — N359 Urethral stricture, unspecified: Secondary | ICD-10-CM | POA: Diagnosis not present

## 2016-01-25 DIAGNOSIS — R339 Retention of urine, unspecified: Secondary | ICD-10-CM | POA: Diagnosis not present

## 2016-01-25 DIAGNOSIS — Z79899 Other long term (current) drug therapy: Secondary | ICD-10-CM | POA: Insufficient documentation

## 2016-01-25 DIAGNOSIS — Z01810 Encounter for preprocedural cardiovascular examination: Secondary | ICD-10-CM

## 2016-01-25 DIAGNOSIS — R0602 Shortness of breath: Secondary | ICD-10-CM | POA: Diagnosis not present

## 2016-01-25 DIAGNOSIS — I10 Essential (primary) hypertension: Secondary | ICD-10-CM | POA: Diagnosis not present

## 2016-01-25 DIAGNOSIS — Z01818 Encounter for other preprocedural examination: Secondary | ICD-10-CM | POA: Diagnosis not present

## 2016-01-25 DIAGNOSIS — Z419 Encounter for procedure for purposes other than remedying health state, unspecified: Secondary | ICD-10-CM

## 2016-01-25 HISTORY — PX: CYSTOSCOPY WITH RETROGRADE URETHROGRAM: SHX6309

## 2016-01-25 HISTORY — PX: CYSTOSCOPY WITH DIRECT VISION INTERNAL URETHROTOMY: SHX6637

## 2016-01-25 LAB — BASIC METABOLIC PANEL
Anion gap: 7 (ref 5–15)
BUN: 16 mg/dL (ref 6–20)
CHLORIDE: 108 mmol/L (ref 101–111)
CO2: 25 mmol/L (ref 22–32)
CREATININE: 1.32 mg/dL — AB (ref 0.61–1.24)
Calcium: 8.9 mg/dL (ref 8.9–10.3)
GFR, EST NON AFRICAN AMERICAN: 58 mL/min — AB (ref >60)
Glucose, Bld: 91 mg/dL (ref 65–99)
POTASSIUM: 3.9 mmol/L (ref 3.5–5.1)
SODIUM: 140 mmol/L (ref 135–145)

## 2016-01-25 LAB — CBC
HCT: 36.1 % — ABNORMAL LOW (ref 39.0–52.0)
HEMOGLOBIN: 12 g/dL — AB (ref 13.0–17.0)
MCH: 27.8 pg (ref 26.0–34.0)
MCHC: 33.2 g/dL (ref 30.0–36.0)
MCV: 83.6 fL (ref 78.0–100.0)
PLATELETS: 142 10*3/uL — AB (ref 150–400)
RBC: 4.32 MIL/uL (ref 4.22–5.81)
RDW: 14.6 % (ref 11.5–15.5)
WBC: 3.3 10*3/uL — AB (ref 4.0–10.5)

## 2016-01-25 SURGERY — CYSTOSCOPY, WITH DIRECT VISION INTERNAL URETHROTOMY
Anesthesia: General

## 2016-01-25 MED ORDER — LIDOCAINE HCL (CARDIAC) 20 MG/ML IV SOLN
INTRAVENOUS | Status: DC | PRN
  Administered 2016-01-25: 80 mg via INTRATRACHEAL

## 2016-01-25 MED ORDER — LACTATED RINGERS IV SOLN
INTRAVENOUS | Status: DC
  Administered 2016-01-25: 15:00:00 via INTRAVENOUS
  Administered 2016-01-25: 1000 mL via INTRAVENOUS

## 2016-01-25 MED ORDER — MEPERIDINE HCL 50 MG/ML IJ SOLN: 6.2500 mg | INTRAMUSCULAR | Status: DC | PRN

## 2016-01-25 MED ORDER — PROPOFOL 10 MG/ML IV BOLUS
INTRAVENOUS | Status: AC
  Filled 2016-01-25: qty 20

## 2016-01-25 MED ORDER — SULFAMETHOXAZOLE-TRIMETHOPRIM 800-160 MG PO TABS
1.0000 | ORAL_TABLET | Freq: Two times a day (BID) | ORAL | Status: DC
Start: 2016-01-25 — End: 2016-02-02

## 2016-01-25 MED ORDER — ONDANSETRON HCL 4 MG/2ML IJ SOLN
INTRAMUSCULAR | Status: DC | PRN
  Administered 2016-01-25: 4 mg via INTRAVENOUS

## 2016-01-25 MED ORDER — MIDAZOLAM HCL 5 MG/5ML IJ SOLN
INTRAMUSCULAR | Status: DC | PRN
  Administered 2016-01-25: 2 mg via INTRAVENOUS

## 2016-01-25 MED ORDER — PHENYLEPHRINE 40 MCG/ML (10ML) SYRINGE FOR IV PUSH (FOR BLOOD PRESSURE SUPPORT)
PREFILLED_SYRINGE | INTRAVENOUS | Status: AC
  Filled 2016-01-25: qty 10

## 2016-01-25 MED ORDER — HYDROMORPHONE HCL 1 MG/ML IJ SOLN: 0.2500 mg | INTRAMUSCULAR | Status: DC | PRN

## 2016-01-25 MED ORDER — DEXAMETHASONE SODIUM PHOSPHATE 10 MG/ML IJ SOLN
INTRAMUSCULAR | Status: DC | PRN
  Administered 2016-01-25: 10 mg via INTRAVENOUS

## 2016-01-25 MED ORDER — PROPOFOL 10 MG/ML IV BOLUS
INTRAVENOUS | Status: DC | PRN
  Administered 2016-01-25: 200 mg via INTRAVENOUS

## 2016-01-25 MED ORDER — BELLADONNA ALKALOIDS-OPIUM 16.2-60 MG RE SUPP
RECTAL | Status: DC | PRN
  Administered 2016-01-25: 1 via RECTAL

## 2016-01-25 MED ORDER — DIATRIZOATE MEGLUMINE 30 % UR SOLN
URETHRAL | Status: DC | PRN
  Administered 2016-01-25: 60 mL via URETHRAL

## 2016-01-25 MED ORDER — HYDROCODONE-ACETAMINOPHEN 10-325 MG PO TABS: 1.0000 | ORAL_TABLET | Freq: Four times a day (QID) | ORAL | Status: DC | PRN

## 2016-01-25 MED ORDER — PIPERACILLIN-TAZOBACTAM 3.375 G IVPB
INTRAVENOUS | Status: AC
  Filled 2016-01-25: qty 50

## 2016-01-25 MED ORDER — HYDROCODONE-ACETAMINOPHEN 10-325 MG PO TABS
1.0000 | ORAL_TABLET | Freq: Once | ORAL | Status: DC
  Filled 2016-01-25: qty 1

## 2016-01-25 MED ORDER — FENTANYL CITRATE (PF) 100 MCG/2ML IJ SOLN
INTRAMUSCULAR | Status: DC | PRN
  Administered 2016-01-25 (×2): 50 ug via INTRAVENOUS

## 2016-01-25 MED ORDER — PIPERACILLIN-TAZOBACTAM 3.375 G IVPB 30 MIN
3.3750 g | Freq: Once | INTRAVENOUS | Status: AC
  Administered 2016-01-25: 3.375 g via INTRAVENOUS
  Filled 2016-01-25: qty 50

## 2016-01-25 MED ORDER — IOPAMIDOL (ISOVUE-300) INJECTION 61%
INTRAVENOUS | Status: AC
  Filled 2016-01-25: qty 50

## 2016-01-25 MED ORDER — BELLADONNA ALKALOIDS-OPIUM 16.2-60 MG RE SUPP
RECTAL | Status: AC
  Filled 2016-01-25: qty 1

## 2016-01-25 MED ORDER — MIDAZOLAM HCL 2 MG/2ML IJ SOLN
INTRAMUSCULAR | Status: AC
  Filled 2016-01-25: qty 2

## 2016-01-25 MED ORDER — FENTANYL CITRATE (PF) 100 MCG/2ML IJ SOLN
INTRAMUSCULAR | Status: AC
  Filled 2016-01-25: qty 2

## 2016-01-25 MED ORDER — PHENYLEPHRINE HCL 10 MG/ML IJ SOLN
INTRAMUSCULAR | Status: DC | PRN
  Administered 2016-01-25: 40 ug via INTRAVENOUS
  Administered 2016-01-25: 80 ug via INTRAVENOUS

## 2016-01-25 MED ORDER — SODIUM CHLORIDE 0.9 % IR SOLN
Status: DC | PRN
  Administered 2016-01-25: 3000 mL

## 2016-01-25 SURGICAL SUPPLY — 16 items
BAG URINE DRAINAGE (UROLOGICAL SUPPLIES) ×2 IMPLANT
BALLN NEPHROSTOMY (BALLOONS)
BALLOON NEPHROSTOMY (BALLOONS) IMPLANT
CATH FOLEY 2W COUNCIL 20FR 5CC (CATHETERS) IMPLANT
CATH FOLEY 2W COUNCIL 5CC 18FR (CATHETERS) ×2 IMPLANT
CATH INTERMIT  6FR 70CM (CATHETERS) IMPLANT
CATH ROBINSON RED A/P 12FR (CATHETERS) ×2 IMPLANT
CLOTH BEACON ORANGE TIMEOUT ST (SAFETY) ×2 IMPLANT
GLOVE BIO SURGEON STRL SZ7.5 (GLOVE) ×2 IMPLANT
GOWN STRL REUS W/TWL XL LVL3 (GOWN DISPOSABLE) ×2 IMPLANT
GUIDEWIRE ANG ZIPWIRE 038X150 (WIRE) IMPLANT
GUIDEWIRE STR DUAL SENSOR (WIRE) ×2 IMPLANT
MANIFOLD NEPTUNE II (INSTRUMENTS) ×2 IMPLANT
NS IRRIG 1000ML POUR BTL (IV SOLUTION) ×2 IMPLANT
PACK CYSTO (CUSTOM PROCEDURE TRAY) ×2 IMPLANT
WATER STERILE IRR 3000ML UROMA (IV SOLUTION) ×2 IMPLANT

## 2016-01-25 NOTE — Interval H&P Note (Signed)
History and Physical Interval Note: Patient had his foley removed in clinic and quickly had a recurrence of his voiding symptoms from his stricture.  We will plan to do a RUG and DVIU today.  No changes to his history and physical.  01/25/2016 1:56 PM  [name]  has presented today for surgery, with the diagnosis of URINARY RETENTION, URETHRAL STRICTURE  The various methods of treatment have been discussed with the patient and family. After consideration of risks, benefits and other options for treatment, the patient has consented to  Procedure(s):  DIRECT VISION INTERNAL URETHROTOMY (N/A)  RETROGRADE URETHROGRAM (N/A) as a surgical intervention .  The patient's history has been reviewed, patient examined, no change in status, stable for surgery.  I have reviewed the patient's chart and labs.  Questions were answered to the patient's satisfaction.     Louis Meckel W

## 2016-01-25 NOTE — Discharge Instructions (Signed)
Foley Catheter Care A soft, flexible tube (Foley catheter) may have been placed in your bladder to drain urine and fluid. Follow these instructions: Taking Care of the Catheter  Keep the area where the catheter leaves your body clean.   Attach the catheter to the leg so there is no tension on the catheter.   Keep the drainage bag below the level of the bladder, but keep it OFF the floor.   Do not take long soaking baths. Your caregiver will give instructions about showering.   Wash your hands before touching ANYTHING related to the catheter or bag.   Using mild soap and warm water on a washcloth:   Clean the area closest to the catheter insertion site using a circular motion around the catheter.   Clean the catheter itself by wiping AWAY from the insertion site for several inches down the tube.   NEVER wipe upward as this could sweep bacteria up into the urethra (tube in your body that normally drains the bladder) and cause infection.   Place a small amount of sterile lubricant at the tip of the penis where the catheter is entering.  Taking Care of the Drainage Bags  Two drainage bags may be taken home: a large overnight drainage bag, and a smaller leg bag which fits underneath clothing.   It is okay to wear the overnight bag at any time, but NEVER wear the smaller leg bag at night.   Keep the drainage bag well below the level of your bladder. This prevents backflow of urine into the bladder and allows the urine to drain freely.   Anchor the tubing to your leg to prevent pulling or tension on the catheter. Use tape or a leg strap provided by the hospital.   Empty the drainage bag when it is 1/2 to 3/4 full. Wash your hands before and after touching the bag.   Periodically check the tubing for kinks to make sure there is no pressure on the tubing which could restrict the flow of urine.  Changing the Drainage Bags  Cleanse both ends of the clean bag with alcohol before changing.     Pinch off the rubber catheter to avoid urine spillage during the disconnection.   Disconnect the dirty bag and connect the clean one.   Empty the dirty bag carefully to avoid a urine spill.   Attach the new bag to the leg with tape or a leg strap.  Cleaning the Drainage Bags  Whenever a drainage bag is disconnected, it must be cleaned quickly so it is ready for the next use.   Wash the bag in warm, soapy water.   Rinse the bag thoroughly with warm water.   Soak the bag for 30 minutes in a solution of white vinegar and water (1 cup vinegar to 1 quart warm water).   Rinse with warm water.  SEEK MEDICAL CARE IF:   You have chills or night sweats.   You are leaking around your catheter or have problems with your catheter. It is not uncommon to have sporadic leakage around your catheter as a result of bladder spasms. If the leakage stops, there is not much need for concern. If you are uncertain, call your caregiver.   You develop side effects that you think are coming from your medicines.  SEEK IMMEDIATE MEDICAL CARE IF:   You are suddenly unable to urinate. Check to see if there are any kinks in the drainage tubing that may cause this. If  you cannot find any kinks, call your caregiver immediately. This is an emergency.   You develop shortness of breath or chest pains.   Bleeding persists or clots develop in your urine.   You have a fever.   You develop pain in your back or over your lower belly (abdomen).   You develop pain or swelling in your legs.   Any problems you are having get worse rather than better.  MAKE SURE YOU:   Understand these instructions.   Will watch your condition.  Will get help right away if you are not doing well or get worse.           General Anesthesia, Adult, Care After Refer to this sheet in the next few weeks. These instructions provide you with information on caring for yourself after your procedure. Your health care provider may  also give you more specific instructions. Your treatment has been planned according to current medical practices, but problems sometimes occur. Call your health care provider if you have any problems or questions after your procedure. WHAT TO EXPECT AFTER THE PROCEDURE After the procedure, it is typical to experience:  Sleepiness.  Nausea and vomiting. HOME CARE INSTRUCTIONS  For the first 24 hours after general anesthesia:  Have a responsible person with you.  Do not drive a car. If you are alone, do not take public transportation.  Do not drink alcohol.  Do not take medicine that has not been prescribed by your health care provider.  Do not sign important papers or make important decisions.  You may resume a normal diet and activities as directed by your health care provider.  Change bandages (dressings) as directed.  If you have questions or problems that seem related to general anesthesia, call the hospital and ask for the anesthetist or anesthesiologist on call. SEEK MEDICAL CARE IF:  You have nausea and vomiting that continue the day after anesthesia.  You develop a rash. SEEK IMMEDIATE MEDICAL CARE IF:   You have difficulty breathing.  You have chest pain.  You have any allergic problems.   This information is not intended to replace advice given to you by your health care provider. Make sure you discuss any questions you have with your health care provider.   Document Released: 12/16/2000 Document Revised: 09/30/2014 Document Reviewed: 01/08/2012 Elsevier Interactive Patient Education [employer].

## 2016-01-25 NOTE — H&P (View-Only) (Signed)
I have been asked to see the patient by Dr. Hosie Poisson, MD, for evaluation and management of difficulty foley placement.  History of present illness: 60 year old male with a history of a infectious bulbar urethral stricture and into the emergency department with acute onset lower urinary tract symptoms including frequency, urgency, incomplete bladder emptying, weak stream, dysuria, and suprapubic pain. The patient states that for the past month he is at foul-smelling urine, but no associated symptoms. The patient was also complaining of less testicular/scrotal pain. He has had his urethra dilated 3 or 4 times over the last 20 or 30 years. His most recent dilation was in [place] for 5 years prior. The patient states that He has a weak stream but is able to empty his bladder completely. He does not have a history of recurrent urinary tract infections. In the ER the patient was noted have a bladder scan approximately 900 mL. Several attempts at passing a catheter were made in the ER by emergency room staff. The catheter T was subsequently called in attempted to place a catheter as well. All these attempts failed. I subsequently presented in attempted a coud tip catheter unsuccessfully. I then scoped and a catheter noting several false passages within the bulbar urethra. I was able to find the true lumen of the urethra, albeit a very small and narrow lumen. Once I was able to get a wire across this I then passed an 17 [place] council-tipped catheter. Since the patient has had his Foley catheter placed his urine is pink clear. He has developed fever and progressive oxygenation requirement. His lactate has been slowly rising.  Review of systems: A 12 point comprehensive review of systems was obtained and is negative unless otherwise stated in the history of present illness.  Patient Active Problem List   Diagnosis Date Noted  . Sepsis ([place]) 12/26/2015  . Acute UTI   . Sepsis secondary to UTI ([place])  12/25/2015  . Gout of big toe 05/21/2013  . Back pain 11/03/2012  . Hypertension 11/03/2012    No current facility-administered medications on file prior to encounter.   Current Outpatient Prescriptions on File Prior to Encounter  Medication Sig Dispense Refill  . amLODipine (NORVASC) 5 MG tablet Take 1 tablet (5 mg total) by mouth daily. 90 tablet 2  . HYDROcodone-acetaminophen (NORCO) 10-325 MG tablet Take 1 tablet by mouth every 6 (six) hours as needed. 60 tablet 0  . losartan (COZAAR) 100 MG tablet TAKE 1 TABLET (100 MG TOTAL) BY MOUTH DAILY. 90 tablet 1  . Multiple Vitamins-Minerals (MENS 50+ MULTI VITAMIN/MIN PO) Take 1 tablet by mouth daily.    . cyclobenzaprine (FLEXERIL) 10 MG tablet Take 1 tablet (10 mg total) by mouth 3 (three) times daily as needed for muscle spasms. 30 tablet 0    Past Medical History  Diagnosis Date  . Hypertension   . Sepsis ([place]) hospitalized 12/25/2015  . History of gout     Past Surgical History  Procedure Laterality Date  . Inguinal hernia repair Right     Social History  Substance Use Topics  . Smoking status: Never Smoker   . Smokeless tobacco: Never Used  . Alcohol Use: 3.6 oz/week    6 Glasses of wine per week    Family History  Problem Relation Age of Onset  . Diabetes Mellitus II Paternal Grandfather     PE: Filed Vitals:   12/26/15 1415 12/26/15 1430 12/26/15 1445 12/26/15 1528  BP: 95/68 98/71 96/70  106/65  Pulse:  105 106 103 113  Temp:    99.8 F (37.7 C)  TempSrc:    Oral  Resp: 22 23 19 20   Height:    5\' 9"  (1.753 m)  Weight:    99.791 kg (220 lb)  SpO2: 95% 94% 94% 89%   Patient appears to be in no acute distress  patient is alert and oriented x3 Atraumatic normocephalic head No cervical or supraclavicular lymphadenopathy appreciated Slight increased work of breathing, no audible wheezes/rhonchi Regular sinus rhythm/rate Abdomen is soft, nontender, nondistended, no CVA or suprapubic tenderness Patient has a  tender and edematous left epididymis with scrotal edema Lower extremities are symmetric without appreciable edema Grossly neurologically intact No identifiable skin lesions   Recent Labs  12/25/15 1536 12/26/15 0346  WBC 9.0 8.6  HGB 14.6 12.3*  HCT 42.0 35.8*    Recent Labs  12/25/15 1536 12/26/15 0346  NA 137 137  K 4.0 3.6  CL 104 107  CO2 20* 20*  GLUCOSE 89 99  BUN 16 17  CREATININE 1.41* 1.45*  CALCIUM 9.6 8.0*    Recent Labs  12/26/15 0346  INR 1.27   No results for input(s): LABURIN in the last 72 hours. Results for orders placed or performed during the hospital encounter of 12/25/15  Urine culture     Status: None (Preliminary result)   Collection Time: 12/25/15 10:07 PM  Result Value Ref Range Status   Specimen Description URINE, CLEAN CATCH  Final   Special Requests NONE  Final   Culture >=100,000 COLONIES/mL ESCHERICHIA COLI  Final   Report Status PENDING  Incomplete    Imaging: none  Imp:The patient has a history of a bulbar urethral stricture from 7 to transmitted disease from 20-30 years ago. He has had several dilations over that interval. Over the past month he has had symptoms of cystitis with foul-smelling urine. This progressed over the last several days the patient developed urinary retention with subsequent urosepsis. He also likely has left epididymal orchitis.  Recommendations: The catheter was placed using cystoscopy. The catheter should be removed in 10 days. I have set up a voiding trial in the [facility] office performed in 10 days. The patient should be placed on culture specific antibiotics for 2 weeks. This will cover both his urinary tract infection as well as his epididymal orchitis.  Thank you for involving me in this patient's care, Please page with any further questions or concerns. Louis Meckel W

## 2016-01-25 NOTE — Transfer of Care (Signed)
Immediate Anesthesia Transfer of Care Note  Patient: [name]  Procedure(s) Performed: Procedure(s):  DIRECT VISION INTERNAL URETHROTOMY (N/A)  RETROGRADE URETHROGRAM (N/A)  Patient Location: PACU  Anesthesia Type:General  Level of Consciousness:  sedated, patient cooperative and responds to stimulation  Airway & Oxygen Therapy:Patient Spontanous Breathing and Patient connected to face mask oxgen  Post-op Assessment:  Report given to PACU RN and Post -op Vital signs reviewed and stable  Post vital signs:  Reviewed and stable  Last Vitals:  Filed Vitals:   01/25/16 1025  BP: 150/84  Pulse: 65  Temp: 36.8 C  Resp: 18    Complications: No apparent anesthesia complications

## 2016-01-25 NOTE — Anesthesia Preprocedure Evaluation (Signed)
Anesthesia Evaluation  Patient identified by MRN, date of birth, ID band Patient awake    Reviewed: Allergy & Precautions, NPO status , Patient's Chart, lab work & pertinent test results  Airway Mallampati: II  TM Distance: >3 FB Neck ROM: Full    Dental no notable dental hx.    Pulmonary neg pulmonary ROS,    Pulmonary exam normal breath sounds clear to auscultation       Cardiovascular hypertension, Pt. on medications Normal cardiovascular exam Rhythm:Regular Rate:Normal     Neuro/Psych negative neurological ROS  negative psych ROS   GI/Hepatic negative GI ROS, Neg liver ROS,   Endo/Other  negative endocrine ROS  Renal/GU negative Renal ROS  negative genitourinary   Musculoskeletal negative musculoskeletal ROS (+)   Abdominal   Peds negative pediatric ROS (+)  Hematology negative hematology ROS (+)   Anesthesia Other Findings   Reproductive/Obstetrics                             Anesthesia Physical Anesthesia Plan  ASA: II  Anesthesia Plan: General   Post-op Pain Management:    Induction: Intravenous  Airway Management Planned: LMA  Additional Equipment:   Intra-op Plan:   Post-operative Plan: Extubation in OR  Informed Consent: I have reviewed the patients History and Physical, chart, labs and discussed the procedure including the risks, benefits and alternatives for the proposed anesthesia with the patient or authorized representative who has indicated his/her understanding and acceptance.   Dental advisory given  Plan Discussed with: CRNA  Anesthesia Plan Comments:         Anesthesia Quick Evaluation

## 2016-01-25 NOTE — Op Note (Signed)
Preoperative diagnosis:  1. Postinfectious bulbar urethral stricture   Postoperative diagnosis:  1. Same   Procedure: 1. Retrograde urethrogram with interpretation 2. Direct visual internal urethrotomy  Surgeon: Ardis Hughs, MD  Anesthesia: General  Complications: None  Intraoperative findings:  #1: Through a [place] catheter Isovue was instilled through the catheter into the patient's urethra in the oblique position and fluoroscopic images obtained. The stream and straight at a normal caliber anterior urethra. There was a beak-type narrowing at the urethral bulb that extended approximately 1.5 centimeters to the membranous urethra. #2: The patient's stricture was incised at the 6:00 position and a 18 [place] council tip catheter advanced easily over a wire.  EBL: Minimal  Specimens: None  Indication: [name] is a 61 y.o. patient with history of a urethral stricture who developed urinary tract symptoms and subsequent urosepsis. A catheter was initially placed in the emergency department using cystoscopy. After a voiding trial, the patient's symptoms returned quickly. He presents today for a retrograde urethrogram and DVIU.Marland Kitchen  After reviewing the management options for treatment, he elected to proceed with the above surgical procedure(s). We have discussed the potential benefits and risks of the procedure, side effects of the proposed treatment, the likelihood of the patient achieving the goals of the procedure, and any potential problems that might occur during the procedure or recuperation. Informed consent has been obtained.  Description of procedure:  The patient was taken to the operating room and general anesthesia was induced. In the supine position using a 12 French red rubber catheter retrograde urethrogram was obtained as described above. The patient was then reprepped and draped and placed in dorsal lithotomy position. I then performed a DVIU using the  0 lens and the Collins knife incising the stricture at the 6:00 position. I then advanced the scope into the patient's bladder and performed a quick cystoscopy which demonstrated induration but no discrete or obvious mucosal abnormalities. I then advanced a 0.38 sensor wire into the patient's urethra and into the bladder. I advanced a 59 [place] council-tipped catheter over the wire and into the bladder removing the wire. I did send a urine culture given the foul smell of the patient's urine and the appearance of his bladder.  A B and O suppositories placed in the patient's rectum. He was subsequently extubated and returned to PACU in good condition.   Ardis Hughs, M.D.

## 2016-01-25 NOTE — Anesthesia Postprocedure Evaluation (Signed)
Anesthesia Post Note  Patient: [name]  Procedure(s) Performed: Procedure(s) (LRB):  DIRECT VISION INTERNAL URETHROTOMY (N/A)  RETROGRADE URETHROGRAM (N/A)  Patient location during evaluation: PACU Anesthesia Type: General Level of consciousness: sedated and patient cooperative Pain management: pain level controlled Vital Signs Assessment: post-procedure vital signs reviewed and stable Respiratory status: spontaneous breathing Cardiovascular status: stable Anesthetic complications: no    Last Vitals:  Filed Vitals:   01/25/16 1544 01/25/16 1713  BP: 148/77 146/88  Pulse: 48 58  Temp: 36.5 C 36.9 C  Resp: 14 16    Last Pain: There were no vitals filed for this visit.               Nolon Nations

## 2016-01-25 NOTE — Anesthesia Procedure Notes (Signed)
Procedure Name: LMA Insertion Date/Time: 01/25/2016 2:10 PM Performed by: Dione Booze Pre-anesthesia Checklist: Emergency Drugs available, Suction available, Patient being monitored and Patient identified Patient Re-evaluated:Patient Re-evaluated prior to inductionOxygen Delivery Method: Circle system utilized Preoxygenation: Pre-oxygenation with 100% oxygen Intubation Type: IV induction LMA: LMA inserted LMA Size: 5.0 Number of attempts: 1 Placement Confirmation: breath sounds checked- equal and bilateral and positive ETCO2 Tube secured with: Tape Dental Injury: Teeth and Oropharynx as per pre-operative assessment

## 2016-01-26 ENCOUNTER — Encounter (HOSPITAL_COMMUNITY): Payer: Self-pay | Admitting: Urology

## 2016-01-28 LAB — URINE CULTURE

## 2016-02-01 DIAGNOSIS — N35111 Postinfective urethral stricture, not elsewhere classified, male, meatal: Secondary | ICD-10-CM | POA: Diagnosis not present

## 2016-02-02 ENCOUNTER — Ambulatory Visit (INDEPENDENT_AMBULATORY_CARE_PROVIDER_SITE_OTHER): Payer: BLUE CROSS/BLUE SHIELD | Admitting: Internal Medicine

## 2016-02-02 ENCOUNTER — Encounter: Payer: Self-pay | Admitting: Internal Medicine

## 2016-02-02 VITALS — BP 140/88 | HR 65 | Temp 98.6°F | Resp 20 | Ht 69.0 in | Wt 216.0 lb

## 2016-02-02 DIAGNOSIS — G44039 Episodic paroxysmal hemicrania, not intractable: Secondary | ICD-10-CM

## 2016-02-02 DIAGNOSIS — I10 Essential (primary) hypertension: Secondary | ICD-10-CM | POA: Diagnosis not present

## 2016-02-02 MED ORDER — AMLODIPINE BESYLATE 5 MG PO TABS: 5.0000 mg | ORAL_TABLET | Freq: Every day | ORAL | Status: DC | PRN

## 2016-02-02 NOTE — Progress Notes (Signed)
Pre visit review using our clinic review tool, if applicable. No additional management support is needed unless otherwise documented below in the visit note. 

## 2016-02-02 NOTE — Progress Notes (Signed)
Subjective:    Patient ID: [name], male    DOB: 1956-06-12, 60 y.o.   MRN: [id number]  HPI  60 year old patient who was discharged from the hospital recently following a urological procedure for bladder outlet obstruction.  His Foley cath was discontinued yesterday and he continues to void well.  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 He works in Scientist, research (life sciences) and receiving and his job requires considerable physical exertion with bending, stooping, and heavy lifting and a non-air conditioned setting.  He feels he is too weak to return to work at this time He also complains of some left-sided headaches.  These are relieved by ibuprofen  Past Medical History  Diagnosis Date  . Hypertension   . Sepsis ([place]) hospitalized 12/25/2015  . History of gout      Social History   Social History  . Marital Status: Legally Separated    Spouse Name: N/A  . Number of Children: N/A  . Years of Education: N/A   Occupational History  . Not on file.   Social History Main Topics  . Smoking status: Never Smoker   . Smokeless tobacco: Never Used  . Alcohol Use: 3.6 oz/week    6 Glasses of wine per week  . Drug Use: No  . Sexual Activity: Not Currently   Other Topics Concern  . Not on file   Social History Narrative    Past Surgical History  Procedure Laterality Date  . Inguinal hernia repair Right   . Cystoscopy with direct vision internal urethrotomy N/A 01/25/2016    Procedure:  DIRECT VISION INTERNAL URETHROTOMY;  Surgeon: Ardis Hughs, MD;  Location: [facility];  Service: Urology;  Laterality: N/A;  . Cystoscopy with retrograde urethrogram N/A 01/25/2016    Procedure:  RETROGRADE URETHROGRAM;  Surgeon: Ardis Hughs, MD;  Location: [facility];  Service: Urology;  Laterality: N/A;    Family History  Problem Relation Age of Onset    . Diabetes Mellitus II Paternal Grandfather     Allergies  Allergen Reactions  . Lisinopril Cough    Current Outpatient Prescriptions on File Prior to Visit  Medication Sig Dispense Refill  . HYDROcodone-acetaminophen (NORCO) 10-325 MG tablet Take 1 tablet by mouth every 6 (six) hours as needed. 60 tablet 0  . ibuprofen (ADVIL,MOTRIN) 600 MG tablet Take 1 tablet (600 mg total) by mouth every 6 (six) hours as needed for headache. 30 tablet 0  . losartan (COZAAR) 100 MG tablet Take 100 mg by mouth daily.    . Multiple Vitamins-Minerals (MENS 50+ MULTI VITAMIN/MIN PO) Take 1 tablet by mouth daily.     No current facility-administered medications on file prior to visit.    BP 140/88 mmHg  Pulse 65  Temp(Src) 98.6 F (37 C) (Oral)  Resp 20  Ht 5\' 9"  (1.753 m)  Wt 216 lb (97.977 kg)  BMI 31.88 kg/m2  SpO2 98%     Review of Systems  Constitutional: Positive for fatigue. Negative for fever, chills and appetite change.  HENT: Negative for congestion, dental problem, ear pain, hearing loss, sore throat, tinnitus, trouble swallowing and voice change.   Eyes: Negative for pain, discharge and visual disturbance.  Respiratory: Negative for cough, chest tightness, wheezing and stridor.   Cardiovascular: Negative for chest pain, palpitations and leg swelling.  Gastrointestinal: Negative for nausea, vomiting,  abdominal pain, diarrhea, constipation, blood in stool and abdominal distention.  Genitourinary: Negative for urgency, hematuria, flank pain, discharge, difficulty urinating and genital sores.  Musculoskeletal: Negative for myalgias, back pain, joint swelling, arthralgias, gait problem and neck stiffness.  Skin: Negative for rash.  Neurological: Positive for weakness and headaches. Negative for dizziness, syncope, speech difficulty and numbness.  Hematological: Negative for adenopathy. Does not bruise/bleed easily.  Psychiatric/Behavioral: Negative for behavioral problems and  dysphoric mood. The patient is not nervous/anxious.        Objective:   Physical Exam  Constitutional: He is oriented to person, place, and time. He appears well-developed.  HENT:  Head: Normocephalic.  Right Ear: External ear normal.  Left Ear: External ear normal.  Eyes: Conjunctivae and EOM are normal.  Neck: Normal range of motion.  Cardiovascular: Normal rate and normal heart sounds.   Pulmonary/Chest: Breath sounds normal.  Abdominal: Bowel sounds are normal.  Musculoskeletal: Normal range of motion. He exhibits no edema or tenderness.  Neurological: He is alert and oriented to person, place, and time.  Psychiatric: He has a normal mood and affect. His behavior is normal.          Assessment & Plan:   Status post urological surgery for bladder outlet obstruction.  Continue close urological follow-up Deconditioning.  Will extend his leave of absence for one week Headaches  Encouraged to increase level of activity over the next week

## 2016-02-02 NOTE — Patient Instructions (Signed)
Limit your sodium (Salt) intake  Urological follow-up as scheduled  Please check your blood pressure on a regular basis.  If it is consistently greater than 150/90, please make an office appointment.    It is important that you exercise regularly, at least 20 minutes 3 to 4 times per week.  If you develop chest pain or shortness of breath seek  medical attention.  Return in 6 months for follow-up

## 2016-02-09 ENCOUNTER — Telehealth: Payer: Self-pay | Admitting: Internal Medicine

## 2016-02-09 NOTE — Telephone Encounter (Addendum)
Pt saw dr Burnice Logan on 5-12 and was given medical leave of absence extended to may 22-17. Pt was denied short term disability from [facility] phone [phone] attn chloe ext [phone]  . The letter needs to be more detailed with the reason leave was extended and fax to [phone]. The pt is appeal the denial. Urologist dr Louis Meckel  who did the surgery decline to give the pt and extension. Pt had surgery on 01-25-16. Pt had follow with dr Louis Meckel on 02-01-16 and had  catheter removed.  Pt was unable to return to work on 02-05-16 due weakness, potassium was low, dizziness dehydration and headaches.

## 2016-02-12 NOTE — Telephone Encounter (Signed)
Pt following up on the request for Dr Raliegh Ip to write letter.

## 2016-02-13 NOTE — Telephone Encounter (Signed)
The letter was fax on 02-13-16

## 2016-02-13 NOTE — Telephone Encounter (Signed)
Spoke to pt, told him letter was ready for pickup will be at the front desk. Pt verbalized understanding.

## 2016-03-12 DIAGNOSIS — N358 Other urethral stricture: Secondary | ICD-10-CM | POA: Diagnosis not present

## 2016-03-25 ENCOUNTER — Ambulatory Visit (HOSPITAL_COMMUNITY)
Admission: EM | Admit: 2016-03-25 | Discharge: 2016-03-25 | Disposition: A | Discharge status: Home / Self Care | Payer: BLUE CROSS/BLUE SHIELD | Attending: Family Medicine | Admitting: Family Medicine

## 2016-03-25 ENCOUNTER — Encounter (HOSPITAL_COMMUNITY): Payer: Self-pay | Admitting: Nurse Practitioner

## 2016-03-25 DIAGNOSIS — M1000 Idiopathic gout, unspecified site: Secondary | ICD-10-CM

## 2016-03-25 MED ORDER — COLCHICINE 0.6 MG PO TABS
0.6000 mg | ORAL_TABLET | Freq: Two times a day (BID) | ORAL | Status: DC
Start: 2016-03-25 — End: 2016-09-12

## 2016-03-25 MED ORDER — NAPROXEN 500 MG PO TABS: 500.0000 mg | ORAL_TABLET | Freq: Two times a day (BID) | ORAL | Status: DC

## 2016-03-25 NOTE — ED Notes (Signed)
Pt c/o onset L great toe pain, redness, swelling yesterday. He tried to rub with baking soda and take tylenol with no relief. He has a history of gout and this feels the same.

## 2016-03-25 NOTE — ED Provider Notes (Signed)
CSN: [id number]     Arrival date & time 03/25/16  1302 History   First MD Initiated Contact with Patient 03/25/16 1316     Chief Complaint  Patient presents with  . Toe Pain   (Consider location/radiation/quality/duration/timing/severity/associated sxs/prior Treatment) Patient is a 60 y.o. male presenting with toe pain. The history is provided by the patient.  Toe Pain This is a recurrent problem. The current episode started 2 days ago. The problem has been gradually worsening. The symptoms are aggravated by walking.    Past Medical History  Diagnosis Date  . Hypertension   . Sepsis ([place]) hospitalized 12/25/2015  . History of gout    Past Surgical History  Procedure Laterality Date  . Inguinal hernia repair Right   . Cystoscopy with direct vision internal urethrotomy N/A 01/25/2016    Procedure:  DIRECT VISION INTERNAL URETHROTOMY;  Surgeon: Ardis Hughs, MD;  Location: [facility];  Service: Urology;  Laterality: N/A;  . Cystoscopy with retrograde urethrogram N/A 01/25/2016    Procedure:  RETROGRADE URETHROGRAM;  Surgeon: Ardis Hughs, MD;  Location: [facility];  Service: Urology;  Laterality: N/A;   Family History  Problem Relation Age of Onset  . Diabetes Mellitus II Paternal Grandfather    Social History  Substance Use Topics  . Smoking status: Never Smoker   . Smokeless tobacco: Never Used  . Alcohol Use: 3.6 oz/week    6 Glasses of wine per week    Review of Systems  Constitutional: Negative.   Musculoskeletal: Positive for joint swelling and gait problem.  Skin: Negative.   All other systems reviewed and are negative.   Allergies  Lisinopril  Home Medications   Prior to Admission medications   Medication Sig Start Date End Date Taking? Authorizing Provider  amLODipine (NORVASC) 5 MG tablet Take 1 tablet (5 mg total) by mouth daily as needed. Patient not taking: Reported on 02/02/2016 02/02/16   Marletta Lor, MD  colchicine 0.6 MG tablet Take 1 tablet  (0.6 mg total) by mouth 2 (two) times daily. 03/25/16   Billy Fischer, MD  HYDROcodone-acetaminophen (NORCO) 10-325 MG tablet Take 1 tablet by mouth every 6 (six) hours as needed. 01/25/16   Ardis Hughs, MD  ibuprofen (ADVIL,MOTRIN) 600 MG tablet Take 1 tablet (600 mg total) by mouth every 6 (six) hours as needed for headache. 01/01/16   Velvet Bathe, MD  losartan (COZAAR) 100 MG tablet Take 100 mg by mouth daily.    Historical Provider, MD  Multiple Vitamins-Minerals (MENS 50+ MULTI VITAMIN/MIN PO) Take 1 tablet by mouth daily.    Historical Provider, MD  naproxen (NAPROSYN) 500 MG tablet Take 1 tablet (500 mg total) by mouth 2 (two) times daily. 03/25/16   Billy Fischer, MD   Meds Ordered and Administered this Visit  Medications - No data to display  BP 132/81 mmHg  Pulse 74  Temp(Src) 97.8 F (36.6 C) (Oral)  Resp 17  SpO2 98% No data found.   Physical Exam  Constitutional: He is oriented to person, place, and time. He appears well-developed and well-nourished.  Musculoskeletal: He exhibits tenderness.       Feet:  Neurological: He is alert and oriented to person, place, and time.  Skin: Skin is warm. There is erythema.  Nursing note and vitals reviewed.   ED Course  Procedures (including critical care time)  Labs Review Labs Reviewed - No data to display  Imaging Review No results found.   Visual Acuity  Review  Right Eye Distance:   Left Eye Distance:   Bilateral Distance:    Right Eye Near:   Left Eye Near:    Bilateral Near:         MDM   1. Acute idiopathic gout, unspecified site        Billy Fischer, MD 03/25/16 [phone]

## 2016-05-06 ENCOUNTER — Other Ambulatory Visit: Payer: Self-pay | Admitting: Internal Medicine

## 2016-06-14 ENCOUNTER — Telehealth: Payer: Self-pay | Admitting: Internal Medicine

## 2016-06-14 NOTE — Telephone Encounter (Signed)
Pt needs new hydrocodone °

## 2016-06-17 MED ORDER — HYDROCODONE-ACETAMINOPHEN 10-325 MG PO TABS: 1.0000 | ORAL_TABLET | Freq: Four times a day (QID) | ORAL | 0 refills | Status: DC | PRN

## 2016-06-17 NOTE — Telephone Encounter (Signed)
Spoke to pt, told him Rx ready for pickup and needs to schedule physical for November. Pt verbalized understanding. Rx printed and signed.

## 2016-08-01 DIAGNOSIS — L0231 Cutaneous abscess of buttock: Secondary | ICD-10-CM | POA: Diagnosis not present

## 2016-08-01 DIAGNOSIS — K611 Rectal abscess: Secondary | ICD-10-CM | POA: Diagnosis not present

## 2016-08-01 DIAGNOSIS — M109 Gout, unspecified: Secondary | ICD-10-CM | POA: Diagnosis not present

## 2016-08-11 ENCOUNTER — Other Ambulatory Visit: Payer: Self-pay | Admitting: Internal Medicine

## 2016-08-23 ENCOUNTER — Encounter: Payer: BLUE CROSS/BLUE SHIELD | Admitting: Internal Medicine

## 2016-09-03 ENCOUNTER — Telehealth: Payer: Self-pay | Admitting: Internal Medicine

## 2016-09-03 ENCOUNTER — Encounter: Payer: Self-pay | Admitting: Internal Medicine

## 2016-09-03 ENCOUNTER — Ambulatory Visit (INDEPENDENT_AMBULATORY_CARE_PROVIDER_SITE_OTHER): Payer: BLUE CROSS/BLUE SHIELD | Admitting: Internal Medicine

## 2016-09-03 VITALS — BP 136/82 | HR 64 | Temp 97.8°F | Ht 69.0 in | Wt 219.0 lb

## 2016-09-03 DIAGNOSIS — Z0000 Encounter for general adult medical examination without abnormal findings: Secondary | ICD-10-CM

## 2016-09-03 NOTE — Telephone Encounter (Signed)
Pt aware ok to do fasting labs at 6 mo fup. will call back at later date to schedule when he can check his schedule.

## 2016-09-03 NOTE — Telephone Encounter (Signed)
[name], okay to do labs at 6 month follow up appt but he must fast so please schedule morning appt. Thanks

## 2016-09-03 NOTE — Progress Notes (Signed)
Subjective:    Patient ID: [name], male    DOB: Jan 12, 1956, 60 y.o.   MRN: [id number]  HPI 60 year old patient who is seen today for a preventive health examination. 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  He still does a self cath 2 times per week.  He is voiding well.  He has essential hypertension and a history of gout.  No prior colonoscopy In general doing well. He has been treated recently for a perirectal abscess   Past Medical History:  Diagnosis Date  . History of gout   . Hypertension   . Sepsis [facility]) hospitalized 12/25/2015     Social History   Social History  . Marital status: Legally Separated    Spouse name: N/A  . Number of children: N/A  . Years of education: N/A   Occupational History  . Not on file.   Social History Main Topics  . Smoking status: Never Smoker  . Smokeless tobacco: Never Used  . Alcohol use 3.6 oz/week    6 Glasses of wine per week  . Drug use: No  . Sexual activity: Not Currently   Other Topics Concern  . Not on file   Social History Narrative  . No narrative on file    Past Surgical History:  Procedure Laterality Date  . CYSTOSCOPY WITH DIRECT VISION INTERNAL URETHROTOMY N/A 01/25/2016   Procedure:  DIRECT VISION INTERNAL URETHROTOMY;  Surgeon: Ardis Hughs, MD;  Location: [facility];  Service: Urology;  Laterality: N/A;  . CYSTOSCOPY WITH RETROGRADE URETHROGRAM N/A 01/25/2016   Procedure:  RETROGRADE URETHROGRAM;  Surgeon: Ardis Hughs, MD;  Location: [facility];  Service: Urology;  Laterality: N/A;  . INGUINAL HERNIA REPAIR Right     Family History  Problem Relation Age of Onset  . Diabetes Mellitus II Paternal Grandfather     Allergies  Allergen Reactions  . Lisinopril Cough    Current Outpatient Prescriptions on File Prior to Visit  Medication Sig Dispense Refill  . amLODipine (NORVASC) 5 MG  tablet TAKE 1 TABLET (5 MG TOTAL) BY MOUTH DAILY. 90 tablet 1  . colchicine 0.6 MG tablet Take 1 tablet (0.6 mg total) by mouth 2 (two) times daily. 20 tablet 0  . HYDROcodone-acetaminophen (NORCO) 10-325 MG tablet Take 1 tablet by mouth every 6 (six) hours as needed. 60 tablet 0  . ibuprofen (ADVIL,MOTRIN) 600 MG tablet Take 1 tablet (600 mg total) by mouth every 6 (six) hours as needed for headache. 30 tablet 0  . losartan (COZAAR) 100 MG tablet Take 100 mg by mouth daily.    Marland Kitchen losartan (COZAAR) 100 MG tablet TAKE 1 TABLET (100 MG TOTAL) BY MOUTH DAILY. 90 tablet 1  . Multiple Vitamins-Minerals (MENS 50+ MULTI VITAMIN/MIN PO) Take 1 tablet by mouth daily.    . naproxen (NAPROSYN) 500 MG tablet Take 1 tablet (500 mg total) by mouth 2 (two) times daily. 30 tablet 0   No current facility-administered medications on file prior to visit.     BP 136/82 (BP Location: Right Arm, Patient Position: Sitting, Cuff Size: Normal)   Pulse 64   Temp 97.8 F (36.6 C) (Oral)   Ht 5\' 9"  (1.753 m)   Wt 219 lb (99.3 kg)   SpO2 96%   BMI 32.34 kg/m      Review of Systems  Constitutional: Negative for activity change, appetite change,  chills, fatigue and fever.  HENT: Positive for hearing loss. Negative for congestion, dental problem, ear pain, mouth sores, rhinorrhea, sinus pressure, sneezing, tinnitus, trouble swallowing and voice change.   Eyes: Negative for photophobia, pain, redness and visual disturbance.  Respiratory: Negative for apnea, cough, choking, chest tightness, shortness of breath and wheezing.   Cardiovascular: Negative for chest pain, palpitations and leg swelling.  Gastrointestinal: Negative for abdominal distention, abdominal pain, anal bleeding, blood in stool, constipation, diarrhea, nausea, rectal pain and vomiting.  Endocrine: Polydipsia: he has been treated recently for a perirectal abscess.  Genitourinary: Negative for decreased urine volume, difficulty urinating, discharge,  dysuria, flank pain, frequency, genital sores, hematuria, penile swelling, scrotal swelling, testicular pain and urgency.  Musculoskeletal: Negative for arthralgias, back pain, gait problem, joint swelling, myalgias, neck pain and neck stiffness.  Skin: Negative for color change, rash and wound.  Neurological: Negative for dizziness, tremors, seizures, syncope, facial asymmetry, speech difficulty, weakness, light-headedness, numbness and headaches.  Hematological: Negative for adenopathy. Does not bruise/bleed easily.  Psychiatric/Behavioral: Negative for agitation, behavioral problems, confusion, decreased concentration, dysphoric mood, hallucinations, self-injury, sleep disturbance and suicidal ideas. The patient is not nervous/anxious.        Objective:   Physical Exam  Constitutional: He appears well-developed and well-nourished.  overweight Blood pressure 120/82  HENT:  Head: Normocephalic and atraumatic.  Right Ear: External ear normal.  Left Ear: External ear normal.  Nose: Nose normal.  Mouth/Throat: Oropharynx is clear and moist.  Pharyngeal crowding  Eyes: Conjunctivae and EOM are normal. Pupils are equal, round, and reactive to light. No scleral icterus.  Neck: Normal range of motion. Neck supple. No JVD present. No thyromegaly present.  Cardiovascular: Regular rhythm, normal heart sounds and intact distal pulses.  Exam reveals no gallop and no friction rub.   No murmur heard. Pulmonary/Chest: Effort normal and breath sounds normal. He exhibits no tenderness.  Abdominal: Soft. Bowel sounds are normal. He exhibits no distension and no mass. There is no tenderness.  Surgical scar right lower quadrant  Genitourinary: Prostate normal and penis normal.  Musculoskeletal: Normal range of motion. He exhibits no edema or tenderness.  Lymphadenopathy:    He has no cervical adenopathy.  Neurological: He is alert. He has normal reflexes. No cranial nerve deficit. Coordination normal.    Skin: Skin is warm and dry. No rash noted.  Psychiatric: He has a normal mood and affect. His behavior is normal.          Assessment & Plan:   Preventive health examination.  Will schedule screening colonoscopy.  Check lab including lipid profile History of urethral stricture and urinary retention History of urosepsis History gout.  Stable.  Weight loss encouraged Essential hypertension, stable no change in therapy  Follow-up 6 months  Noela Brothers Pilar Plate

## 2016-09-03 NOTE — Telephone Encounter (Signed)
Pt states he received a call from our office advising him he forgot to do labs today.  Pt states he was told ok to leave. Pt cannot take off work for fasting labs anytime soon, but can come in after 4 pm one day if no need to fast. Please advise

## 2016-09-03 NOTE — Telephone Encounter (Signed)
Okay to defer labs until next visit in 6 months

## 2016-09-03 NOTE — Patient Instructions (Signed)

## 2016-09-03 NOTE — Progress Notes (Signed)
Pre visit review using our clinic review tool, if applicable. No additional management support is needed unless otherwise documented below in the visit note. 

## 2016-09-03 NOTE — Telephone Encounter (Signed)
Please see message and advise 

## 2016-09-04 NOTE — Telephone Encounter (Signed)
Okey, noted.

## 2016-09-12 ENCOUNTER — Other Ambulatory Visit: Payer: Self-pay | Admitting: Internal Medicine

## 2016-09-12 ENCOUNTER — Telehealth: Payer: Self-pay | Admitting: Internal Medicine

## 2016-09-12 MED ORDER — COLCHICINE 0.6 MG PO TABS
0.6000 mg | ORAL_TABLET | Freq: Two times a day (BID) | ORAL | 0 refills | Status: DC
Start: 2016-09-12 — End: 2018-02-04

## 2016-09-12 MED ORDER — COLCHICINE 0.6 MG PO TABS: 0.6000 mg | ORAL_TABLET | Freq: Two times a day (BID) | ORAL | 0 refills | Status: DC

## 2016-09-12 NOTE — Telephone Encounter (Signed)
Rx for colchicine refilled to [facility]  [address] to pt and informed of refill. Pt expressed understanding.

## 2016-09-12 NOTE — Telephone Encounter (Signed)
Patient Name: [name]  DOB: [birth year]    Initial Comment Caller is having pain in his toe.    Nurse Assessment  Nurse: Orvan Seen, RN, [place] Date/Time (Eastern Time): 09/12/2016 12:22:10 PM  Confirm and document reason for call. If symptomatic, describe symptoms. ---Caller states he already spoke with someone in the office and they called in his gout medication. Caller denies any other needs.  Does the patient have any new or worsening symptoms? ---No  Please document clinical information provided and list any resource used. ---declined any needs at this time     Guidelines    Guideline Title Affirmed Question Affirmed Notes       Final Disposition User   Clinical Call Orvan Seen, RN, Jacquilin

## 2016-09-12 NOTE — Telephone Encounter (Signed)
Pt need new Rx for colchicine   Pharm:  [facility]  [address].

## 2016-09-12 NOTE — Telephone Encounter (Signed)
Rx sent in. Pt notified. Nothing further needed.

## 2016-10-08 ENCOUNTER — Telehealth: Payer: Self-pay | Admitting: Internal Medicine

## 2016-10-08 NOTE — Telephone Encounter (Signed)
[facility]  Patient Name: [name]  DOB: 1956-01-19    Initial Comment Caller states he has thrown up and he has a burning feeling in his stomach. He is unsure if he is vomiting blood.    Nurse Assessment  Nurse: Harlow Mares, RN, Suanne Marker Date/Time Eilene Ghazi Time): 10/08/2016 3:12:13 PM  Confirm and document reason for call. If symptomatic, describe symptoms. ---Caller states he has thrown up and he has a burning feeling in his stomach. He is unsure if he is vomiting blood. Reports he isn't vomiting black coffee ground material as he reported earlier, but dark brown. Reports that he has nausea and a burning sensation in his throat. Reports vomited x 4 since noon.  Does the patient have any new or worsening symptoms? ---Yes  Will a triage be completed? ---Yes  Related visit to physician within the last 2 weeks? ---N/A  Does the PT have any chronic conditions? (i.e. diabetes, asthma, etc.) ---Unknown  Is this a behavioral health or substance abuse call? ---No     Guidelines    Guideline Title Affirmed Question Affirmed Notes  Vomiting MILD or MODERATE vomiting (e.g., 1 - 5 times / day) (all triage questions negative)    Final Disposition User   [place], RN, Suanne Marker    Disagree/Comply: Comply

## 2016-10-08 NOTE — Telephone Encounter (Signed)
Spoke with pt and he states that he had drank coffee and chocolate milk prior to having the "burning" sensation in his stomach and then vomiting. This would possibly attribute to the "brown" colored emesis. Pt states that he has not vomited since noon today. He does report increased saliva production and when he spits it is clear. Advised pt to eat crackers or toast, something with carbs to help his stomach, and to stay hydrated. Also advised pt to call office if no improvement or if colored emesis continues to seek UC/ED care. Pt voiced understanding. Nothing further needed at this time.

## 2016-10-08 NOTE — Telephone Encounter (Signed)
[facility]  Patient Name: [name]  DOB: 1956/07/20    Initial Comment Caller states he has high blood pressure and takes meds for it. Takes men's daily vitamins. Vomiting brown liquid. Chest pain, feels like lump on chest. Wondering if vitamins could be having negative reaction with some of his meds.    Nurse Assessment  Nurse: Harlow Mares, RN, Suanne Marker Date/Time (Eastern Time): 10/08/2016 2:04:05 PM  Confirm and document reason for call. If symptomatic, describe symptoms. ---Caller states he has high blood pressure and takes meds for it. Takes men's daily vitamins. Vomiting brown liquid. Chest pain, feels like lump on chest. Wondering if vitamins could be having negative reaction with some of his meds. Vomited x 4 in the last 12 hours. Reports vomiting coffee ground type material.  Does the patient have any new or worsening symptoms? ---Yes  Will a triage be completed? ---Yes  Related visit to physician within the last 2 weeks? ---No  Does the PT have any chronic conditions? (i.e. diabetes, asthma, etc.) ---Yes  List chronic conditions. ---HTN;  Is this a behavioral health or substance abuse call? ---No     Guidelines    Guideline Title Affirmed Question Affirmed Notes  Vomiting [1] Vomiting AND [2] contains red blood or black ("coffee ground") material (Exception: few red streaks in vomit that only happened once)    Final Disposition User   Go to ED Now Harlow Mares, RN, [place]    Referrals  [facility] - ED   Disagree/Comply: Comply

## 2016-10-08 NOTE — Telephone Encounter (Signed)
Please see additional TE dated 10/08/16.

## 2016-10-11 ENCOUNTER — Telehealth: Payer: Self-pay | Admitting: Internal Medicine

## 2016-10-11 ENCOUNTER — Encounter: Payer: Self-pay | Admitting: Internal Medicine

## 2016-10-11 DIAGNOSIS — K219 Gastro-esophageal reflux disease without esophagitis: Secondary | ICD-10-CM | POA: Diagnosis not present

## 2016-10-11 NOTE — Telephone Encounter (Signed)
Patient Name: [name]  DOB: 17-Jul-1956    Initial Comment Caller states that he has been vomiting for a while. The vomiting came back again and he thought it was acid reflux. He has chest and throat burning like acid reflux. He wants to see the doctor. He did take his blood pressure medicine this morning and he ate a banana and a big breakfast. then he went to work and started to feel it the burning then he vomits.    Nurse Assessment      Guidelines    Guideline Title Affirmed Question Affirmed Notes       Final Disposition User   FINAL ATTEMPT MADE - message left Raphael Gibney, Therapist, sports, Vanita Ingles

## 2016-10-11 NOTE — Telephone Encounter (Signed)
Spoke with pt, he is currently in UC waiting to be seen. Advised pt to call us if he needs follow up OV. Pt voiced understanding. Nothing further needed at this time.

## 2016-11-02 IMAGING — RF DG RETROGRADE-URETHROGRAM
1 series · 7 of 7 positions shown · non-contrast
Comparison: CT, 12/29/2015

CLINICAL DATA: Urethral stricture.

EXAM:
INTRAOPERATIVE RETROGRADE URETHROGRAPHY
TECHNIQUE: Images were obtained with the C-arm fluoroscopic device
intraoperatively and submitted for interpretation post- procedure.
Please see the procedural report for the amount of contrast and the
fluoroscopy time utilized.

[Series 1: run · 7 of 7 slices shown]
[im 1/7]
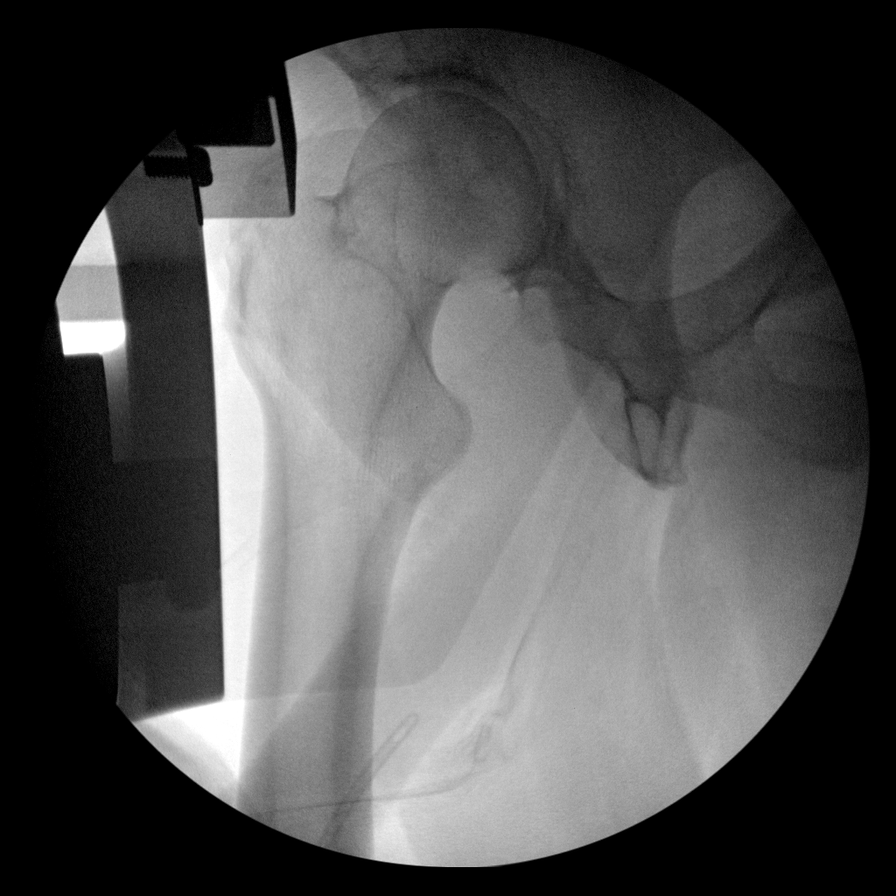
[im 2/7]
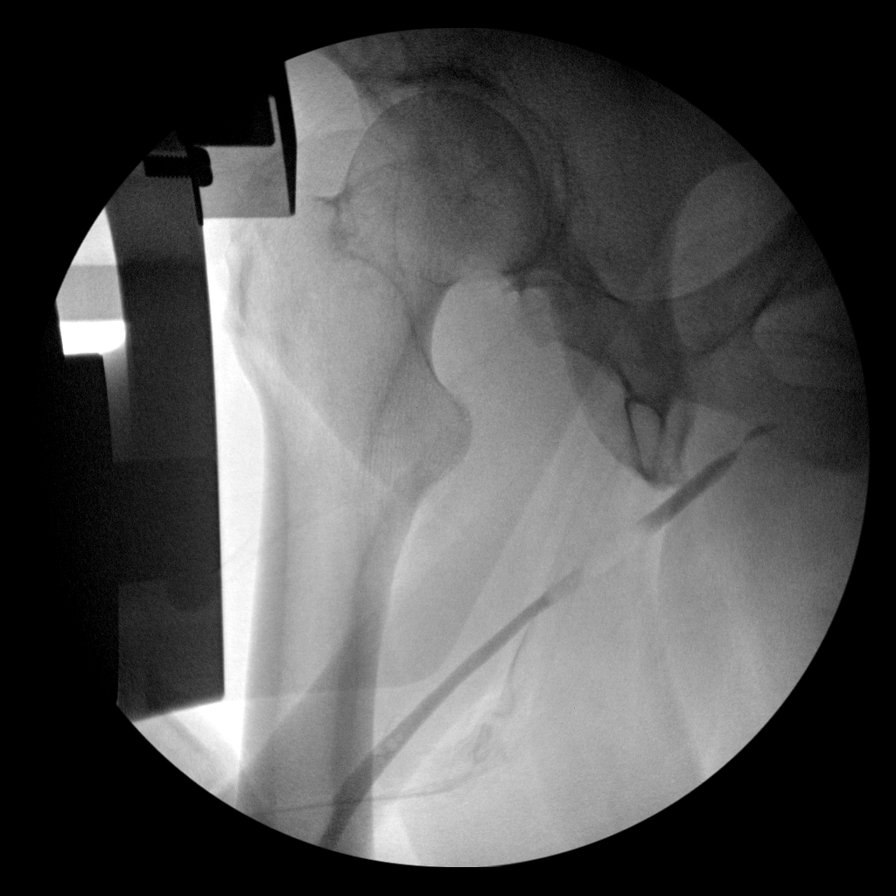
[im 3/7]
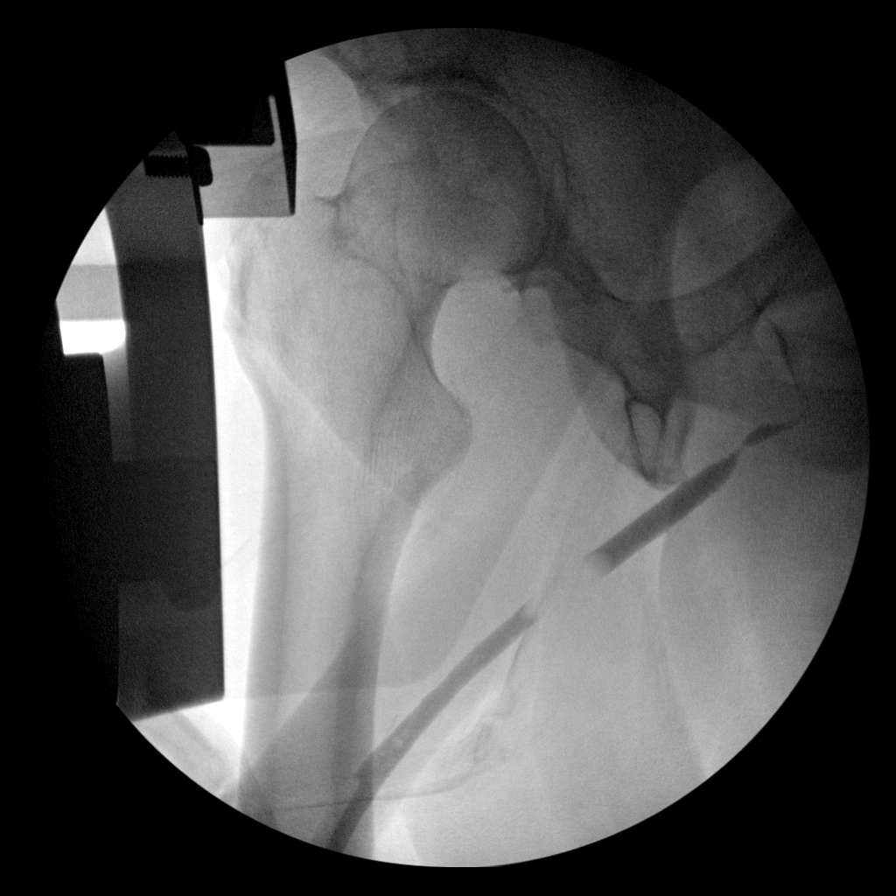
[im 4/7]
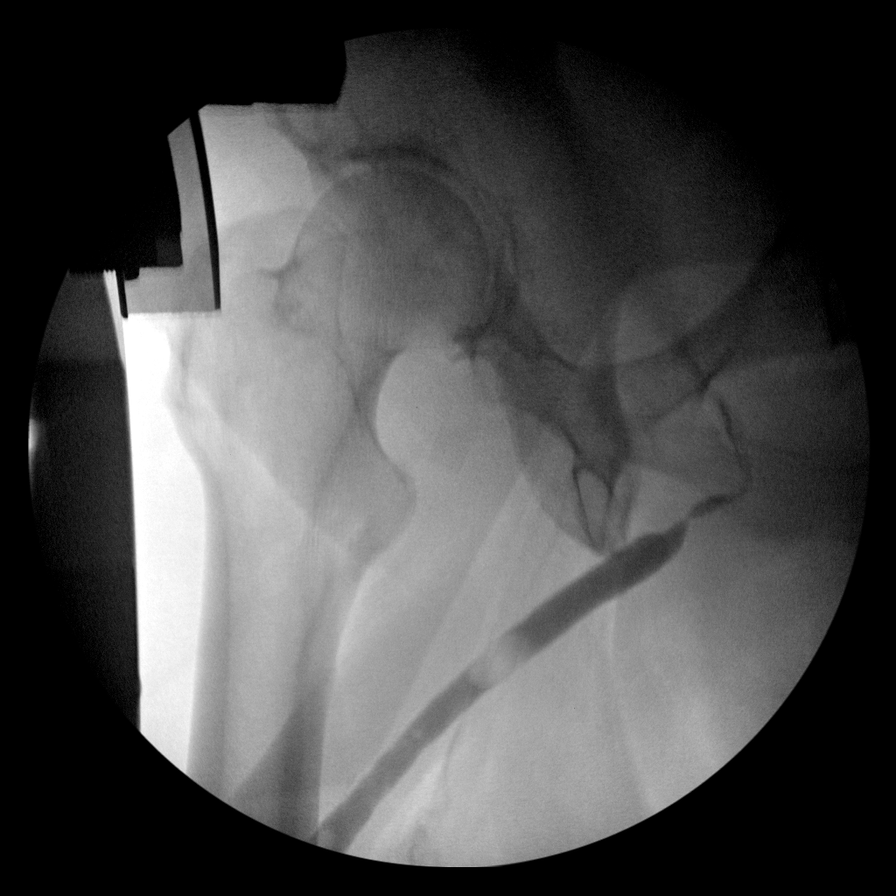
[im 5/7]
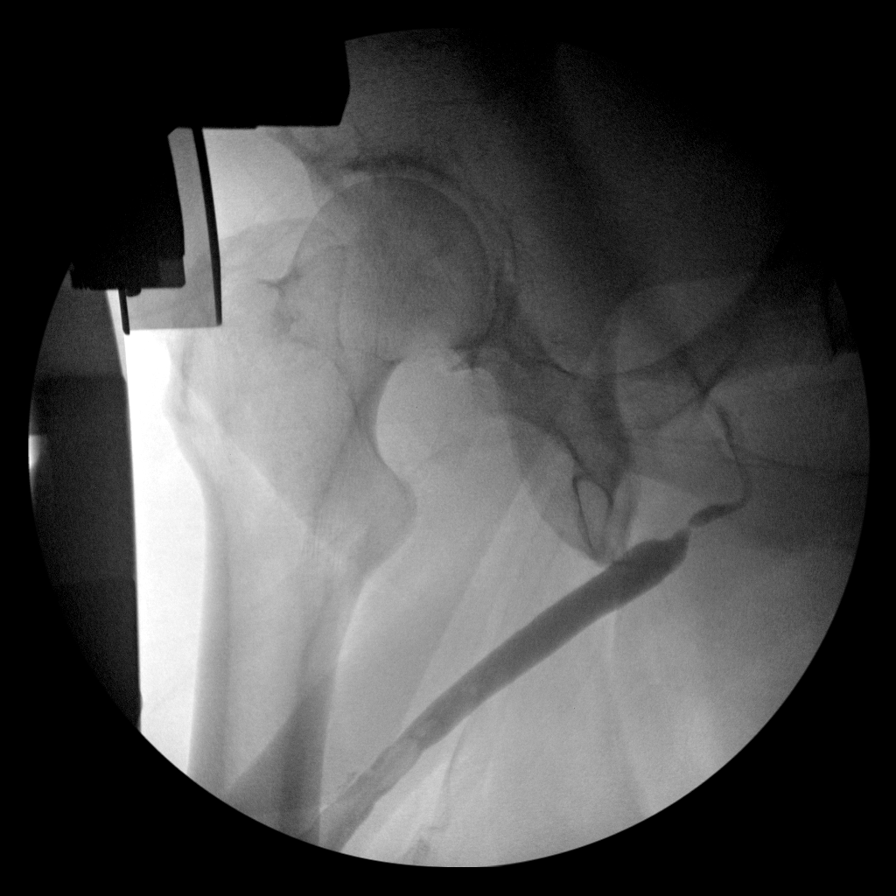
[im 6/7]
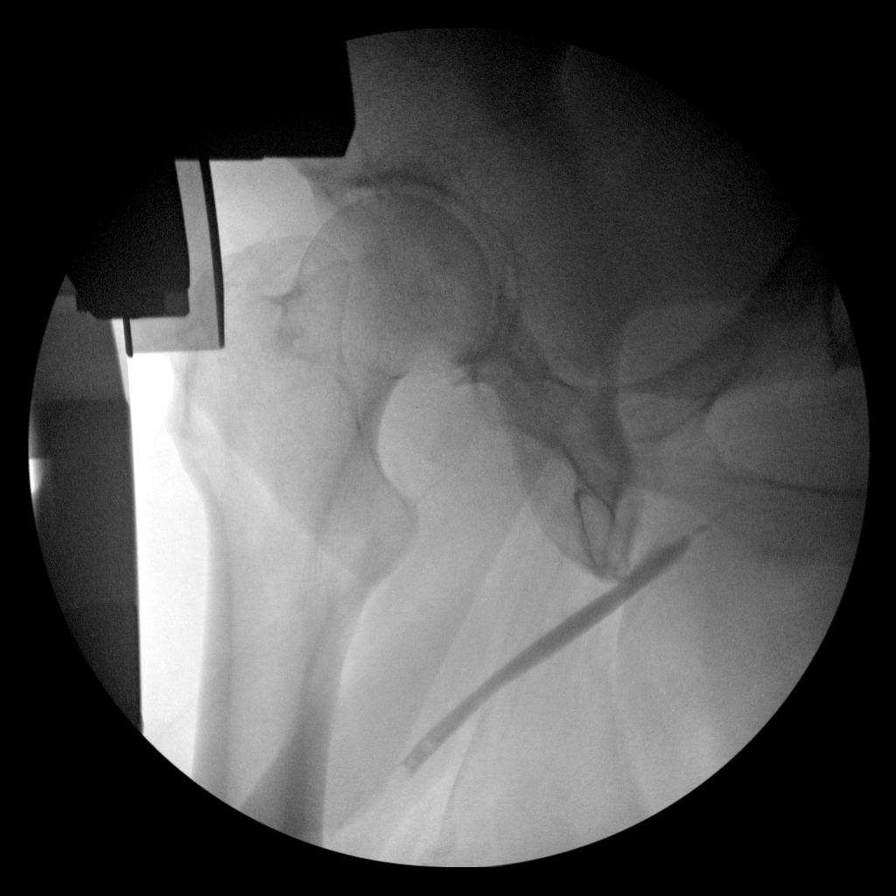
[im 7/7]
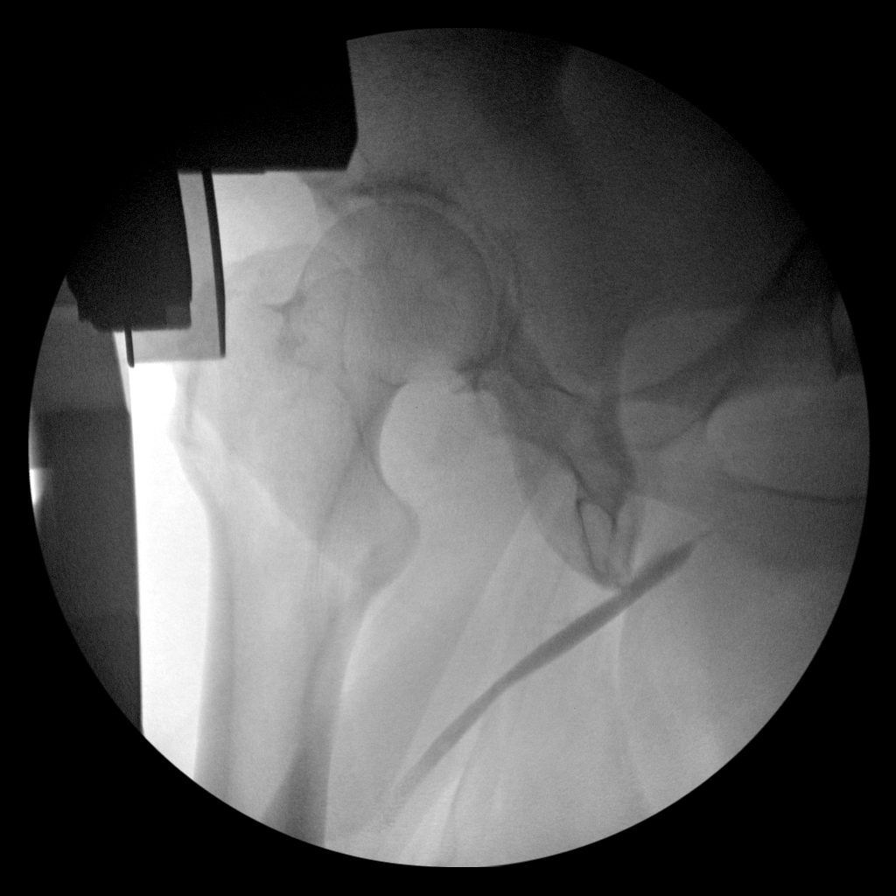

[7 of 7 positions shown; findings below may reference images not displayed]

FINDINGS: Retrograde opacification of the urethra shows narrowing at the
junction membranous and bulbous urethra. No other areas of
narrowing.
IMPRESSION: Stricture near or at the junction of the membranous and bulbous
urethra.

## 2016-11-02 IMAGING — DX DG CHEST 2V
2 series · 2 of 2 positions shown · non-contrast
Comparison: Chest radiograph 12/27/2015.

CLINICAL DATA: Patient for cystoscopy. Preoperative evaluation.
Shortness of breath.

EXAM:
CHEST  2 VIEW

[chest pa]
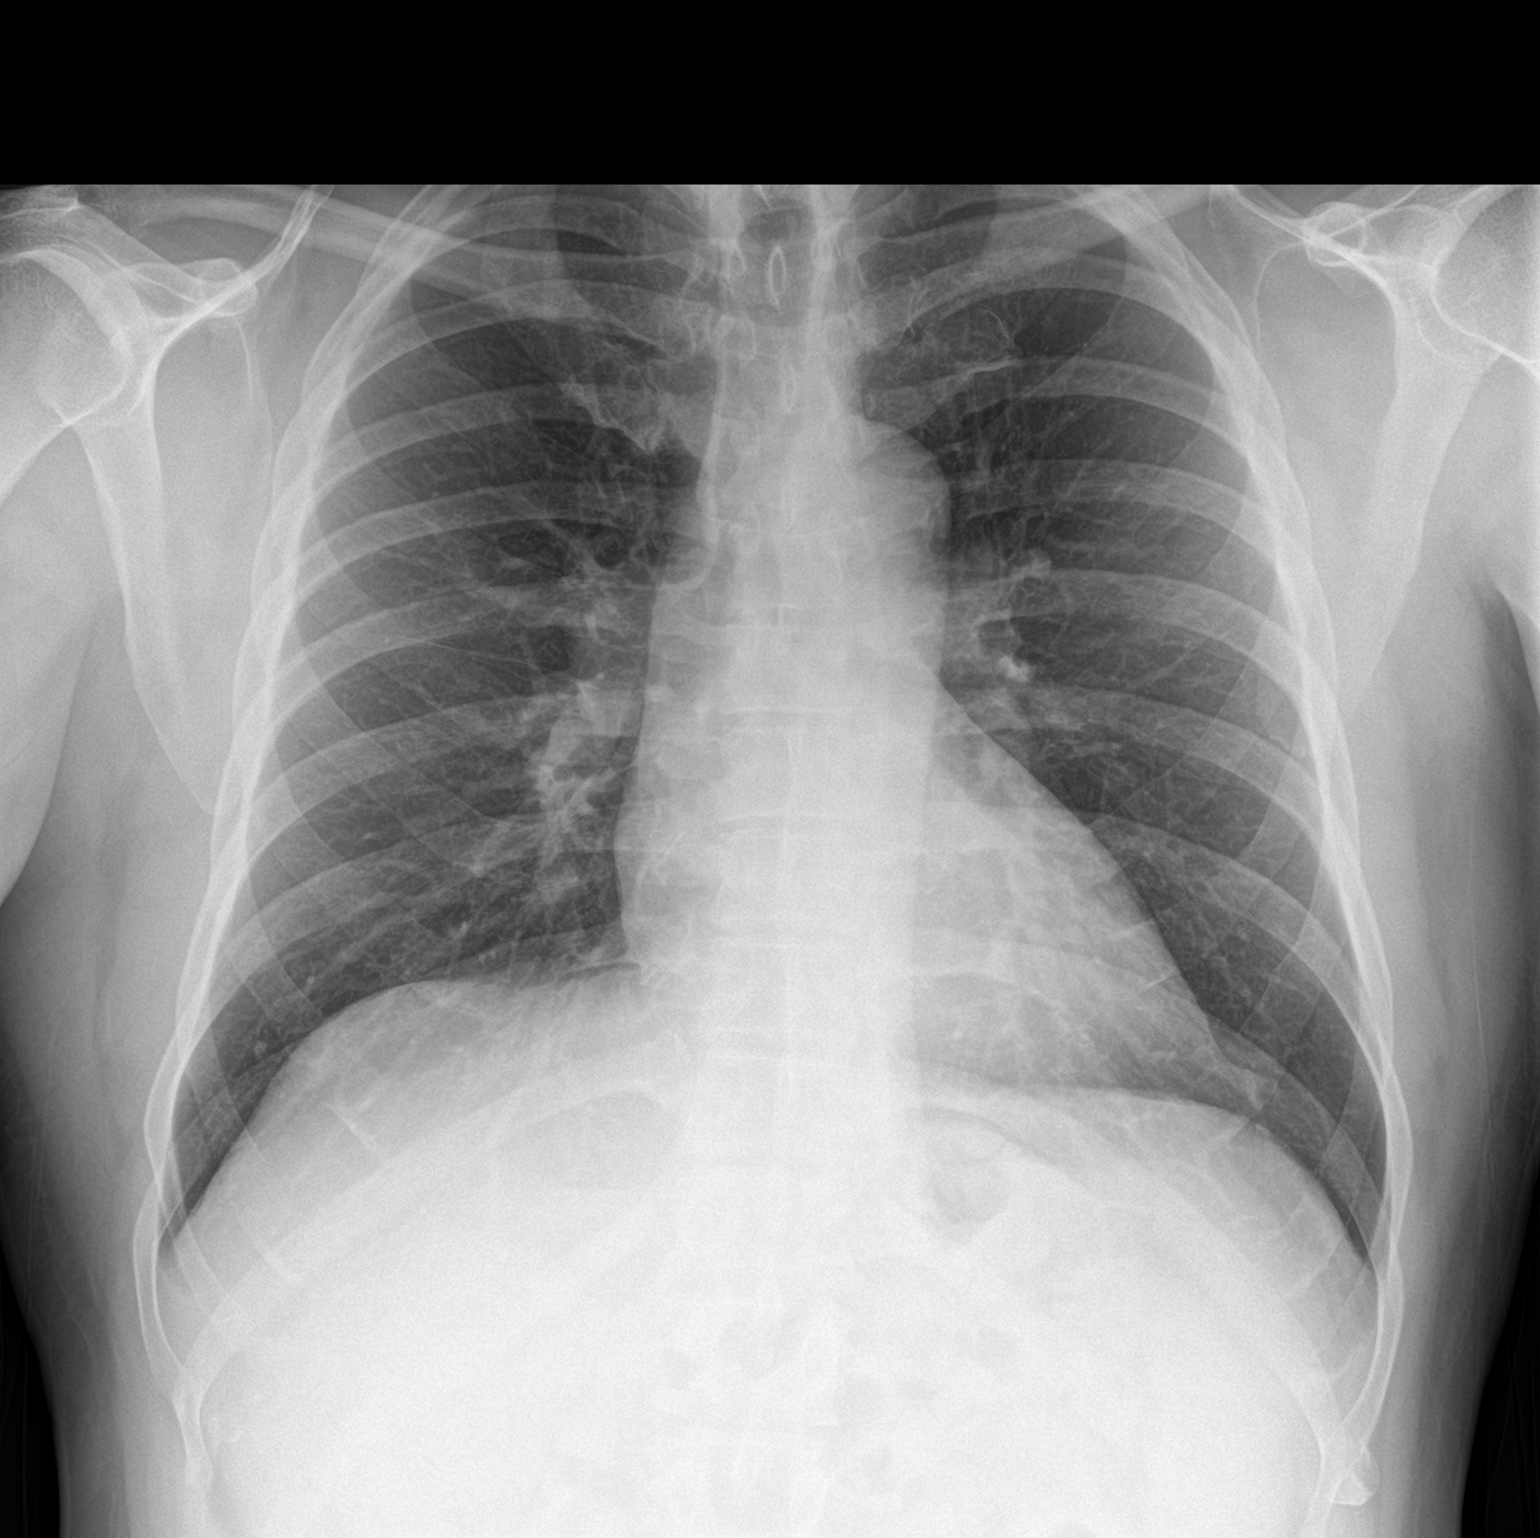

[chest lat]
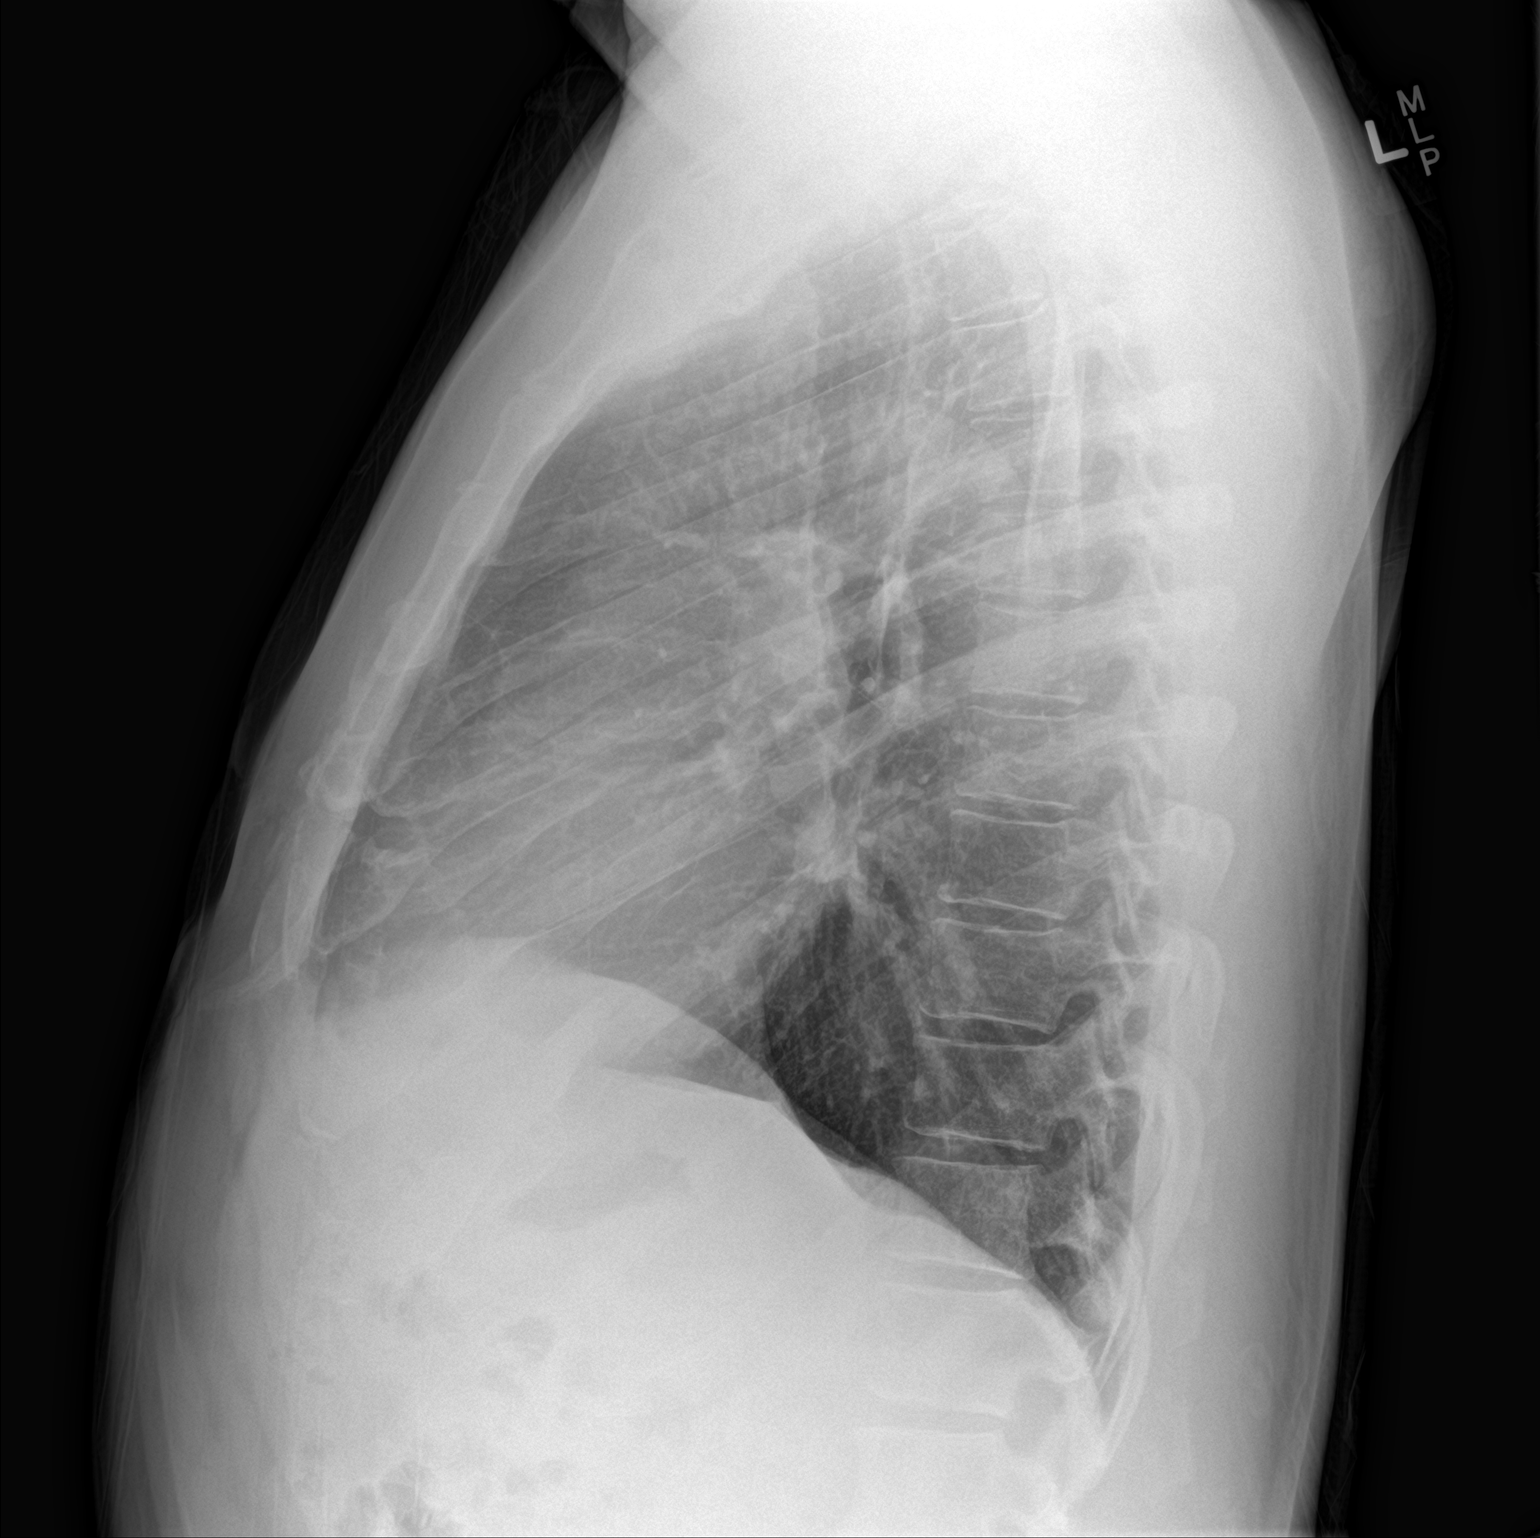

[2 of 2 positions shown; findings below may reference images not displayed]

FINDINGS: Stable cardiac and mediastinal contours. No consolidative pulmonary
opacities. No pleural effusion or pneumothorax. Regional skeleton is
unremarkable.
IMPRESSION: No active cardiopulmonary disease.

## 2016-11-05 ENCOUNTER — Encounter: Payer: BLUE CROSS/BLUE SHIELD | Admitting: Internal Medicine

## 2016-11-13 ENCOUNTER — Other Ambulatory Visit: Payer: Self-pay | Admitting: Internal Medicine

## 2016-11-18 ENCOUNTER — Ambulatory Visit: Payer: BLUE CROSS/BLUE SHIELD | Admitting: Internal Medicine

## 2016-12-10 ENCOUNTER — Telehealth: Payer: Self-pay | Admitting: Internal Medicine

## 2016-12-10 ENCOUNTER — Other Ambulatory Visit: Payer: Self-pay | Admitting: Internal Medicine

## 2016-12-10 MED ORDER — HYDROCODONE-ACETAMINOPHEN 10-325 MG PO TABS
1.0000 | ORAL_TABLET | Freq: Four times a day (QID) | ORAL | 0 refills | Status: DC | PRN
Start: 2016-12-10 — End: 2017-04-14

## 2016-12-10 MED ORDER — HYDROCODONE-ACETAMINOPHEN 10-325 MG PO TABS: 1.0000 | ORAL_TABLET | Freq: Four times a day (QID) | ORAL | 0 refills | Status: DC | PRN

## 2016-12-10 NOTE — Telephone Encounter (Signed)
Pt notified Rx ready for pickup. Rx printed and signed.  

## 2016-12-10 NOTE — Telephone Encounter (Signed)
Pt need new Rx for Hydrocodone  Pt is aware that Dr. Raliegh Ip is out of the office and aware of the 3 business days for refills and our office will call when ready for pick-up.

## 2016-12-25 ENCOUNTER — Ambulatory Visit (INDEPENDENT_AMBULATORY_CARE_PROVIDER_SITE_OTHER): Payer: BLUE CROSS/BLUE SHIELD | Admitting: Internal Medicine

## 2016-12-25 ENCOUNTER — Encounter: Payer: Self-pay | Admitting: Internal Medicine

## 2016-12-25 VITALS — BP 124/70 | HR 68 | Ht 69.0 in | Wt 224.0 lb

## 2016-12-25 DIAGNOSIS — K920 Hematemesis: Secondary | ICD-10-CM | POA: Diagnosis not present

## 2016-12-25 DIAGNOSIS — R12 Heartburn: Secondary | ICD-10-CM | POA: Diagnosis not present

## 2016-12-25 DIAGNOSIS — Z1211 Encounter for screening for malignant neoplasm of colon: Secondary | ICD-10-CM | POA: Diagnosis not present

## 2016-12-25 DIAGNOSIS — R1031 Right lower quadrant pain: Secondary | ICD-10-CM | POA: Diagnosis not present

## 2016-12-25 NOTE — Progress Notes (Signed)
[name] 61 y.o. 05/22/1956 [id number] Referred by: Marletta Lor, MD  Assessment & Plan:   Encounter Diagnoses  Name Primary?  . Coffee ground emesis Yes  . Heartburn   . Right groin pain   . Colon cancer screening     Await upper GI symptoms with EGD. It does sound like he had coffee ground emesis. He is improved but starting to have some recurrent dyspeptic like symptoms and heartburn.  He will also undergo screening colonoscopy he has requested this has never had one before.  The risks and benefits as well as alternatives of endoscopic procedure(s) have been discussed and reviewed. All questions answered. The patient agrees to proceed.  I think his right groin pain may be related to scar tissue from herniorrhaphy  I appreciate the opportunity to care for this patient. CC: Nyoka Cowden, MD    Subjective:   Chief Complaint:  Heartburn indigestion, colon cancer screening  HPI The patient is a 61 year old African-American man here with his wife, for evaluation of previous vomiting of coffee ground material, indigestion and heartburn symptoms. Really had a sudden onset of this in January, he went to an urgent care, and was prescribed generic Nexium at 40 mg daily. He took that for several days and improved significantly and stopped and was using it as needed as he still is. He had been doing well but he starting to have more postprandial burning epigastric and lower sternal discomfort. There is no dysphagia. He denies any melena or rectal bleeding. The urgent care suggest that he needed an endoscopy, he is concerned about possible problems and is very interested in having an evaluation. GI review of systems is otherwise negative except for some right lower quadrant discomfort, he had a right inguinal hernia repair and sometime after that he is developed intermittent pain or discomfort there that he says feels like a "strain"  Allergies  Allergen  Reactions  . Lisinopril Cough   Current Meds  Medication Sig  . amLODipine (NORVASC) 5 MG tablet TAKE 1 TABLET (5 MG TOTAL) BY MOUTH DAILY.  Marland Kitchen colchicine 0.6 MG tablet Take 1 tablet (0.6 mg total) by mouth 2 (two) times daily.  Marland Kitchen HYDROcodone-acetaminophen (NORCO) 10-325 MG tablet Take 1 tablet by mouth every 6 (six) hours as needed.  Marland Kitchen ibuprofen (ADVIL,MOTRIN) 600 MG tablet Take 1 tablet (600 mg total) by mouth every 6 (six) hours as needed for headache.  . losartan (COZAAR) 100 MG tablet TAKE 1 TABLET (100 MG TOTAL) BY MOUTH DAILY.  . Multiple Vitamins-Minerals (MENS 50+ MULTI VITAMIN/MIN PO) Take 1 tablet by mouth daily.  . naproxen (NAPROSYN) 500 MG tablet Take 1 tablet (500 mg total) by mouth 2 (two) times daily.   Past Medical History:  Diagnosis Date  . History of gout   . Hypertension   . Sepsis [facility]) hospitalized 12/25/2015   Past Surgical History:  Procedure Laterality Date  . CYSTOSCOPY WITH DIRECT VISION INTERNAL URETHROTOMY N/A 01/25/2016   Procedure:  DIRECT VISION INTERNAL URETHROTOMY;  Surgeon: Ardis Hughs, MD;  Location: [facility];  Service: Urology;  Laterality: N/A;  . CYSTOSCOPY WITH RETROGRADE URETHROGRAM N/A 01/25/2016   Procedure:  RETROGRADE URETHROGRAM;  Surgeon: Ardis Hughs, MD;  Location: [facility];  Service: Urology;  Laterality: N/A;  . INGUINAL HERNIA REPAIR Right    family history includes Diabetes Mellitus II in his paternal grandfather.  Social History   Social History  . Marital status: Legally Separated  Spouse name: N/A  . Number of children: 3  . Years of education: N/A   Occupational History  . driver    Social History Main Topics  . Smoking status: Never Smoker  . Smokeless tobacco: Never Used  . Alcohol use 3.6 oz/week    6 Glasses of wine per week  . Drug use: No  . Sexual activity: Not Currently   Other Topics Concern  . None   Social History Narrative   Marital status is separated   Employed as a Education officer, community   One  son to daughters born in the 1980s, he has one grandchild and 1 great grandchild   12/25/2016       Review of Systems All other review of systems negative except as in history of present illness  Objective:   Physical Exam @BP  124/70 (BP Location: Left Arm, Patient Position: Sitting, Cuff Size: Normal)   Pulse 68   Ht 5\' 9"  (1.753 m) Comment: height measured without shoes  Wt 224 lb (101.6 kg)   BMI 33.08 kg/m @  General:  Well-developed, well-nourished and in no acute distress Eyes:  anicteric. ENT:   Mouth and posterior pharynx free of lesions.  Neck:   supple w/o thyromegaly or mass.  Lungs: Clear to auscultation bilaterally. Heart:  S1S2, no rubs, murmurs, gallops. Abdomen:  soft, non-tender, no hepatosplenomegaly, hernia, or mass and BS+. Right groin mildly tender slight bulge, no LA Rectal: Deferred until colonoscopy Lymph:  no cervical or supraclavicular adenopathy. Extremities:   no edema, cyanosis or clubbing Skin   no rash. Neuro:  A&O x 3.  Psych:  appropriate mood and  Affect.   Data Reviewed: CT scan abdomen and pelvis April 2017 no sign of inguinal hernia but did have a small fat-containing umbilical hernia

## 2016-12-25 NOTE — Patient Instructions (Addendum)
You have been scheduled for an endoscopy and colonoscopy. Please follow the written instructions given to you at your visit today. Please pick up your prep supplies at the pharmacy within the next 1-3 days. If you use inhalers (even only as needed), please bring them with you on the day of your procedure. Your physician has requested that you go to www.startemmi.com and enter the access code given to you at your visit today. This web site gives a general overview about your procedure. However, you should still follow specific instructions given to you by our office regarding your preparation for the procedure.  We have sent the following medications to your pharmacy for you to pick up at your convenience: Suprep  If you are age 26 or older, your body mass index should be between 23-30. Your Body mass index is 33.08 kg/m. If this is out of the aforementioned range listed, please consider follow up with your Primary Care Provider.  If you are age 28 or younger, your body mass index should be between 19-25. Your Body mass index is 33.08 kg/m. If this is out of the aformentioned range listed, please consider follow up with your Primary Care Provider.   I appreciate the opportunity to care for you.  Gatha Mayer, Artist Pais., [facility]

## 2017-01-16 ENCOUNTER — Encounter: Payer: Self-pay | Admitting: Internal Medicine

## 2017-01-27 ENCOUNTER — Ambulatory Visit (AMBULATORY_SURGERY_CENTER): Payer: BLUE CROSS/BLUE SHIELD | Admitting: Internal Medicine

## 2017-01-27 ENCOUNTER — Encounter: Payer: Self-pay | Admitting: Internal Medicine

## 2017-01-27 VITALS — BP 120/84 | HR 73 | Temp 98.0°F | Resp 14 | Ht 69.0 in | Wt 224.0 lb

## 2017-01-27 DIAGNOSIS — D125 Benign neoplasm of sigmoid colon: Secondary | ICD-10-CM | POA: Diagnosis not present

## 2017-01-27 DIAGNOSIS — Z1212 Encounter for screening for malignant neoplasm of rectum: Secondary | ICD-10-CM | POA: Diagnosis not present

## 2017-01-27 DIAGNOSIS — K920 Hematemesis: Secondary | ICD-10-CM | POA: Diagnosis not present

## 2017-01-27 DIAGNOSIS — Z1211 Encounter for screening for malignant neoplasm of colon: Secondary | ICD-10-CM

## 2017-01-27 DIAGNOSIS — K449 Diaphragmatic hernia without obstruction or gangrene: Secondary | ICD-10-CM

## 2017-01-27 MED ORDER — SODIUM CHLORIDE 0.9 % IV SOLN: 500.0000 mL | INTRAVENOUS | Status: DC

## 2017-01-27 NOTE — Op Note (Signed)
[place] Patient Name: [name] Procedure Date: 01/27/2017 3:19 PM MRN: [id number] Endoscopist: Gatha Mayer , MD Age: 61 Referring MD:  Date of Birth: 1956/02/03 Gender: Male Account #: [id number] Procedure:                Upper GI endoscopy Indications:              Heartburn, Coffee-ground emesis Medicines:                Propofol per Anesthesia, Monitored Anesthesia Care Procedure:                Pre-Anesthesia Assessment:                           - Prior to the procedure, a History and Physical                            was performed, and patient medications and                            allergies were reviewed. The patient's tolerance of                            previous anesthesia was also reviewed. The risks                            and benefits of the procedure and the sedation                            options and risks were discussed with the patient.                            All questions were answered, and informed consent                            was obtained. Prior Anticoagulants: The patient                            last took ibuprofen 3 days prior to the procedure.                            ASA Grade Assessment: II - A patient with mild                            systemic disease. After reviewing the risks and                            benefits, the patient was deemed in satisfactory                            condition to undergo the procedure.                           After obtaining informed consent, the endoscope was  passed under direct vision. Throughout the                            procedure, the patient's blood pressure, pulse, and                            oxygen saturations were monitored continuously. The                            Endoscope was introduced through the mouth, and                            advanced to the second part of duodenum. The upper                            GI endoscopy  was accomplished without difficulty.                            The patient tolerated the procedure well. Scope In: Scope Out: Findings:                 A small hiatal hernia was present.                           The exam was otherwise without abnormality.                           The cardia and gastric fundus were normal on                            retroflexion. Complications:            No immediate complications. Estimated Blood Loss:     Estimated blood loss: none. Impression:               - Small hiatal hernia.                           - The examination was otherwise normal.                           - No specimens collected. Recommendation:           - Patient has a contact number available for                            emergencies. The signs and symptoms of potential                            delayed complications were discussed with the                            patient. Return to normal activities tomorrow.                            Written discharge instructions were provided to the  patient.                           - Resume previous diet.                           - Continue present medications.                           - See the other procedure note for documentation of                            additional recommendations.                           - Follow an antireflux regimen.                           - Recommend acid suppression medication as needed. Gatha Mayer, MD 01/27/2017 3:51:56 PM This report has been signed electronically.

## 2017-01-27 NOTE — Patient Instructions (Addendum)
You have a small hiatal hernia - common and often related to acid reflux. The esophagus stomach and upper small intestine lining all look fine.  If you are having frequent heartburn symptoms you should take an acid blocking medication once a day if you are avoiding caffeine and other foods that trigger reflux.  I found and removed one polyp from the colon. I will let you know pathology results and when to have another routine colonoscopy by mail and/or My Chart.  I think the right groin pain and discomfort is related to the hernia repair. You should talk to your surgeon about that to see if anything else can be done.  I appreciate the opportunity to care for you. Gatha Mayer, MD, FACG     YOU HAD AN ENDOSCOPIC PROCEDURE TODAY AT [facility]:   Refer to the procedure report that was given to you for any specific questions about what was found during the examination.  If the procedure report does not answer your questions, please call your gastroenterologist to clarify.  If you requested that your care partner not be given the details of your procedure findings, then the procedure report has been included in a sealed envelope for you to review at your convenience later.  YOU SHOULD EXPECT: Some feelings of bloating in the abdomen. Passage of more gas than usual.  Walking can help get rid of the air that was put into your GI tract during the procedure and reduce the bloating. If you had a lower endoscopy (such as a colonoscopy or flexible sigmoidoscopy) you may notice spotting of blood in your stool or on the toilet paper. If you underwent a bowel prep for your procedure, you may not have a normal bowel movement for a few days.  Please Note:  You might notice some irritation and congestion in your nose or some drainage.  This is from the oxygen used during your procedure.  There is no need for concern and it should clear up in a day or so.  SYMPTOMS TO REPORT  IMMEDIATELY:   Following lower endoscopy (colonoscopy or flexible sigmoidoscopy):  Excessive amounts of blood in the stool  Significant tenderness or worsening of abdominal pains  Swelling of the abdomen that is new, acute  Fever of 100F or higher   Following upper endoscopy (EGD)  Vomiting of blood or coffee ground material  New chest pain or pain under the shoulder blades  Painful or persistently difficult swallowing  New shortness of breath  Fever of 100F or higher  Black, tarry-looking stools  For urgent or emergent issues, a gastroenterologist can be reached at any hour by calling [phone].   DIET:  We do recommend a small meal at first, but then you may proceed to your regular diet.  Drink plenty of fluids but you should avoid alcoholic beverages for 24 hours.  ACTIVITY:  You should plan to take it easy for the rest of today and you should NOT DRIVE or use heavy machinery until tomorrow (because of the sedation medicines used during the test).    FOLLOW UP: Our staff will call the number listed on your records the next business day following your procedure to check on you and address any questions or concerns that you may have regarding the information given to you following your procedure. If we do not reach you, we will leave a message.  However, if you are feeling well and you are not experiencing any problems,  there is no need to return our call.  We will assume that you have returned to your regular daily activities without incident.  If any biopsies were taken you will be contacted by phone or by letter within the next 1-3 weeks.  Please call us at [phone] if you have not heard about the biopsies in 3 weeks.    SIGNATURES/CONFIDENTIALITY: You and/or your care partner have signed paperwork which will be entered into your electronic medical record.  These signatures attest to the fact that that the information above on your After Visit Summary has been  reviewed and is understood.  Full responsibility of the confidentiality of this discharge information lies with you and/or your care-partner.   Resume medications. Information given on Hiatal Hernia and polyps.

## 2017-01-27 NOTE — Op Note (Signed)
[facility] Patient Name: [name] Procedure Date: 01/27/2017 3:18 PM MRN: [id number] Endoscopist: Gatha Mayer , MD Age: 61 Referring MD:  Date of Birth: Apr 29, 1956 Gender: Male Account #: [id number] Procedure:                Colonoscopy Indications:              Screening for colorectal malignant neoplasm, This                            is the patient's first colonoscopy Medicines:                Propofol per Anesthesia, Monitored Anesthesia Care Procedure:                Pre-Anesthesia Assessment:                           - Prior to the procedure, a History and Physical                            was performed, and patient medications and                            allergies were reviewed. The patient's tolerance of                            previous anesthesia was also reviewed. The risks                            and benefits of the procedure and the sedation                            options and risks were discussed with the patient.                            All questions were answered, and informed consent                            was obtained. Prior Anticoagulants: The patient                            last took ibuprofen 4 days prior to the procedure.                            ASA Grade Assessment: II - A patient with mild                            systemic disease. After reviewing the risks and                            benefits, the patient was deemed in satisfactory                            condition to undergo the procedure.  After obtaining informed consent, the colonoscope                            was passed under direct vision. Throughout the                            procedure, the patient's blood pressure, pulse, and                            oxygen saturations were monitored continuously. The                            Colonoscope was introduced through the anus and                            advanced to the  the cecum, identified by                            appendiceal orifice and ileocecal valve. The                            colonoscopy was performed without difficulty. The                            patient tolerated the procedure well. The quality                            of the bowel preparation was excellent. The bowel                            preparation used was Miralax. The ileocecal valve,                            appendiceal orifice, and rectum were photographed. Scope In: 3:28:49 PM Scope Out: 3:43:03 PM Scope Withdrawal Time: 0 hours 10 minutes 6 seconds  Total Procedure Duration: 0 hours 14 minutes 14 seconds  Findings:                 The perianal and digital rectal examinations were                            normal. Pertinent negatives include normal prostate                            (size, shape, and consistency).                           A 12 mm polyp was found in the sigmoid colon. The                            polyp was pedunculated. The polyp was removed with                            a hot snare. Resection and retrieval were  complete.                            Verification of patient identification for the                            specimen was done. Estimated blood loss: none.                           Multiple small and large-mouthed diverticula were                            found in the sigmoid colon.                           The exam was otherwise without abnormality on                            direct and retroflexion views. Complications:            No immediate complications. Estimated Blood Loss:     Estimated blood loss: none. Impression:               - One 12 mm polyp in the sigmoid colon, removed                            with a hot snare. Resected and retrieved.                           - Diverticulosis in the sigmoid colon.                           - The examination was otherwise normal on direct                            and  retroflexion views. Recommendation:           - Patient has a contact number available for                            emergencies. The signs and symptoms of potential                            delayed complications were discussed with the                            patient. Return to normal activities tomorrow.                            Written discharge instructions were provided to the                            patient.                           - Resume previous diet.                           -  Continue present medications.                           - Repeat colonoscopy is recommended. The                            colonoscopy date will be determined after pathology                            results from today's exam become available for                            review. Gatha Mayer, MD 01/27/2017 3:57:08 PM This report has been signed electronically.

## 2017-01-27 NOTE — Progress Notes (Signed)
Called to room to assist during endoscopic procedure.  Patient ID and intended procedure confirmed with present staff. Received instructions for my participation in the procedure from the performing physician.  

## 2017-01-27 NOTE — Progress Notes (Signed)
Report given to PACU, vss 

## 2017-01-28 ENCOUNTER — Telehealth: Payer: Self-pay | Admitting: *Deleted

## 2017-01-28 ENCOUNTER — Telehealth: Payer: Self-pay

## 2017-01-28 NOTE — Telephone Encounter (Signed)
  Follow up Call-  Call back number 01/27/2017  Post procedure Call Back phone  # [phone] cell  Permission to leave phone message Yes  Some recent data might be hidden     No answer at # given.  Left message on VM.

## 2017-01-28 NOTE — Telephone Encounter (Signed)
Left a message at [phone] for the pt to let him know we called to check on him. maw

## 2017-02-01 ENCOUNTER — Encounter: Payer: Self-pay | Admitting: Internal Medicine

## 2017-02-01 DIAGNOSIS — Z86010 Personal history of colonic polyps: Secondary | ICD-10-CM

## 2017-02-01 HISTORY — DX: Personal history of colonic polyps: Z86.010

## 2017-02-01 NOTE — Progress Notes (Signed)
12 TV adenoma Recall colon 2021

## 2017-02-10 DIAGNOSIS — R1031 Right lower quadrant pain: Secondary | ICD-10-CM | POA: Diagnosis not present

## 2017-02-10 DIAGNOSIS — G8918 Other acute postprocedural pain: Secondary | ICD-10-CM | POA: Diagnosis not present

## 2017-02-22 ENCOUNTER — Other Ambulatory Visit: Payer: Self-pay | Admitting: Internal Medicine

## 2017-04-08 ENCOUNTER — Encounter: Payer: Self-pay | Admitting: Internal Medicine

## 2017-04-14 ENCOUNTER — Ambulatory Visit (INDEPENDENT_AMBULATORY_CARE_PROVIDER_SITE_OTHER): Payer: BLUE CROSS/BLUE SHIELD | Admitting: Internal Medicine

## 2017-04-14 ENCOUNTER — Encounter: Payer: Self-pay | Admitting: Internal Medicine

## 2017-04-14 VITALS — BP 120/80 | HR 70 | Temp 98.3°F | Wt 222.2 lb

## 2017-04-14 DIAGNOSIS — I10 Essential (primary) hypertension: Secondary | ICD-10-CM

## 2017-04-14 DIAGNOSIS — M7662 Achilles tendinitis, left leg: Secondary | ICD-10-CM

## 2017-04-14 MED ORDER — HYDROCODONE-ACETAMINOPHEN 10-325 MG PO TABS: 1.0000 | ORAL_TABLET | Freq: Four times a day (QID) | ORAL | 0 refills | Status: DC | PRN

## 2017-04-14 MED ORDER — NAPROXEN 500 MG PO TABS: 500.0000 mg | ORAL_TABLET | Freq: Two times a day (BID) | ORAL | 0 refills | Status: DC

## 2017-04-14 NOTE — Patient Instructions (Addendum)
Achilles Tendinitis  Achilles tendinitis is inflammation of the tough, cord-like band that attaches the lower leg muscles to the heel bone (Achilles tendon). This is usually caused by overusing the tendon and the ankle joint.  Achilles tendinitis usually gets better over time with treatment and caring for yourself at home. It can take weeks or months to heal completely.  What are the causes?  This condition may be caused by:  · A sudden increase in exercise or activity, such as running.  · Doing the same exercises or activities (such as jumping) over and over.  · Not warming up calf muscles before exercising.  · Exercising in shoes that are worn out or not made for exercise.  · Having arthritis or a bone growth (spur) on the back of the heel bone. This can rub against the tendon and hurt it.  · Age-related wear and tear. Tendons become less flexible with age and more likely to be injured.    What are the signs or symptoms?  Common symptoms of this condition include:  · Pain in the Achilles tendon or in the back of the leg, just above the heel. The pain usually gets worse with exercise.  · Stiffness or soreness in the back of the leg, especially in the morning.  · Swelling of the skin over the Achilles tendon.  · Thickening of the tendon.  · Bone spurs at the bottom of the Achilles tendon, near the heel.  · Trouble standing on tiptoe.    How is this diagnosed?  This condition is diagnosed based on your symptoms and a physical exam. You may have tests, including:  · X-rays.  · MRI.    How is this treated?  The goal of treatment is to relieve symptoms and help your injury heal. Treatment may include:  · Decreasing or stopping activities that caused the tendinitis. This may mean switching to low-impact exercises like biking or swimming.  · Icing the injured area.  · Doing physical therapy, including strengthening and stretching exercises.  · NSAIDs to help relieve pain and swelling.  · Using supportive shoes, wraps,  heel lifts, or a walking boot (air cast).  · Surgery. This may be done if your symptoms do not improve after 6 months.  · Using high-energy shock wave impulses to stimulate the healing process (extracorporeal shock wave therapy). This is rare.  · Injection of medicines to help relieve inflammation (corticosteroids). This is rare.    Follow these instructions at home:  If you have an air cast:   · Wear the cast as told by your health care provider. Remove it only as told by your health care provider.  · Loosen the cast if your toes tingle, become numb, or turn cold and blue.  Activity   · Gradually return to your normal activities once your health care provider approves. Do not do activities that cause pain.  ? Consider doing low-impact exercises, like cycling or swimming.  · If you have an air cast, ask your health care provider when it is safe for you to drive.  · If physical therapy was prescribed, do exercises as told by your health care provider or physical therapist.  Managing pain, stiffness, and swelling   · Raise (elevate) your foot above the level of your heart while you are sitting or lying down.  · Move your toes often to avoid stiffness and to lessen swelling.  · If directed, put ice on the injured area:  ?   Put ice in a plastic bag.  ? Place a towel between your skin and the bag.  ? Leave the ice on for 20 minutes, 2-3 times a day  General instructions   · If directed, wrap your foot with an elastic bandage or other wrap. This can help keep your tendon from moving too much while it heals. Your health care provider will show you how to wrap your foot correctly.  · Wear supportive shoes or heel lifts only as told by your health care provider.  · Take over-the-counter and prescription medicines only as told by your health care provider.  · Keep all follow-up visits as told by your health care provider. This is important.  Contact a health care provider if:  · You have symptoms that gets worse.  · You have  pain that does not get better with medicine.  · You develop new, unexplained symptoms.  · You develop warmth and swelling in your foot.  · You have a fever.  Get help right away if:  · You have a sudden popping sound or sensation in your Achilles tendon followed by severe pain.  · You cannot move your toes or foot.  · You cannot put any weight on your foot.  Summary  · Achilles tendinitis is inflammation of the tough, cord-like band that attaches the lower leg muscles to the heel bone (Achilles tendon).  · This condition is usually caused by overusing the tendon and the ankle joint. It can also be caused by arthritis or normal aging.  · The most common symptoms of this condition include pain, swelling, or stiffness in the Achilles tendon or in the back of the leg.  · This condition is usually treated with rest, NSAIDs, and physical therapy.  This information is not intended to replace advice given to you by your health care provider. Make sure you discuss any questions you have with your health care provider.  Document Released: 06/19/2005 Document Revised: 07/29/2016 Document Reviewed: 07/29/2016  Elsevier Interactive Patient Education © 2017 Elsevier Inc.

## 2017-04-14 NOTE — Progress Notes (Signed)
Subjective:    Patient ID: [name], male    DOB: 1955-10-24, 61 y.o.   MRN: [id number]  HPI  61 year old patient who presents with a three-day history of increasing pain involving the left heel area.  He does have a history of gout.  His job requires considerable walking and lifting throughout the day on hard surfaces. Pain intensified today and the patient had a very difficult time walking  Past Medical History:  Diagnosis Date  . Epididymitis   . GERD (gastroesophageal reflux disease)   . Gout of big toe 05/21/2013  . Hard of hearing   . History of gout   . Hx of adenomatous polyp of colon 02/01/2017  . Hypertension   . Orchitis   . Sepsis [facility]) hospitalized 12/25/2015     Social History   Social History  . Marital status: Legally Separated    Spouse name: N/A  . Number of children: 3  . Years of education: N/A   Occupational History  . driver    Social History Main Topics  . Smoking status: Never Smoker  . Smokeless tobacco: Never Used  . Alcohol use 3.6 oz/week    6 Glasses of wine per week  . Drug use: No  . Sexual activity: Not Currently   Other Topics Concern  . Not on file   Social History Narrative   Marital status is separated   Employed as a delivery driver   One son to daughters born in the 1980s, he has one grandchild and 1 great grandchild   12/25/2016       Past Surgical History:  Procedure Laterality Date  . CYSTOSCOPY WITH DIRECT VISION INTERNAL URETHROTOMY N/A 01/25/2016   Procedure:  DIRECT VISION INTERNAL URETHROTOMY;  Surgeon: Ardis Hughs, MD;  Location: [facility];  Service: Urology;  Laterality: N/A;  . CYSTOSCOPY WITH RETROGRADE URETHROGRAM N/A 01/25/2016   Procedure:  RETROGRADE URETHROGRAM;  Surgeon: Ardis Hughs, MD;  Location: [facility];  Service: Urology;  Laterality: N/A;  . INGUINAL HERNIA REPAIR Right   . WISDOM TOOTH EXTRACTION      Family History  Problem Relation Age of Onset  . Diabetes Mellitus II Paternal  Grandfather   . Throat cancer Sister   . Colon cancer Neg Hx   . Esophageal cancer Neg Hx   . Pancreatic cancer Neg Hx   . Prostate cancer Neg Hx   . Rectal cancer Neg Hx   . Stomach cancer Neg Hx     Allergies  Allergen Reactions  . Lisinopril Cough    Current Outpatient Prescriptions on File Prior to Visit  Medication Sig Dispense Refill  . amLODipine (NORVASC) 5 MG tablet TAKE 1 TABLET (5 MG TOTAL) BY MOUTH DAILY. 90 tablet 1  . colchicine 0.6 MG tablet Take 1 tablet (0.6 mg total) by mouth 2 (two) times daily. 20 tablet 0  . ibuprofen (ADVIL,MOTRIN) 600 MG tablet Take 1 tablet (600 mg total) by mouth every 6 (six) hours as needed for headache. 30 tablet 0  . losartan (COZAAR) 100 MG tablet TAKE 1 TABLET (100 MG TOTAL) BY MOUTH DAILY. 90 tablet 1  . Multiple Vitamins-Minerals (MENS 50+ MULTI VITAMIN/MIN PO) Take 1 tablet by mouth daily.     No current facility-administered medications on file prior to visit.     BP 120/80 (BP Location: Right Arm, Patient Position: Sitting, Cuff Size: Normal)   Pulse 70   Temp 98.3 F (36.8 C) (Oral)   Wt  222 lb 3.2 oz (100.8 kg)   SpO2 98%   BMI 32.81 kg/m     Review of Systems  Constitutional: Negative for appetite change, chills, fatigue and fever.  HENT: Negative for congestion, dental problem, ear pain, hearing loss, sore throat, tinnitus, trouble swallowing and voice change.   Eyes: Negative for pain, discharge and visual disturbance.  Respiratory: Negative for cough, chest tightness, wheezing and stridor.   Cardiovascular: Negative for chest pain, palpitations and leg swelling.  Gastrointestinal: Negative for abdominal distention, abdominal pain, blood in stool, constipation, diarrhea, nausea and vomiting.  Genitourinary: Negative for difficulty urinating, discharge, flank pain, genital sores, hematuria and urgency.  Musculoskeletal: Positive for arthralgias and gait problem. Negative for back pain, joint swelling, myalgias and  neck stiffness.  Skin: Negative for rash.  Neurological: Negative for dizziness, syncope, speech difficulty, weakness, numbness and headaches.  Hematological: Negative for adenopathy. Does not bruise/bleed easily.  Psychiatric/Behavioral: Negative for behavioral problems and dysphoric mood. The patient is not nervous/anxious.        Objective:   Physical Exam  Constitutional: He appears well-developed and well-nourished. He appears distressed.  Blood pressure 120/80 Walks with a limp  Musculoskeletal:  No erythema or soft tissue swelling about the left heel Considerable point tenderness noted over the posterior heel at the insertion site of the Achilles tendon          Assessment & Plan:   Achilles tendinitis.  Procedure note.  After topical anesthesia, additional anesthesia with 1% Xylocaine obtained over the posterior heel area, after prep with alcohol cleansing.  1 cc of 80 mg of Depo-Medrol injected about the insertion site of the Achilles tendon.  A bandage applied  Patient information concerning Achilles tendinitis dispensed A note for work dictated  Nyoka Cowden

## 2017-05-01 ENCOUNTER — Other Ambulatory Visit: Payer: Self-pay | Admitting: Internal Medicine

## 2017-05-07 ENCOUNTER — Telehealth: Payer: Self-pay | Admitting: Internal Medicine

## 2017-05-07 NOTE — Telephone Encounter (Signed)
° ° °  Medical  Record request sent to medical Record 04/28/17 for processing

## 2017-05-24 ENCOUNTER — Other Ambulatory Visit: Payer: Self-pay | Admitting: Internal Medicine

## 2017-06-03 ENCOUNTER — Other Ambulatory Visit: Payer: Self-pay | Admitting: Internal Medicine

## 2017-08-30 ENCOUNTER — Other Ambulatory Visit: Payer: Self-pay | Admitting: Internal Medicine

## 2017-10-08 ENCOUNTER — Encounter: Payer: Self-pay | Admitting: Family Medicine

## 2017-10-08 ENCOUNTER — Ambulatory Visit: Payer: Self-pay | Admitting: *Deleted

## 2017-10-08 ENCOUNTER — Ambulatory Visit (INDEPENDENT_AMBULATORY_CARE_PROVIDER_SITE_OTHER): Payer: BLUE CROSS/BLUE SHIELD | Admitting: Family Medicine

## 2017-10-08 VITALS — BP 110/80 | HR 95 | Temp 98.4°F | Wt 226.9 lb

## 2017-10-08 DIAGNOSIS — R252 Cramp and spasm: Secondary | ICD-10-CM

## 2017-10-08 DIAGNOSIS — M791 Myalgia, unspecified site: Secondary | ICD-10-CM

## 2017-10-08 DIAGNOSIS — S86892A Other injury of other muscle(s) and tendon(s) at lower leg level, left leg, initial encounter: Secondary | ICD-10-CM | POA: Diagnosis not present

## 2017-10-08 MED ORDER — DICLOFENAC SODIUM 1 % TD GEL: 2.0000 g | Freq: Four times a day (QID) | TRANSDERMAL | 2 refills | Status: DC

## 2017-10-08 NOTE — Patient Instructions (Signed)
Shin Splints  Shin splints is a painful condition that is felt on the bone that is located in the front of the lower leg (tibia or shin bone) or in the muscles on either side of the bone. This condition happens when physical activities lead to inflammation of the muscles, tendons, and the thin layer that covers the shin bone. It may result from participating in sports or other demanding exercise.  What are the causes?  This condition may be caused by:  · Overuse of muscles.  · Repetitive activities.  · Flat feet or rigid arches.    Activities that could contribute to shin splints include:  · Having a sudden increase in exercise time.  · Starting a new, demanding activity.  · Running up hills or long distances.  · Playing sports that involve sudden starts and stops.  · Using a poor warm-up.  · Wearing old or worn-out shoes.    What are the signs or symptoms?  Symptoms of this condition include:  · Pain on the front of the lower leg.  · Pain while exercising or at rest.    How is this diagnosed?  This condition may be diagnosed based on a physical exam and the history of your symptoms. Your health care provider may observe you as you walk or run. You may also have X-rays or other imaging tests to rule out other problems that could cause lower leg pain, such as a stress fracture.  How is this treated?  Treatment for this condition may depend on your age, history, health, and how bad the pain is. Most cases of shin splints can be managed by doing one or more of the following:  · Resting.  · Reducing the length and intensity of your exercise.  · Stopping the activity that causes shin pain.  · Taking medicines to control the inflammation.  · Icing, massaging, stretching, and strengthening the affected area.  · Wearing shoes that have rigid heels, shock absorption, and a good arch support.    Follow these instructions at home:  Injury care  · If directed, apply ice to the injured area:  ? Put ice in a plastic bag.  ? Place  a towel between your skin and the bag.  ? Leave the ice on for 20 minutes, 2-3 times a day.  · Massage, stretch, and strengthen the affected area as directed by your health care provider.  Activity  · Rest as needed. Return to activity gradually as directed by your health care provider.  · Restart your exercise sessions with non-weight-bearing exercises, such as cycling or swimming.  · Stop running if the pain returns.  · Warm up properly before exercising.  · Run on a surface that is level and fairly firm.  · Gradually change the intensity of an exercise.  · If you increase your running distance, add only 5-10% to your distance each week. This means that if you are running 5 miles this week, you should only increase your run by ¼-½ mile for next week.  General instructions  · Take medicines only as directed by your health care provider.  · Wear shoes that have rigid heels, shock absorption, and a good arch support.  · Change your athletic shoes every 6 months, or every 350-450 miles.  Contact a health care provider if:  · Your symptoms continue or they worsen even after treatment.  · The location, intensity, or type of pain changes over time.  · You have   swelling in your lower leg that gets worse.  · Your shin becomes red and feels warm.  Get help right away if:  · You have severe pain.  · You have trouble walking.  This information is not intended to replace advice given to you by your health care provider. Make sure you discuss any questions you have with your health care provider.  Document Released: 09/06/2000 Document Revised: 05/04/2016 Document Reviewed: 09/05/2014  Elsevier Interactive Patient Education © 2018 Elsevier Inc.

## 2017-10-08 NOTE — Progress Notes (Signed)
Subjective:     Patient ID: [name], male   DOB: 09/09/1956, 62 y.o.   MRN: [id number]  HPI Patient is a work in with the following concerns:  Left lower leg pain. 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 He notices that his pain tends to get better when he has had a few days off work. Worse after prolonged standing. His pain is along the anterior and lateral aspect of the left lower leg. Has not seen any swelling. No significant calf pain. Never tried any icing. No injury. Denies any back pain or radiculitis type symptoms. Nonsmoker. No history of diabetes. Does not describe any claudication-type symptoms.  Frequent muscle cramps involving legs and sometimes even lower abdominal and hands. He states that all his siblings have had similar symptoms. He tries to drink lots of water and has eaten bananas without improvement. No history of hypokalemia. He has hypertension treated with amlodipine and losartan. No diuretic use.  Past Medical History:  Diagnosis Date  . Epididymitis   . GERD (gastroesophageal reflux disease)   . Gout of big toe 05/21/2013  . Hard of hearing   . History of gout   . Hx of adenomatous polyp of colon 02/01/2017  . Hypertension   . Orchitis   . Sepsis [facility]) hospitalized 12/25/2015   Past Surgical History:  Procedure Laterality Date  . CYSTOSCOPY WITH DIRECT VISION INTERNAL URETHROTOMY N/A 01/25/2016   Procedure:  DIRECT VISION INTERNAL URETHROTOMY;  Surgeon: Ardis Hughs, MD;  Location: [facility];  Service: Urology;  Laterality: N/A;  . CYSTOSCOPY WITH RETROGRADE URETHROGRAM N/A 01/25/2016   Procedure:  RETROGRADE URETHROGRAM;  Surgeon: Ardis Hughs, MD;  Location: [facility];  Service: Urology;  Laterality: N/A;  . INGUINAL HERNIA REPAIR Right   . WISDOM TOOTH EXTRACTION      reports that  has never smoked. he has never used smokeless tobacco. He reports that he drinks about 3.6 oz of alcohol per week. He reports that he does not use  drugs. family history includes Diabetes Mellitus II in his paternal grandfather; Throat cancer in his sister. Allergies  Allergen Reactions  . Lisinopril Cough     Review of Systems  Constitutional: Negative for appetite change, chills, fever and unexpected weight change.  Respiratory: Negative for shortness of breath.   Cardiovascular: Negative for chest pain and leg swelling.  Endocrine: Negative for polydipsia and polyuria.  Hematological: Negative for adenopathy.       Objective:   Physical Exam  Constitutional: He appears well-developed and well-nourished.  Neck: Neck supple. No thyromegaly present.  Cardiovascular: Normal rate and regular rhythm.  Feet are warm to touch with good dorsalis pedis pulses bilaterally  Pulmonary/Chest: Effort normal and breath sounds normal. No respiratory distress. He has no wheezes. He has no rales.  Musculoskeletal: He exhibits no edema.  He has some tenderness along the left lateral tibia and some increased tenderness in this region pain with dorsiflexion against resistance. No calf tenderness.  Lymphadenopathy:    He has no cervical adenopathy.  Skin: No rash noted.       Assessment:     #1 shin splints left lower leg  #2 diffuse frequent muscle cramps    Plan:     -Check basic metabolic panel and magnesium level -Check 25-hydroxy vitamin D level -We have recommend try some icing his shin splints 2-3 times daily -Consider trial of topical diclofenac 1% gel 2-3 times daily -If labs normal,  consider trial of tonic water and or B vitamin supplement  [name] MD [facility]

## 2017-10-08 NOTE — Telephone Encounter (Signed)
Pt has an OV with Dr. [name] today (10/08/17) at 1630. Sir please see below message.

## 2017-10-08 NOTE — Telephone Encounter (Signed)
Patient is complaining of left leg pain. It is not going away- it is getting worse. Patient states he has tried massage, insoles, soaks and nothing is helping. He is limping at work and has gone as far as he can go with home care. Patient has had the pain for some time and has been managing it- but has significant change in the last 3 days.  Reason for Disposition . Localized pain, redness or hard lump along vein  Answer Assessment - Initial Assessment Questions 1. ONSET: "When did the pain start?"      6 months- but it has getting worse- 3 days severe 2. LOCATION: "Where is the pain located?"      Left leg below knee- shin and in back of knee and can feel tingle in little toe 3. PAIN: "How bad is the pain?"    (Scale 1-10; or mild, moderate, severe)   -  MILD (1-3): doesn't interfere with normal activities    -  MODERATE (4-7): interferes with normal activities (e.g., work or school) or awakens from sleep, limping    -  SEVERE (8-10): excruciating pain, unable to do any normal activities, unable to walk     6 4. WORK OR EXERCISE: "Has there been any recent work or exercise that involved this part of the body?"      Patient works on feet- has not changed routine 5. CAUSE: "What do you think is causing the leg pain?"     Work boots make worse 6. OTHER SYMPTOMS: "Do you have any other symptoms?" (e.g., chest pain, back pain, breathing difficulty, swelling, rash, fever, numbness, weakness)     Numbness in toes sometimes- when pain is worse 7. PREGNANCY: "Is there any chance you are pregnant?" "When was your last menstrual period?"     n/a  Protocols used: LEG PAIN-A-AH

## 2017-10-09 LAB — BASIC METABOLIC PANEL
BUN: 20 mg/dL (ref 6–23)
CALCIUM: 9.7 mg/dL (ref 8.4–10.5)
CO2: 25 mEq/L (ref 19–32)
Chloride: 103 mEq/L (ref 96–112)
Creatinine, Ser: 1.4 mg/dL (ref 0.40–1.50)
GFR: 66.41 mL/min (ref >60.00)
GLUCOSE: 88 mg/dL (ref 70–99)
Potassium: 4.2 mEq/L (ref 3.5–5.1)
SODIUM: 136 meq/L (ref 135–145)

## 2017-10-09 LAB — MAGNESIUM: MAGNESIUM: 2.3 mg/dL (ref 1.5–2.5)

## 2017-10-09 LAB — VITAMIN D, 25-OH,TOTAL,IA(REFL): Vit D, 25-Hydroxy: 21 ng/mL — ABNORMAL LOW (ref 30–100)

## 2017-11-04 ENCOUNTER — Telehealth: Payer: Self-pay | Admitting: Internal Medicine

## 2017-11-04 NOTE — Telephone Encounter (Signed)
Copied from [place]. Topic: Quick Communication - Rx Refill/Question >> Nov 04, 2017  4:37 PM Arletha Grippe wrote: Medication: HYDROcodone-acetaminophen ([place]) 10-325 MG tablet   Has the patient contacted their pharmacy? No.   (Agent: If no, request that the patient contact the pharmacy for the refill.)   Preferred Pharmacy (with phone number or street name): [facility] [address]  - or call [phone]    Agent: Please be advised that RX refills may take up to 3 business days. We ask that you follow-up with your pharmacy.

## 2017-11-05 NOTE — Telephone Encounter (Signed)
[facility]co refill request [facility] [address], [place], [place]

## 2017-11-07 ENCOUNTER — Other Ambulatory Visit: Payer: Self-pay | Admitting: Internal Medicine

## 2017-11-07 NOTE — Telephone Encounter (Signed)
Pt would like to ask Dr. [name] if he can approve this script? Pt is completely out and in pain, only takes this medication as needed not often. If approved [facility] [address] and call pt.

## 2017-11-07 NOTE — Telephone Encounter (Signed)
Last OV: 10/08/17 w/ Dr. [name], 04/14/17 w/ Dr. Raliegh Ip Last filled: 04/14/17, #60, 0 RF Sig: TAKE 1 TABLET BY MOUTH EVERY 6 HOURS AS NEEDED UDS: N/A

## 2017-11-07 NOTE — Telephone Encounter (Signed)
Pt called back in again to follow up on refill request. Pt says that his back is hurting really bad and need medication as soon as possible.

## 2017-11-07 NOTE — Telephone Encounter (Signed)
Pt calling to find out what is taking so long and why medication has not been filled - please call pt back at  [phone]

## 2017-11-10 MED ORDER — HYDROCODONE-ACETAMINOPHEN 10-325 MG PO TABS: 1.0000 | ORAL_TABLET | Freq: Four times a day (QID) | ORAL | 0 refills | Status: DC | PRN

## 2017-11-10 NOTE — Telephone Encounter (Signed)
Patient notified that rx is ready for pick up

## 2017-11-10 NOTE — Telephone Encounter (Signed)
Okay to refill #60.  Last prescription was refilled in July 2018

## 2017-11-10 NOTE — Addendum Note (Signed)
Addended by: Westley Hummer B on: 11/10/2017 08:06 AM   Modules accepted: Orders

## 2017-11-11 NOTE — Telephone Encounter (Signed)
Medication filled to pharmacy as requested. Patient scheduled for follow-up appointment.

## 2017-11-12 NOTE — Telephone Encounter (Signed)
Patient states he took his HYDROcodone-acetaminophen ([place]) 10-325 MG tablet and they are charging him $43 because it has not been refilled in atleast 4 months. Pharmacy told patient that his provider would need to send a message to [place] them that this medication is not new, and then they will cover it. [employer] CB# [phone]  patient cb # [phone]

## 2017-11-13 NOTE — Telephone Encounter (Signed)
Prior auth sent to [email or web address].  Approval given:  [id number];Review Type:Prior Auth;Coverage Start Date:10/14/2017;Coverage End Date:11/13/2018 and I called [facility] [address] and informed Zenia Resides of this.

## 2017-12-01 ENCOUNTER — Encounter: Payer: Self-pay | Admitting: Internal Medicine

## 2017-12-01 ENCOUNTER — Ambulatory Visit (INDEPENDENT_AMBULATORY_CARE_PROVIDER_SITE_OTHER): Payer: BLUE CROSS/BLUE SHIELD | Admitting: Internal Medicine

## 2017-12-01 VITALS — BP 132/82 | HR 71 | Temp 98.1°F | Wt 228.0 lb

## 2017-12-01 DIAGNOSIS — I10 Essential (primary) hypertension: Secondary | ICD-10-CM

## 2017-12-01 DIAGNOSIS — M549 Dorsalgia, unspecified: Secondary | ICD-10-CM

## 2017-12-01 NOTE — Patient Instructions (Addendum)
Most patients with low back pain will improve with time over the next two to 6 weeks.  Keep active but avoid any activities that cause pain.  Apply moist heat to the low back area several times daily.   Back Pain, Adult Back pain is very common. The pain often gets better over time. The cause of back pain is usually not dangerous. Most people can learn to manage their back pain on their own. Follow these instructions at home: Watch your back pain for any changes. The following actions may help to lessen any pain you are feeling:  Stay active. Start with short walks on flat ground if you can. Try to walk farther each day.  Exercise regularly as told by your doctor. Exercise helps your back heal faster. It also helps avoid future injury by keeping your muscles strong and flexible.  Do not sit, drive, or stand in one place for more than 30 minutes.  Do not stay in bed. Resting more than 1-2 days can slow down your recovery.  Be careful when you bend or lift an object. Use good form when lifting: ? Bend at your knees. ? Keep the object close to your body. ? Do not twist.  Sleep on a firm mattress. Lie on your side, and bend your knees. If you lie on your back, put a pillow under your knees.  Take medicines only as told by your doctor.  Put ice on the injured area. ? Put ice in a plastic bag. ? Place a towel between your skin and the bag. ? Leave the ice on for 20 minutes, 2-3 times a day for the first 2-3 days. After that, you can switch between ice and heat packs.  Avoid feeling anxious or stressed. Find good ways to deal with stress, such as exercise.  Maintain a healthy weight. Extra weight puts stress on your back.  Contact a doctor if:  You have pain that does not go away with rest or medicine.  You have worsening pain that goes down into your legs or buttocks.  You have pain that does not get better in one week.  You have pain at night.  You lose weight.  You have a  fever or chills. Get help right away if:  You cannot control when you poop (bowel movement) or pee (urinate).  Your arms or legs feel weak.  Your arms or legs lose feeling (numbness).  You feel sick to your stomach (nauseous) or throw up (vomit).  You have belly (abdominal) pain.  You feel like you may pass out (faint). This information is not intended to replace advice given to you by your health care provider. Make sure you discuss any questions you have with your health care provider. Document Released: 02/26/2008 Document Revised: 02/15/2016 Document Reviewed: 01/11/2014 Elsevier Interactive Patient Education  [employer].

## 2017-12-01 NOTE — Progress Notes (Signed)
Subjective:    Patient ID: [name], male    DOB: [birth year], 62 y.o.   MRN: [id number]  HPI  62 year old patient who has a history of essential hypertension.  He states that he does have intermittent low back pain.  He had a flare approximately 2 weeks ago when he was at work doing some lifting.  The low back pain has improved but presently is having bilateral leg achiness over the past 2 or 3 days.  Pain worsened earlier today while at work.  Pain is aggravated by movement and walking.  He has been using Motrin with some benefit.  Bilateral leg pain is described as a achiness.  No nocturnal symptoms or other constitutional complaints.  Past Medical History:  Diagnosis Date  . Epididymitis   . GERD (gastroesophageal reflux disease)   . Gout of big toe 05/21/2013  . Hard of hearing   . History of gout   . Hx of adenomatous polyp of colon 02/01/2017  . Hypertension   . Orchitis   . Sepsis [facility]) hospitalized 12/25/2015     Social History   Socioeconomic History  . Marital status: Legally Separated    Spouse name: Not on file  . Number of children: 3  . Years of education: Not on file  . Highest education level: Not on file  Social Needs  . Financial resource strain: Not on file  . Food insecurity - worry: Not on file  . Food insecurity - inability: Not on file  . Transportation needs - medical: Not on file  . Transportation needs - non-medical: Not on file  Occupational History  . Occupation: driver  Tobacco Use  . Smoking status: Never Smoker  . Smokeless tobacco: Never Used  Substance and Sexual Activity  . Alcohol use: Yes    Alcohol/week: 3.6 oz    Types: 6 Glasses of wine per week  . Drug use: No  . Sexual activity: Not Currently  Other Topics Concern  . Not on file  Social History Narrative   Marital status is separated   Employed as a delivery driver   One son to daughters born in the 1980s, he has one grandchild and 1 great grandchild   12/25/2016     Past Surgical History:  Procedure Laterality Date  . CYSTOSCOPY WITH DIRECT VISION INTERNAL URETHROTOMY N/A 01/25/2016   Procedure:  DIRECT VISION INTERNAL URETHROTOMY;  Surgeon: Ardis Hughs, MD;  Location: [facility];  Service: Urology;  Laterality: N/A;  . CYSTOSCOPY WITH RETROGRADE URETHROGRAM N/A 01/25/2016   Procedure:  RETROGRADE URETHROGRAM;  Surgeon: Ardis Hughs, MD;  Location: [facility];  Service: Urology;  Laterality: N/A;  . INGUINAL HERNIA REPAIR Right   . WISDOM TOOTH EXTRACTION      Family History  Problem Relation Age of Onset  . Diabetes Mellitus II Paternal Grandfather   . Throat cancer Sister   . Colon cancer Neg Hx   . Esophageal cancer Neg Hx   . Pancreatic cancer Neg Hx   . Prostate cancer Neg Hx   . Rectal cancer Neg Hx   . Stomach cancer Neg Hx     Allergies  Allergen Reactions  . Lisinopril Cough    Current Outpatient Medications on File Prior to Visit  Medication Sig Dispense Refill  . amLODipine (NORVASC) 5 MG tablet Take 1 tablet (5 mg total) by mouth daily. 90 tablet 0  . colchicine 0.6 MG tablet Take 1 tablet (0.6 mg total) by mouth  2 (two) times daily. 20 tablet 0  . diclofenac sodium (VOLTAREN) 1 % GEL Apply 2 g topically 4 (four) times daily. 100 g 2  . HYDROcodone-acetaminophen (NORCO) 10-325 MG tablet Take 1 tablet by mouth every 6 (six) hours as needed. 60 tablet 0  . ibuprofen (ADVIL,MOTRIN) 600 MG tablet Take 1 tablet (600 mg total) by mouth every 6 (six) hours as needed for headache. 30 tablet 0  . losartan (COZAAR) 100 MG tablet TAKE 1 TABLET BY MOUTH EVERY DAY 90 tablet 1  . Multiple Vitamins-Minerals (MENS 50+ MULTI VITAMIN/MIN PO) Take 1 tablet by mouth daily.    . naproxen (NAPROSYN) 500 MG tablet TAKE 1 TABLET BY MOUTH TWICE A DAY 30 tablet 0   No current facility-administered medications on file prior to visit.     BP 132/82 (BP Location: Right Arm, Patient Position: Sitting, Cuff Size: Normal)   Pulse 71   Temp 98.1 F  (36.7 C) (Oral)   Wt 228 lb (103.4 kg)   BMI 33.67 kg/m     Review of Systems  Constitutional: Negative for appetite change, chills, fatigue and fever.  HENT: Negative for congestion, dental problem, ear pain, hearing loss, sore throat, tinnitus, trouble swallowing and voice change.   Eyes: Negative for pain, discharge and visual disturbance.  Respiratory: Negative for cough, chest tightness, wheezing and stridor.   Cardiovascular: Negative for chest pain, palpitations and leg swelling.  Gastrointestinal: Negative for abdominal distention, abdominal pain, blood in stool, constipation, diarrhea, nausea and vomiting.  Genitourinary: Negative for difficulty urinating, discharge, flank pain, genital sores, hematuria and urgency.  Musculoskeletal: Positive for back pain. Negative for arthralgias, gait problem, joint swelling, myalgias and neck stiffness.  Skin: Negative for rash.  Neurological: Negative for dizziness, syncope, speech difficulty, weakness, numbness and headaches.  Hematological: Negative for adenopathy. Does not bruise/bleed easily.  Psychiatric/Behavioral: Negative for behavioral problems and dysphoric mood. The patient is not nervous/anxious.        Objective:   Physical Exam  Constitutional: He is oriented to person, place, and time. He appears well-developed.  Able to easily stand from a sitting position and transferred to the examining table Blood pressure 130/80  HENT:  Head: Normocephalic.  Right Ear: External ear normal.  Left Ear: External ear normal.  Eyes: Conjunctivae and EOM are normal.  Cardiovascular: Normal rate and normal heart sounds.  Pulmonary/Chest: Breath sounds normal.  Abdominal: Bowel sounds are normal.  Musculoskeletal: Normal range of motion. He exhibits no edema or tenderness.  Negative straight leg test Some discomfort in the lumbar spine when lowering the legs to the supine position  Lower back musculature slightly tight and  tense  No motor weakness Reflexes equal  Neurological: He is alert and oriented to person, place, and time.  Psychiatric: He has a normal mood and affect. His behavior is normal.          Assessment & Plan:  Low back pain. We will treat with Depo-Medrol 80.  Patient instructions dispensed will minimize activities and continue Motrin as needed ; will report any new or worsening symptoms or he if he fails to improve Essential hypertension stable  Nyoka Cowden

## 2017-12-09 ENCOUNTER — Other Ambulatory Visit: Payer: Self-pay | Admitting: Internal Medicine

## 2018-01-14 ENCOUNTER — Telehealth: Payer: Self-pay | Admitting: Internal Medicine

## 2018-01-14 NOTE — Telephone Encounter (Signed)
Would you like for me to send pantoprozole omeprozole?  Please advise

## 2018-01-14 NOTE — Telephone Encounter (Signed)
Pantoprazole 40 mg #31 daily as needed for dyspepsia

## 2018-01-14 NOTE — Telephone Encounter (Signed)
Copied from [place] [id number]. Topic: Quick Communication - See Telephone Encounter >> Jan 14, 2018 11:42 AM Ahmed Prima L wrote: CRM for notification. See Telephone encounter for: 01/14/18. Patient is requesting for something to take for acid reflux. He said that he went to urgent care a couple months ago for this & they gave him something. He said he does not know the name of it because he doesn't have the bottle. He said he has tried over the counter zantac and it is not helping. He is having bad episodes after he eats. Please advise. Call back is [phone]

## 2018-01-15 ENCOUNTER — Other Ambulatory Visit: Payer: Self-pay

## 2018-01-15 MED ORDER — PANTOPRAZOLE SODIUM 40 MG PO TBEC: 40.0000 mg | DELAYED_RELEASE_TABLET | Freq: Every day | ORAL | 3 refills | Status: DC

## 2018-01-15 NOTE — Telephone Encounter (Signed)
Medication sent to [facility] patient notified and want all other prescription from now on to be [facility] into [facility]

## 2018-02-04 ENCOUNTER — Other Ambulatory Visit: Payer: Self-pay

## 2018-02-04 ENCOUNTER — Telehealth: Payer: Self-pay | Admitting: Internal Medicine

## 2018-02-04 MED ORDER — COLCHICINE 0.6 MG PO TABS: 0.6000 mg | ORAL_TABLET | Freq: Two times a day (BID) | ORAL | 0 refills | Status: DC

## 2018-02-04 NOTE — Telephone Encounter (Signed)
Patient is calling to report he is having the start of a gout flare- L great toe- pain started 2 days ago- patient is using elevation, taking Advil, and vinegar. Patient is working today. He does not want it to get as bad as the last time and he is requesting medication for gout flare. Told patient I would send the request. Patient also wants Dr Kennieth Rad to know he is disappointed he has to find another provider.

## 2018-02-04 NOTE — Telephone Encounter (Signed)
Left message to return call 

## 2018-02-04 NOTE — Telephone Encounter (Signed)
Copied from [place] [phone]. Topic: Inquiry >> Feb 04, 2018 11:49 AM [name] wrote: Reason for CRM: pt called Thompson/c he feels he is having a re-occurring episode of gout in his foot, pt is reaching out to see if pcp could call in a medication for it, call pt to advise

## 2018-02-04 NOTE — Telephone Encounter (Signed)
Medication sent to [facility]. [facility] but I do not have the location.

## 2018-02-17 ENCOUNTER — Other Ambulatory Visit: Payer: Self-pay | Admitting: Internal Medicine

## 2018-03-10 ENCOUNTER — Telehealth: Payer: Self-pay | Admitting: Internal Medicine

## 2018-03-10 ENCOUNTER — Other Ambulatory Visit: Payer: Self-pay

## 2018-03-10 NOTE — Telephone Encounter (Signed)
Per pharmacy pt's medication is not part of the recall. Pt advised. Nothing further needed.

## 2018-03-10 NOTE — Telephone Encounter (Signed)
Dr. Raliegh Ip - Are you able to eScribe this? Thanks!

## 2018-03-10 NOTE — Telephone Encounter (Signed)
Copied from [place] [phone] [id number]. Topic: Quick Communication - See Telephone Encounter >> Mar 10, 2018  4:10 PM Valla Leaver wrote: CRM for notification. See Telephone encounter for: 03/10/18. Patient would like a call back to discuss losartan (COZAAR) 100 MG tablet being recalled because it's causing cancer and stomach pain? He has been having stomach pain and wants to know if he should stop the medication. Patient declined to call his pharmacy and is very concerned.

## 2018-03-10 NOTE — Telephone Encounter (Signed)
Okay #60.  Patient will require follow-up office visit prior to any more refills

## 2018-03-11 ENCOUNTER — Other Ambulatory Visit: Payer: Self-pay | Admitting: Internal Medicine

## 2018-03-11 MED ORDER — HYDROCODONE-ACETAMINOPHEN 10-325 MG PO TABS: 1.0000 | ORAL_TABLET | Freq: Four times a day (QID) | ORAL | 0 refills | Status: DC | PRN

## 2018-05-18 ENCOUNTER — Encounter: Payer: Self-pay | Admitting: Internal Medicine

## 2018-05-18 ENCOUNTER — Ambulatory Visit (INDEPENDENT_AMBULATORY_CARE_PROVIDER_SITE_OTHER): Payer: BLUE CROSS/BLUE SHIELD | Admitting: Internal Medicine

## 2018-05-18 VITALS — BP 100/60 | HR 82 | Wt 221.2 lb

## 2018-05-18 DIAGNOSIS — R490 Dysphonia: Secondary | ICD-10-CM

## 2018-05-18 DIAGNOSIS — I10 Essential (primary) hypertension: Secondary | ICD-10-CM

## 2018-05-18 MED ORDER — LOSARTAN POTASSIUM 100 MG PO TABS: 100.0000 mg | ORAL_TABLET | Freq: Every day | ORAL | 3 refills | Status: DC

## 2018-05-18 MED ORDER — COLCHICINE 0.6 MG PO TABS: 0.6000 mg | ORAL_TABLET | Freq: Two times a day (BID) | ORAL | 3 refills | Status: DC

## 2018-05-18 MED ORDER — HYDROCODONE-ACETAMINOPHEN 10-325 MG PO TABS: 1.0000 | ORAL_TABLET | Freq: Four times a day (QID) | ORAL | 0 refills | Status: DC | PRN

## 2018-05-18 MED ORDER — AMLODIPINE BESYLATE 5 MG PO TABS: 5.0000 mg | ORAL_TABLET | Freq: Every day | ORAL | 3 refills | Status: DC

## 2018-05-18 MED ORDER — PANTOPRAZOLE SODIUM 40 MG PO TBEC: 40.0000 mg | DELAYED_RELEASE_TABLET | Freq: Every day | ORAL | 3 refills | Status: DC

## 2018-05-18 NOTE — Patient Instructions (Signed)
Hoarseness Hoarseness is any abnormal change in your voice.Hoarseness can make it difficult to speak. Your voice may sound raspy, breathy, or strained. Hoarseness is caused by a problem with the vocal cords. The vocal cords are two bands of tissue inside your voice box (larynx). When you speak, your vocal cords move back and forth to create sound. The surfaces of your vocal cords need to be smooth for your voice to sound clear. Swelling or lumps on the vocal cords can cause hoarseness. Common causes of vocal cord problems include:  Upper airway infection.  A long-term cough.  Straining or overusing your voice.  Smoking.  Allergies.  Vocal cord growths.  Stomach acids that flow up from your stomach and irritate your vocal cords (gastroesophageal reflux).  Follow these instructions at home: Watch your condition for any changes. To ease any discomfort that you feel:  Rest your voice. Do not whisper. Whispering can cause muscle strain.  Do not speak in a loud or harsh voice that makes your hoarseness worse.  Do not use any tobacco products, including cigarettes, chewing tobacco, or electronic cigarettes. If you need help quitting, ask your health care provider.  Avoid secondhand smoke.  Do not eat foods that give you heartburn. Heartburn can make gastroesophageal reflux worse.  Do not drink coffee.  Do not drink alcohol.  Drink enough fluids to keep your urine clear or pale yellow.  Use a humidifier if the air in your home is dry.  Contact a health care provider if:  You have hoarseness that lasts longer than 3 weeks.  You almost lose or completelylose your voice for longer than 3 days.  You have pain when you swallow or try to talk.  You feel a lump in your neck. Get help right away if:  You have trouble swallowing.  You feel as though you are choking when you swallow.  You cough up blood or vomit blood.  You have trouble breathing. This information is not  intended to replace advice given to you by your health care provider. Make sure you discuss any questions you have with your health care provider. Document Released: 08/23/2005 Document Revised: 02/15/2016 Document Reviewed: 08/31/2014 Elsevier Interactive Patient Education  2018 Else[employer] Inc.  

## 2018-05-18 NOTE — Progress Notes (Signed)
Subjective:    Patient ID: [name], male    DOB: 1956-05-14, 62 y.o.   MRN: [id number]  HPI  62 year old patient has a history of essential hypertension. He presents with a 25-month history of chronic hoarseness.  Symptoms do wax and wane a bit.  He does have a history of GERD and discontinued Protonix about 2 months ago.  He is a lifelong non-smoker. He denies any significant reflux symptoms throat clearing or chronic cough. 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  Past Medical History:  Diagnosis Date  . Epididymitis   . GERD (gastroesophageal reflux disease)   . Gout of big toe 05/21/2013  . Hard of hearing   . History of gout   . Hx of adenomatous polyp of colon 02/01/2017  . Hypertension   . Orchitis   . Sepsis [facility]) hospitalized 12/25/2015     Social History   Socioeconomic History  . Marital status: Legally Separated    Spouse name: Not on file  . Number of children: 3  . Years of education: Not on file  . Highest education level: Not on file  Occupational History  . Occupation: driver  Social Needs  . Financial resource strain: Not on file  . Food insecurity:    Worry: Not on file    Inability: Not on file  . Transportation needs:    Medical: Not on file    Non-medical: Not on file  Tobacco Use  . Smoking status: Never Smoker  . Smokeless tobacco: Never Used  Substance and Sexual Activity  . Alcohol use: Yes    Alcohol/week: 6.0 standard drinks    Types: 6 Glasses of wine per week  . Drug use: No  . Sexual activity: Not Currently  Lifestyle  . Physical activity:    Days per week: Not on file    Minutes per session: Not on file  . Stress: Not on file  Relationships  . Social connections:    Talks on phone: Not on file    Gets together: Not on file    Attends religious service: Not on file    Active member of club or organization: Not on file    Attends meetings of clubs or  organizations: Not on file    Relationship status: Not on file  . Intimate partner violence:    Fear of current or ex partner: Not on file    Emotionally abused: Not on file    Physically abused: Not on file    Forced sexual activity: Not on file  Other Topics Concern  . Not on file  Social History Narrative   Marital status is separated   Employed as a delivery driver   One son to daughters born in the 1980s, he has one grandchild and 1 great grandchild   12/25/2016    Past Surgical History:  Procedure Laterality Date  . CYSTOSCOPY WITH DIRECT VISION INTERNAL URETHROTOMY N/A 01/25/2016   Procedure:  DIRECT VISION INTERNAL URETHROTOMY;  Surgeon: Ardis Hughs, MD;  Location: [facility];  Service: Urology;  Laterality: N/A;  . CYSTOSCOPY WITH RETROGRADE URETHROGRAM N/A 01/25/2016   Procedure:  RETROGRADE URETHROGRAM;  Surgeon: Ardis Hughs, MD;  Location: [facility];  Service: Urology;  Laterality: N/A;  . INGUINAL HERNIA REPAIR Right   . WISDOM TOOTH EXTRACTION      Family History  Problem Relation Age of Onset  .  Diabetes Mellitus II Paternal Grandfather   . Throat cancer Sister   . Colon cancer Neg Hx   . Esophageal cancer Neg Hx   . Pancreatic cancer Neg Hx   . Prostate cancer Neg Hx   . Rectal cancer Neg Hx   . Stomach cancer Neg Hx     Allergies  Allergen Reactions  . Lisinopril Cough    Current Outpatient Medications on File Prior to Visit  Medication Sig Dispense Refill  . amLODipine (NORVASC) 5 MG tablet TAKE 1 TABLET BY MOUTH EVERY DAY 90 tablet 0  . colchicine 0.6 MG tablet Take 1 tablet (0.6 mg total) by mouth 2 (two) times daily. 20 tablet 0  . diclofenac sodium (VOLTAREN) 1 % GEL Apply 2 g topically 4 (four) times daily. 100 g 2  . HYDROcodone-acetaminophen (NORCO) 10-325 MG tablet Take 1 tablet by mouth every 6 (six) hours as needed. 60 tablet 0  . ibuprofen (ADVIL,MOTRIN) 600 MG tablet Take 1 tablet (600 mg total) by mouth every 6 (six) hours as needed for  headache. 30 tablet 0  . losartan (COZAAR) 100 MG tablet TAKE 1 TABLET BY MOUTH EVERY DAY 90 tablet 1  . Multiple Vitamins-Minerals (MENS 50+ MULTI VITAMIN/MIN PO) Take 1 tablet by mouth daily.    . naproxen (NAPROSYN) 500 MG tablet TAKE 1 TABLET BY MOUTH TWICE A DAY 30 tablet 0  . pantoprazole (PROTONIX) 40 MG tablet Take 1 tablet (40 mg total) by mouth daily. 30 tablet 3   No current facility-administered medications on file prior to visit.     BP 100/60 (BP Location: Left Arm, Patient Position: Sitting, Cuff Size: Large)   Pulse 82   Wt 221 lb 3.2 oz (100.3 kg)   SpO2 97%   BMI 32.67 kg/m     Review of Systems  Constitutional: Negative for appetite change, chills, fatigue and fever.  HENT: Positive for voice change. Negative for congestion, dental problem, ear pain, hearing loss, sore throat, tinnitus and trouble swallowing.   Eyes: Negative for pain, discharge and visual disturbance.  Respiratory: Negative for cough, chest tightness, wheezing and stridor.   Cardiovascular: Negative for chest pain, palpitations and leg swelling.  Gastrointestinal: Negative for abdominal distention, abdominal pain, blood in stool, constipation, diarrhea, nausea and vomiting.  Genitourinary: Negative for difficulty urinating, discharge, flank pain, genital sores, hematuria and urgency.  Musculoskeletal: Negative for arthralgias, back pain, gait problem, joint swelling, myalgias and neck stiffness.  Skin: Negative for rash.  Neurological: Negative for dizziness, syncope, speech difficulty, weakness, numbness and headaches.  Hematological: Negative for adenopathy. Does not bruise/bleed easily.  Psychiatric/Behavioral: Negative for behavioral problems and dysphoric mood. The patient is not nervous/anxious.        Objective:   Physical Exam  Constitutional: He is oriented to person, place, and time. He appears well-developed.  HENT:  Head: Normocephalic.  Right Ear: External ear normal.  Left  Ear: External ear normal.  Pharyngeal crowding but no abnormalities appreciated  Eyes: Conjunctivae and EOM are normal.  Neck: Normal range of motion. Neck supple.  Cardiovascular: Normal rate and normal heart sounds.  Pulmonary/Chest: Breath sounds normal.  Abdominal: Bowel sounds are normal.  Musculoskeletal: Normal range of motion. He exhibits no edema or tenderness.  Lymphadenopathy:    He has no cervical adenopathy.  Neurological: He is alert and oriented to person, place, and time.  Psychiatric: He has a normal mood and affect. His behavior is normal.  Assessment & Plan:   Chronic hoarseness now of 2 months duration History of GERD.  Off PPI therapy for 2 months Essential hypertension  Probable laryngal pharyngeal reflux although very little symptoms of reflux throat clearing postnasal drip etc. will restart PPI therapy.  If symptoms do not resolve promptly will refer to ENT  Marletta Lor

## 2018-05-28 ENCOUNTER — Other Ambulatory Visit: Payer: Self-pay

## 2018-05-28 MED ORDER — COLCHICINE 0.6 MG PO TABS: 0.6000 mg | ORAL_TABLET | Freq: Two times a day (BID) | ORAL | 2 refills | Status: DC

## 2018-06-03 ENCOUNTER — Other Ambulatory Visit: Payer: Self-pay | Admitting: Internal Medicine

## 2018-08-18 ENCOUNTER — Other Ambulatory Visit: Payer: Self-pay | Admitting: Internal Medicine

## 2018-12-09 ENCOUNTER — Ambulatory Visit (INDEPENDENT_AMBULATORY_CARE_PROVIDER_SITE_OTHER): Payer: BLUE CROSS/BLUE SHIELD | Admitting: Internal Medicine

## 2018-12-09 ENCOUNTER — Ambulatory Visit: Payer: Self-pay

## 2018-12-09 ENCOUNTER — Other Ambulatory Visit: Payer: Self-pay

## 2018-12-09 ENCOUNTER — Encounter: Payer: Self-pay | Admitting: Internal Medicine

## 2018-12-09 VITALS — BP 122/80 | HR 81 | Temp 98.2°F | Ht 69.0 in | Wt 228.4 lb

## 2018-12-09 DIAGNOSIS — R42 Dizziness and giddiness: Secondary | ICD-10-CM | POA: Diagnosis not present

## 2018-12-09 DIAGNOSIS — I10 Essential (primary) hypertension: Secondary | ICD-10-CM

## 2018-12-09 DIAGNOSIS — R252 Cramp and spasm: Secondary | ICD-10-CM | POA: Diagnosis not present

## 2018-12-09 LAB — POCT URINALYSIS DIPSTICK
BILIRUBIN UA: NEGATIVE
GLUCOSE UA: NEGATIVE
Ketones, UA: NEGATIVE
Nitrite, UA: POSITIVE
PH UA: 5.5 (ref 5.0–8.0)
Protein, UA: NEGATIVE
RBC UA: NEGATIVE
Spec Grav, UA: 1.025 (ref 1.010–1.025)
Urobilinogen, UA: 0.2 E.U./dL

## 2018-12-09 NOTE — Telephone Encounter (Signed)
Incoming call from  Patient with complaint of feeling light headed and dizzy. Reports cold sweats. Rates the dizziness as moderate.   States, he feels a head rush, eyesight becomes blurry.  Blood pressure was, 153/100 on Saturday.  150/ 93 Sunday Reports, getting cramps  Often.  Reports upset stomach  at times.  Onset of dizziness over a month,  Current heart rate, 84. Denies chest pain, vomiting,and fever.  Patient scheduled for appointment today, 12/09/18, @, 4:15,Patient voiced understanding.   Reason for Disposition . [1] MODERATE dizziness (e.g., interferes with normal activities) AND [2] has NOT been evaluated by physician for this  (Exception: dizziness caused by heat exposure, sudden standing, or poor fluid intake)  Answer Assessment - Initial Assessment Questions 1. DESCRIPTION: "Describe your dizziness."     Head rush, blurry sight light headed 2. LIGHTHEADED: "Do you feel lightheaded?" (e.g., somewhat faint, woozy, weak upon standing)     *No Answer* 3. VERTIGO: "Do you feel like either you or the room is spinning or tilting?" (i.e. vertigo)     Denies bad cramps in my thigh. Cold sweat.  Cramps Saturday 4. SEVERITY: "How bad is it?"  "Do you feel like you are going to faint?" "Can you stand and walk?"   - MILD - walking normally   - MODERATE - interferes with normal activities (e.g., work, school)    - SEVERE - unable to stand, requires support to walk, feels like passing out now.      Mild to moderate 5. ONSET:  "When did the dizziness begin?"     Off and on over a month 6. AGGRAVATING FACTORS: "Does anything make it worse?" (e.g., standing, change in head position)     Denies but cramps 7. HEART RATE: "Can you tell me your heart rate?" "How many beats in 15 seconds?"  (Note: not all patients can do this)       84  8. CAUSE: "What do you think is causing the dizziness?"     *No Answer* 9. RECURRENT SYMPTOM: "Have you had dizziness before?" If so, ask: "When was the last time?"  "What happened that time?"     No  Related to b/p 10. OTHER SYMPTOMS: "Do you have any other symptoms?" (e.g., fever, chest pain, vomiting, diarrhea, bleeding)       denies 11. PREGNANCY: "Is there any chance you are pregnant?" "When was your last menstrual period?"      na  Protocols used: [facility]ZZ[facility]

## 2018-12-09 NOTE — Telephone Encounter (Signed)
Noted  

## 2018-12-09 NOTE — Patient Instructions (Addendum)
Great meeting you this afternoon.  -Cont taking your blood pressure medications, norvasc and losartan as directed -We will draw blood work today and notify you of the results   Hypertension Hypertension, commonly called high blood pressure, is when the force of blood pumping through the arteries is too strong. The arteries are the blood vessels that carry blood from the heart throughout the body. Hypertension forces the heart to work harder to pump blood and may cause arteries to become narrow or stiff. Having untreated or uncontrolled hypertension can cause heart attacks, strokes, kidney disease, and other problems. A blood pressure reading consists of a higher number over a lower number. Ideally, your blood pressure should be below 120/80. The first ("top") number is called the systolic pressure. It is a measure of the pressure in your arteries as your heart beats. The second ("bottom") number is called the diastolic pressure. It is a measure of the pressure in your arteries as the heart relaxes. What are the causes? The cause of this condition is not known. What increases the risk? Some risk factors for high blood pressure are under your control. Others are not. Factors you can change  Smoking.  Having type 2 diabetes mellitus, high cholesterol, or both.  Not getting enough exercise or physical activity.  Being overweight.  Having too much fat, sugar, calories, or salt (sodium) in your diet.  Drinking too much alcohol. Factors that are difficult or impossible to change  Having chronic kidney disease.  Having a family history of high blood pressure.  Age. Risk increases with age.  Race. You may be at higher risk if you are African-American.  Gender. Men are at higher risk than women before age 77. After age 2, women are at higher risk than men.  Having obstructive sleep apnea.  Stress. What are the signs or symptoms? Extremely high blood pressure (hypertensive crisis) may  cause:  Headache.  Anxiety.  Shortness of breath.  Nosebleed.  Nausea and vomiting.  Severe chest pain.  Jerky movements you cannot control (seizures). How is this diagnosed? This condition is diagnosed by measuring your blood pressure while you are seated, with your arm resting on a surface. The cuff of the blood pressure monitor will be placed directly against the skin of your upper arm at the level of your heart. It should be measured at least twice using the same arm. Certain conditions can cause a difference in blood pressure between your right and left arms. Certain factors can cause blood pressure readings to be lower or higher than normal (elevated) for a short period of time:  When your blood pressure is higher when you are in a health care provider's office than when you are at home, this is called white coat hypertension. Most people with this condition do not need medicines.  When your blood pressure is higher at home than when you are in a health care provider's office, this is called masked hypertension. Most people with this condition may need medicines to control blood pressure. If you have a high blood pressure reading during one visit or you have normal blood pressure with other risk factors:  You may be asked to return on a different day to have your blood pressure checked again.  You may be asked to monitor your blood pressure at home for 1 week or longer. If you are diagnosed with hypertension, you may have other blood or imaging tests to help your health care provider understand your overall risk for  other conditions. How is this treated? This condition is treated by making healthy lifestyle changes, such as eating healthy foods, exercising more, and reducing your alcohol intake. Your health care provider may prescribe medicine if lifestyle changes are not enough to get your blood pressure under control, and if:  Your systolic blood pressure is above 130.  Your  diastolic blood pressure is above 80. Your personal target blood pressure may vary depending on your medical conditions, your age, and other factors. Follow these instructions at home: Eating and drinking   Eat a diet that is high in fiber and potassium, and low in sodium, added sugar, and fat. An example eating plan is called the DASH (Dietary Approaches to Stop Hypertension) diet. To eat this way: ? Eat plenty of fresh fruits and vegetables. Try to fill half of your plate at each meal with fruits and vegetables. ? Eat whole grains, such as whole wheat pasta, brown rice, or whole grain bread. Fill about one quarter of your plate with whole grains. ? Eat or drink low-fat dairy products, such as skim milk or low-fat yogurt. ? Avoid fatty cuts of meat, processed or cured meats, and poultry with skin. Fill about one quarter of your plate with lean proteins, such as fish, chicken without skin, beans, eggs, and tofu. ? Avoid premade and processed foods. These tend to be higher in sodium, added sugar, and fat.  Reduce your daily sodium intake. Most people with hypertension should eat less than 1,500 mg of sodium a day.  Limit alcohol intake to no more than 1 drink a day for nonpregnant women and 2 drinks a day for men. One drink equals 12 oz of beer, 5 oz of wine, or 1 oz of hard liquor. Lifestyle   Work with your health care provider to maintain a healthy body weight or to lose weight. Ask what an ideal weight is for you.  Get at least 30 minutes of exercise that causes your heart to beat faster (aerobic exercise) most days of the week. Activities may include walking, swimming, or biking.  Include exercise to strengthen your muscles (resistance exercise), such as pilates or lifting weights, as part of your weekly exercise routine. Try to do these types of exercises for 30 minutes at least 3 days a week.  Do not use any products that contain nicotine or tobacco, such as cigarettes and  e-cigarettes. If you need help quitting, ask your health care provider.  Monitor your blood pressure at home as told by your health care provider.  Keep all follow-up visits as told by your health care provider. This is important. Medicines  Take over-the-counter and prescription medicines only as told by your health care provider. Follow directions carefully. Blood pressure medicines must be taken as prescribed.  Do not skip doses of blood pressure medicine. Doing this puts you at risk for problems and can make the medicine less effective.  Ask your health care provider about side effects or reactions to medicines that you should watch for. Contact a health care provider if:  You think you are having a reaction to a medicine you are taking.  You have headaches that keep coming back (recurring).  You feel dizzy.  You have swelling in your ankles.  You have trouble with your vision. Get help right away if:  You develop a severe headache or confusion.  You have unusual weakness or numbness.  You feel faint.  You have severe pain in your chest or abdomen.  You vomit repeatedly.  You have trouble breathing. Summary  Hypertension is when the force of blood pumping through your arteries is too strong. If this condition is not controlled, it may put you at risk for serious complications.  Your personal target blood pressure may vary depending on your medical conditions, your age, and other factors. For most people, a normal blood pressure is less than 120/80.  Hypertension is treated with lifestyle changes, medicines, or a combination of both. Lifestyle changes include weight loss, eating a healthy, low-sodium diet, exercising more, and limiting alcohol. This information is not intended to replace advice given to you by your health care provider. Make sure you discuss any questions you have with your health care provider. Document Released: 09/09/2005 Document Revised: 08/07/2016  Document Reviewed: 08/07/2016 Elsevier Interactive Patient Education  2019 [place] DASH stands for "Dietary Approaches to Stop Hypertension." The DASH eating plan is a healthy eating plan that has been shown to reduce high blood pressure (hypertension). It may also reduce your risk for type 2 diabetes, heart disease, and stroke. The DASH eating plan may also help with weight loss. What are tips for following this plan?  General guidelines  Avoid eating more than 2,300 mg (milligrams) of salt (sodium) a day. If you have hypertension, you may need to reduce your sodium intake to 1,500 mg a day.  Limit alcohol intake to no more than 1 drink a day for nonpregnant women and 2 drinks a day for men. One drink equals 12 oz of beer, 5 oz of wine, or 1 oz of hard liquor.  Work with your health care provider to maintain a healthy body weight or to lose weight. Ask what an ideal weight is for you.  Get at least 30 minutes of exercise that causes your heart to beat faster (aerobic exercise) most days of the week. Activities may include walking, swimming, or biking.  Work with your health care provider or diet and nutrition specialist (dietitian) to adjust your eating plan to your individual calorie needs. Reading food labels   Check food labels for the amount of sodium per serving. Choose foods with less than 5 percent of the Daily Value of sodium. Generally, foods with less than 300 mg of sodium per serving fit into this eating plan.  To find whole grains, look for the word "whole" as the first word in the ingredient list. Shopping  Buy products labeled as "low-sodium" or "no salt added."  Buy fresh foods. Avoid canned foods and premade or frozen meals. Cooking  Avoid adding salt when cooking. Use salt-free seasonings or herbs instead of table salt or sea salt. Check with your health care provider or pharmacist before using salt substitutes.  Do not fry foods. Cook  foods using healthy methods such as baking, boiling, grilling, and broiling instead.  Cook with heart-healthy oils, such as olive, canola, soybean, or sunflower oil. Meal planning  Eat a balanced diet that includes: ? 5 or more servings of fruits and vegetables each day. At each meal, try to fill half of your plate with fruits and vegetables. ? Up to 6-8 servings of whole grains each day. ? Less than 6 oz of lean meat, poultry, or fish each day. A 3-oz serving of meat is about the same size as a deck of cards. One egg equals 1 oz. ? 2 servings of low-fat dairy each day. ? A serving of nuts, seeds, or beans 5 times each week. ?  Heart-healthy fats. Healthy fats called Omega-3 fatty acids are found in foods such as flaxseeds and coldwater fish, like sardines, salmon, and mackerel.  Limit how much you eat of the following: ? Canned or prepackaged foods. ? Food that is high in trans fat, such as fried foods. ? Food that is high in saturated fat, such as fatty meat. ? Sweets, desserts, sugary drinks, and other foods with added sugar. ? Full-fat dairy products.  Do not salt foods before eating.  Try to eat at least 2 vegetarian meals each week.  Eat more home-cooked food and less restaurant, buffet, and fast food.  When eating at a restaurant, ask that your food be prepared with less salt or no salt, if possible. What foods are recommended? The items listed may not be a complete list. Talk with your dietitian about what dietary choices are best for you. Grains Whole-grain or whole-wheat bread. Whole-grain or whole-wheat pasta. Brown rice. [place]. Bulgur. Whole-grain and low-sodium cereals. Pita bread. Low-fat, low-sodium crackers. Whole-wheat flour tortillas. Vegetables Fresh or frozen vegetables (raw, steamed, roasted, or grilled). Low-sodium or reduced-sodium tomato and vegetable juice. Low-sodium or reduced-sodium tomato sauce and tomato paste. Low-sodium or reduced-sodium canned  vegetables. Fruits All fresh, dried, or frozen fruit. Canned fruit in natural juice (without added sugar). Meat and other protein foods Skinless chicken or [place]. Ground chicken or [place]. Pork with fat trimmed off. Fish and seafood. Egg whites. Dried beans, peas, or lentils. Unsalted nuts, nut butters, and seeds. Unsalted canned beans. Lean cuts of beef with fat trimmed off. Low-sodium, lean deli meat. Dairy Low-fat (1%) or fat-free (skim) milk. Fat-free, low-fat, or reduced-fat cheeses. Nonfat, low-sodium ricotta or cottage cheese. Low-fat or nonfat yogurt. Low-fat, low-sodium cheese. Fats and oils Soft margarine without trans fats. Vegetable oil. Low-fat, reduced-fat, or light mayonnaise and salad dressings (reduced-sodium). Canola, safflower, olive, soybean, and sunflower oils. Avocado. Seasoning and other foods Herbs. Spices. Seasoning mixes without salt. Unsalted popcorn and pretzels. Fat-free sweets. What foods are not recommended? The items listed may not be a complete list. Talk with your dietitian about what dietary choices are best for you. Grains Baked goods made with fat, such as croissants, muffins, or some breads. Dry pasta or rice meal packs. Vegetables Creamed or fried vegetables. Vegetables in a cheese sauce. Regular canned vegetables (not low-sodium or reduced-sodium). Regular canned tomato sauce and paste (not low-sodium or reduced-sodium). Regular tomato and vegetable juice (not low-sodium or reduced-sodium). Angie Fava. Olives. Fruits Canned fruit in a light or heavy syrup. Fried fruit. Fruit in cream or butter sauce. Meat and other protein foods Fatty cuts of meat. Ribs. Fried meat. Berniece Salines. Sausage. Bologna and other processed lunch meats. Salami. Fatback. Hotdogs. Bratwurst. Salted nuts and seeds. Canned beans with added salt. Canned or smoked fish. Whole eggs or egg yolks. Chicken or [place] with skin. Dairy Whole or 2% milk, cream, and half-and-half. Whole or full-fat  cream cheese. Whole-fat or sweetened yogurt. Full-fat cheese. Nondairy creamers. Whipped toppings. Processed cheese and cheese spreads. Fats and oils Butter. Stick margarine. Lard. Shortening. Ghee. Bacon fat. Tropical oils, such as coconut, palm kernel, or palm oil. Seasoning and other foods Salted popcorn and pretzels. Onion salt, garlic salt, seasoned salt, table salt, and sea salt. Worcestershire sauce. Tartar sauce. Barbecue sauce. Teriyaki sauce. Soy sauce, including reduced-sodium. Steak sauce. Canned and packaged gravies. Fish sauce. Oyster sauce. Cocktail sauce. Horseradish that you find on the shelf. Ketchup. Mustard. Meat flavorings and tenderizers. Bouillon cubes. Hot sauce and Tabasco sauce.  Premade or packaged marinades. Premade or packaged taco seasonings. Relishes. Regular salad dressings. Where to find more information:  National Heart, Lung, and [place]: https://wilson-eaton.com/  American Heart Association: www.heart.org Summary  The DASH eating plan is a healthy eating plan that has been shown to reduce high blood pressure (hypertension). It may also reduce your risk for type 2 diabetes, heart disease, and stroke.  With the DASH eating plan, you should limit salt (sodium) intake to 2,300 mg a day. If you have hypertension, you may need to reduce your sodium intake to 1,500 mg a day.  When on the DASH eating plan, aim to eat more fresh fruits and vegetables, whole grains, lean proteins, low-fat dairy, and heart-healthy fats.  Work with your health care provider or diet and nutrition specialist (dietitian) to adjust your eating plan to your individual calorie needs. This information is not intended to replace advice given to you by your health care provider. Make sure you discuss any questions you have with your health care provider. Document Released: 08/29/2011 Document Revised: 09/02/2016 Document Reviewed: 09/02/2016 Elsevier Interactive Patient Education  2019 [place]

## 2018-12-09 NOTE — Progress Notes (Signed)
Established Patient Office Visit     CC/Reason for Visit: elevated blood pressure, dizziness and cramps  HPI: [name] is a 63 y.o. male who is coming in today for the above mentioned reasons. Past Medical History is significant for hypertension and GERD.  Patient stopped taking his bp medications several weeks ago, because he didn't think he needed it.  He started back taking the meds this past Friday since he was not feeling well.  He said he's been feeling bad off and on for about a month.  Saturday he said he felt dizzy, had cramps in his upper legs.  He went to a drug store and took his bp and got 153/95.  No fever, cough.   Past Medical/Surgical History: Past Medical History:  Diagnosis Date   Epididymitis    GERD (gastroesophageal reflux disease)    Gout of big toe 05/21/2013   Hard of hearing    History of gout    Hx of adenomatous polyp of colon 02/01/2017   Hypertension    Orchitis    Sepsis ([place]) hospitalized 12/25/2015    Past Surgical History:  Procedure Laterality Date   CYSTOSCOPY WITH DIRECT VISION INTERNAL URETHROTOMY N/A 01/25/2016   Procedure:  DIRECT VISION INTERNAL URETHROTOMY;  Surgeon: Ardis Hughs, MD;  Location: [facility];  Service: Urology;  Laterality: N/A;   CYSTOSCOPY WITH RETROGRADE URETHROGRAM N/A 01/25/2016   Procedure:  RETROGRADE URETHROGRAM;  Surgeon: Ardis Hughs, MD;  Location: [facility];  Service: Urology;  Laterality: N/A;   INGUINAL HERNIA REPAIR Right    WISDOM TOOTH EXTRACTION      Social History:  reports that he has never smoked. He has never used smokeless tobacco. He reports current alcohol use of about 6.0 standard drinks of alcohol per week. He reports that he does not use drugs.  Allergies: Allergies  Allergen Reactions   Lisinopril Cough    Family History:  Family History  Problem Relation Age of Onset   Diabetes Mellitus II Paternal Grandfather    Throat cancer Sister    Colon cancer Neg Hx     Esophageal cancer Neg Hx    Pancreatic cancer Neg Hx    Prostate cancer Neg Hx    Rectal cancer Neg Hx    Stomach cancer Neg Hx      Current Outpatient Medications:    amLODipine (NORVASC) 5 MG tablet, TAKE 1 TABLET BY MOUTH ONCE DAILY, Disp: 90 tablet, Rfl: 0   colchicine (COLCRYS) 0.6 MG tablet, Take 1 tablet (0.6 mg total) by mouth 2 (two) times daily., Disp: 20 tablet, Rfl: 2   diclofenac sodium (VOLTAREN) 1 % GEL, Apply 2 g topically 4 (four) times daily., Disp: 100 g, Rfl: 2   HYDROcodone-acetaminophen (NORCO) 10-325 MG tablet, Take 1 tablet by mouth every 6 (six) hours as needed., Disp: 60 tablet, Rfl: 0   ibuprofen (ADVIL,MOTRIN) 600 MG tablet, Take 1 tablet (600 mg total) by mouth every 6 (six) hours as needed for headache., Disp: 30 tablet, Rfl: 0   losartan (COZAAR) 100 MG tablet, Take 1 tablet (100 mg total) by mouth daily., Disp: 90 tablet, Rfl: 3   Multiple Vitamins-Minerals (MENS 50+ MULTI VITAMIN/MIN PO), Take 1 tablet by mouth daily., Disp: , Rfl:    naproxen (NAPROSYN) 500 MG tablet, TAKE 1 TABLET BY MOUTH TWICE A DAY, Disp: 30 tablet, Rfl: 0   pantoprazole (PROTONIX) 40 MG tablet, Take 1 tablet (40 mg total) by mouth daily., Disp:  90 tablet, Rfl: 3  Review of Systems:  Constitutional: Denies fever, chills, diaphoresis, appetite change and fatigue.  HEENT: Denies photophobia, eye pain, redness, hearing loss, ear pain, congestion, sore throat, rhinorrhea, sneezing, mouth sores, trouble swallowing, neck pain, neck stiffness and tinnitus.  C/o blurry vision Respiratory: Denies SOB, DOE, cough, chest tightness,  and wheezing.   Cardiovascular: Denies chest pain, palpitations and leg swelling.  Gastrointestinal: Denies nausea, vomiting, abdominal pain, diarrhea, constipation, blood in stool and abdominal distention.  Genitourinary: Denies dysuria, urgency, frequency, hematuria, flank pain and difficulty urinating.  Endocrine: Denies: hot or cold intolerance,  sweats, changes in hair or nails, polyuria, polydipsia. Musculoskeletal: Denies myalgias, back pain, joint swelling, arthralgias and gait problem.  Skin: Denies pallor, rash and wound.  Neurological: Denies, seizures, syncope, weakness,  numbness  Hematological: Denies adenopathy. Easy bruising, personal or family bleeding history  Psychiatric/Behavioral: Denies suicidal ideation, mood changes, confusion, nervousness, sleep disturbance and agitation   Physical Exam: Vitals:   12/09/18 1623  BP: 122/80  Pulse: 81  Temp: 98.2 F (36.8 C)  TempSrc: Oral  SpO2: 96%  Weight: 228 lb 6.4 oz (103.6 kg)  Height: 5\' 9"  (1.753 m)    Body mass index is 33.73 kg/m.   Constitutional: NAD, calm, comfortable Respiratory: clear to auscultation bilaterally, no wheezing, no crackles. Normal respiratory effort. No accessory muscle use.  Cardiovascular: Regular rate and rhythm, no murmurs / rubs / gallops. No extremity edema. 2+ pedal pulses.  Psychiatric: Normal judgment and insight. Alert and oriented x3   Impression and Plan:  Essential hypertension  -BP at goal today, he only resumed meds last week.  Dizzy spells -suspect related to elevated BP -increase water intake  Cramps of lower extremity  -?electrolyte deficiencies. -BMET today.    Patient Instructions  Great meeting you this afternoon.  -Cont taking your blood pressure medications, norvasc and losartan as directed -We will draw blood work today and notify you of the results   Hypertension Hypertension, commonly called high blood pressure, is when the force of blood pumping through the arteries is too strong. The arteries are the blood vessels that carry blood from the heart throughout the body. Hypertension forces the heart to work harder to pump blood and may cause arteries to become narrow or stiff. Having untreated or uncontrolled hypertension can cause heart attacks, strokes, kidney disease, and other problems. A  blood pressure reading consists of a higher number over a lower number. Ideally, your blood pressure should be below 120/80. The first ("top") number is called the systolic pressure. It is a measure of the pressure in your arteries as your heart beats. The second ("bottom") number is called the diastolic pressure. It is a measure of the pressure in your arteries as the heart relaxes. What are the causes? The cause of this condition is not known. What increases the risk? Some risk factors for high blood pressure are under your control. Others are not. Factors you can change  Smoking.  Having type 2 diabetes mellitus, high cholesterol, or both.  Not getting enough exercise or physical activity.  Being overweight.  Having too much fat, sugar, calories, or salt (sodium) in your diet.  Drinking too much alcohol. Factors that are difficult or impossible to change  Having chronic kidney disease.  Having a family history of high blood pressure.  Age. Risk increases with age.  Race. You may be at higher risk if you are African-American.  Gender. Men are at higher risk than women before  age 39. After age 74, women are at higher risk than men.  Having obstructive sleep apnea.  Stress. What are the signs or symptoms? Extremely high blood pressure (hypertensive crisis) may cause:  Headache.  Anxiety.  Shortness of breath.  Nosebleed.  Nausea and vomiting.  Severe chest pain.  Jerky movements you cannot control (seizures). How is this diagnosed? This condition is diagnosed by measuring your blood pressure while you are seated, with your arm resting on a surface. The cuff of the blood pressure monitor will be placed directly against the skin of your upper arm at the level of your heart. It should be measured at least twice using the same arm. Certain conditions can cause a difference in blood pressure between your right and left arms. Certain factors can cause blood pressure  readings to be lower or higher than normal (elevated) for a short period of time:  When your blood pressure is higher when you are in a health care provider's office than when you are at home, this is called white coat hypertension. Most people with this condition do not need medicines.  When your blood pressure is higher at home than when you are in a health care provider's office, this is called masked hypertension. Most people with this condition may need medicines to control blood pressure. If you have a high blood pressure reading during one visit or you have normal blood pressure with other risk factors:  You may be asked to return on a different day to have your blood pressure checked again.  You may be asked to monitor your blood pressure at home for 1 week or longer. If you are diagnosed with hypertension, you may have other blood or imaging tests to help your health care provider understand your overall risk for other conditions. How is this treated? This condition is treated by making healthy lifestyle changes, such as eating healthy foods, exercising more, and reducing your alcohol intake. Your health care provider may prescribe medicine if lifestyle changes are not enough to get your blood pressure under control, and if:  Your systolic blood pressure is above 130.  Your diastolic blood pressure is above 80. Your personal target blood pressure may vary depending on your medical conditions, your age, and other factors. Follow these instructions at home: Eating and drinking   Eat a diet that is high in fiber and potassium, and low in sodium, added sugar, and fat. An example eating plan is called the DASH (Dietary Approaches to Stop Hypertension) diet. To eat this way: ? Eat plenty of fresh fruits and vegetables. Try to fill half of your plate at each meal with fruits and vegetables. ? Eat whole grains, such as whole wheat pasta, brown rice, or whole grain bread. Fill about one  quarter of your plate with whole grains. ? Eat or drink low-fat dairy products, such as skim milk or low-fat yogurt. ? Avoid fatty cuts of meat, processed or cured meats, and poultry with skin. Fill about one quarter of your plate with lean proteins, such as fish, chicken without skin, beans, eggs, and tofu. ? Avoid premade and processed foods. These tend to be higher in sodium, added sugar, and fat.  Reduce your daily sodium intake. Most people with hypertension should eat less than 1,500 mg of sodium a day.  Limit alcohol intake to no more than 1 drink a day for nonpregnant women and 2 drinks a day for men. One drink equals 12 oz of beer, 5 oz  of wine, or 1 oz of hard liquor. Lifestyle   Work with your health care provider to maintain a healthy body weight or to lose weight. Ask what an ideal weight is for you.  Get at least 30 minutes of exercise that causes your heart to beat faster (aerobic exercise) most days of the week. Activities may include walking, swimming, or biking.  Include exercise to strengthen your muscles (resistance exercise), such as pilates or lifting weights, as part of your weekly exercise routine. Try to do these types of exercises for 30 minutes at least 3 days a week.  Do not use any products that contain nicotine or tobacco, such as cigarettes and e-cigarettes. If you need help quitting, ask your health care provider.  Monitor your blood pressure at home as told by your health care provider.  Keep all follow-up visits as told by your health care provider. This is important. Medicines  Take over-the-counter and prescription medicines only as told by your health care provider. Follow directions carefully. Blood pressure medicines must be taken as prescribed.  Do not skip doses of blood pressure medicine. Doing this puts you at risk for problems and can make the medicine less effective.  Ask your health care provider about side effects or reactions to medicines  that you should watch for. Contact a health care provider if:  You think you are having a reaction to a medicine you are taking.  You have headaches that keep coming back (recurring).  You feel dizzy.  You have swelling in your ankles.  You have trouble with your vision. Get help right away if:  You develop a severe headache or confusion.  You have unusual weakness or numbness.  You feel faint.  You have severe pain in your chest or abdomen.  You vomit repeatedly.  You have trouble breathing. Summary  Hypertension is when the force of blood pumping through your arteries is too strong. If this condition is not controlled, it may put you at risk for serious complications.  Your personal target blood pressure may vary depending on your medical conditions, your age, and other factors. For most people, a normal blood pressure is less than 120/80.  Hypertension is treated with lifestyle changes, medicines, or a combination of both. Lifestyle changes include weight loss, eating a healthy, low-sodium diet, exercising more, and limiting alcohol. This information is not intended to replace advice given to you by your health care provider. Make sure you discuss any questions you have with your health care provider. Document Released: 09/09/2005 Document Revised: 08/07/2016 Document Reviewed: 08/07/2016 Elsevier Interactive Patient Education  2019 [place] DASH stands for "Dietary Approaches to Stop Hypertension." The DASH eating plan is a healthy eating plan that has been shown to reduce high blood pressure (hypertension). It may also reduce your risk for type 2 diabetes, heart disease, and stroke. The DASH eating plan may also help with weight loss. What are tips for following this plan?  General guidelines  Avoid eating more than 2,300 mg (milligrams) of salt (sodium) a day. If you have hypertension, you may need to reduce your sodium intake to 1,500 mg a  day.  Limit alcohol intake to no more than 1 drink a day for nonpregnant women and 2 drinks a day for men. One drink equals 12 oz of beer, 5 oz of wine, or 1 oz of hard liquor.  Work with your health care provider to maintain a healthy body weight or  to lose weight. Ask what an ideal weight is for you.  Get at least 30 minutes of exercise that causes your heart to beat faster (aerobic exercise) most days of the week. Activities may include walking, swimming, or biking.  Work with your health care provider or diet and nutrition specialist (dietitian) to adjust your eating plan to your individual calorie needs. Reading food labels   Check food labels for the amount of sodium per serving. Choose foods with less than 5 percent of the Daily Value of sodium. Generally, foods with less than 300 mg of sodium per serving fit into this eating plan.  To find whole grains, look for the word "whole" as the first word in the ingredient list. Shopping  Buy products labeled as "low-sodium" or "no salt added."  Buy fresh foods. Avoid canned foods and premade or frozen meals. Cooking  Avoid adding salt when cooking. Use salt-free seasonings or herbs instead of table salt or sea salt. Check with your health care provider or pharmacist before using salt substitutes.  Do not fry foods. Cook foods using healthy methods such as baking, boiling, grilling, and broiling instead.  Cook with heart-healthy oils, such as olive, canola, soybean, or sunflower oil. Meal planning  Eat a balanced diet that includes: ? 5 or more servings of fruits and vegetables each day. At each meal, try to fill half of your plate with fruits and vegetables. ? Up to 6-8 servings of whole grains each day. ? Less than 6 oz of lean meat, poultry, or fish each day. A 3-oz serving of meat is about the same size as a deck of cards. One egg equals 1 oz. ? 2 servings of low-fat dairy each day. ? A serving of nuts, seeds, or beans 5 times  each week. ? Heart-healthy fats. Healthy fats called Omega-3 fatty acids are found in foods such as flaxseeds and coldwater fish, like sardines, salmon, and mackerel.  Limit how much you eat of the following: ? Canned or prepackaged foods. ? Food that is high in trans fat, such as fried foods. ? Food that is high in saturated fat, such as fatty meat. ? Sweets, desserts, sugary drinks, and other foods with added sugar. ? Full-fat dairy products.  Do not salt foods before eating.  Try to eat at least 2 vegetarian meals each week.  Eat more home-cooked food and less restaurant, buffet, and fast food.  When eating at a restaurant, ask that your food be prepared with less salt or no salt, if possible. What foods are recommended? The items listed may not be a complete list. Talk with your dietitian about what dietary choices are best for you. Grains Whole-grain or whole-wheat bread. Whole-grain or whole-wheat pasta. Brown rice. [place]. Bulgur. Whole-grain and low-sodium cereals. Pita bread. Low-fat, low-sodium crackers. Whole-wheat flour tortillas. Vegetables Fresh or frozen vegetables (raw, steamed, roasted, or grilled). Low-sodium or reduced-sodium tomato and vegetable juice. Low-sodium or reduced-sodium tomato sauce and tomato paste. Low-sodium or reduced-sodium canned vegetables. Fruits All fresh, dried, or frozen fruit. Canned fruit in natural juice (without added sugar). Meat and other protein foods Skinless chicken or [place]. Ground chicken or [place]. Pork with fat trimmed off. Fish and seafood. Egg whites. Dried beans, peas, or lentils. Unsalted nuts, nut butters, and seeds. Unsalted canned beans. Lean cuts of beef with fat trimmed off. Low-sodium, lean deli meat. Dairy Low-fat (1%) or fat-free (skim) milk. Fat-free, low-fat, or reduced-fat cheeses. Nonfat, low-sodium ricotta or cottage cheese. Low-fat or  nonfat yogurt. Low-fat, low-sodium cheese. Fats and oils Soft margarine  without trans fats. Vegetable oil. Low-fat, reduced-fat, or light mayonnaise and salad dressings (reduced-sodium). Canola, safflower, olive, soybean, and sunflower oils. Avocado. Seasoning and other foods Herbs. Spices. Seasoning mixes without salt. Unsalted popcorn and pretzels. Fat-free sweets. What foods are not recommended? The items listed may not be a complete list. Talk with your dietitian about what dietary choices are best for you. Grains Baked goods made with fat, such as croissants, muffins, or some breads. Dry pasta or rice meal packs. Vegetables Creamed or fried vegetables. Vegetables in a cheese sauce. Regular canned vegetables (not low-sodium or reduced-sodium). Regular canned tomato sauce and paste (not low-sodium or reduced-sodium). Regular tomato and vegetable juice (not low-sodium or reduced-sodium). Angie Fava. Olives. Fruits Canned fruit in a light or heavy syrup. Fried fruit. Fruit in cream or butter sauce. Meat and other protein foods Fatty cuts of meat. Ribs. Fried meat. Berniece Salines. Sausage. Bologna and other processed lunch meats. Salami. Fatback. Hotdogs. Bratwurst. Salted nuts and seeds. Canned beans with added salt. Canned or smoked fish. Whole eggs or egg yolks. Chicken or [place] with skin. Dairy Whole or 2% milk, cream, and half-and-half. Whole or full-fat cream cheese. Whole-fat or sweetened yogurt. Full-fat cheese. Nondairy creamers. Whipped toppings. Processed cheese and cheese spreads. Fats and oils Butter. Stick margarine. Lard. Shortening. Ghee. Bacon fat. Tropical oils, such as coconut, palm kernel, or palm oil. Seasoning and other foods Salted popcorn and pretzels. Onion salt, garlic salt, seasoned salt, table salt, and sea salt. Worcestershire sauce. Tartar sauce. Barbecue sauce. Teriyaki sauce. Soy sauce, including reduced-sodium. Steak sauce. Canned and packaged gravies. Fish sauce. Oyster sauce. Cocktail sauce. Horseradish that you find on the shelf. Ketchup.  Mustard. Meat flavorings and tenderizers. Bouillon cubes. Hot sauce and Tabasco sauce. Premade or packaged marinades. Premade or packaged taco seasonings. Relishes. Regular salad dressings. Where to find more information:  National Heart, Lung, and [place]: https://wilson-eaton.com/  American Heart Association: www.heart.org Summary  The DASH eating plan is a healthy eating plan that has been shown to reduce high blood pressure (hypertension). It may also reduce your risk for type 2 diabetes, heart disease, and stroke.  With the DASH eating plan, you should limit salt (sodium) intake to 2,300 mg a day. If you have hypertension, you may need to reduce your sodium intake to 1,500 mg a day.  When on the DASH eating plan, aim to eat more fresh fruits and vegetables, whole grains, lean proteins, low-fat dairy, and heart-healthy fats.  Work with your health care provider or diet and nutrition specialist (dietitian) to adjust your eating plan to your individual calorie needs. This information is not intended to replace advice given to you by your health care provider. Make sure you discuss any questions you have with your health care provider. Document Released: 08/29/2011 Document Revised: 09/02/2016 Document Reviewed: 09/02/2016 Elsevier Interactive Patient Education  2019 [place], RN DNP Student [facility]

## 2018-12-10 LAB — BASIC METABOLIC PANEL
BUN: 15 mg/dL (ref 6–23)
CHLORIDE: 103 meq/L (ref 96–112)
CO2: 24 mEq/L (ref 19–32)
Calcium: 9.3 mg/dL (ref 8.4–10.5)
Creatinine, Ser: 1.32 mg/dL (ref 0.40–1.50)
GFR: 66.61 mL/min (ref >60.00)
Glucose, Bld: 80 mg/dL (ref 70–99)
POTASSIUM: 4 meq/L (ref 3.5–5.1)
Sodium: 136 mEq/L (ref 135–145)

## 2018-12-30 ENCOUNTER — Other Ambulatory Visit: Payer: Self-pay

## 2018-12-30 ENCOUNTER — Ambulatory Visit (INDEPENDENT_AMBULATORY_CARE_PROVIDER_SITE_OTHER): Payer: BLUE CROSS/BLUE SHIELD | Admitting: Family Medicine

## 2018-12-30 ENCOUNTER — Ambulatory Visit: Payer: Self-pay | Admitting: *Deleted

## 2018-12-30 VITALS — Temp 98.1°F

## 2018-12-30 DIAGNOSIS — R509 Fever, unspecified: Secondary | ICD-10-CM

## 2018-12-30 NOTE — Telephone Encounter (Signed)
Feels feverish comes and goes. Feels it in his feet, they get hot. Took Dayquil and Ibuprofen and started sweating. Denies shortness of breath or cough, no body aches. Chest felt turning hot last night.  At work now. Pt advised that he should be at home away from other people with a fever. Home care advice given to patient. He is afraid to stay home, that he may lose his job. Notified [facility] at [facility] regarding his feeling feverish and at work. Call conference to the office. And encounter routed to the office.  Reason for Disposition . 1] COVID-19 infection diagnosed or suspected AND [2] mild symptoms (fever, cough) AND [2] no trouble breathing or other complications  Answer Assessment - Initial Assessment Questions 1. COVID-19 DIAGNOSIS: "Who made your Coronavirus (COVID-19) diagnosis?" "Was it confirmed by a positive lab test?" If not diagnosed by a HCP, ask "Are there lots of cases (community spread) where you live?" (See public health department website, if unsure)   * MAJOR community spread: high number of cases; numbers of cases are increasing; many people hospitalized.   * MINOR community spread: low number of cases; not increasing; few or no people hospitalized     Have not been around any on that has been suspected of having the virus. 2. ONSET: "When did the COVID-19 symptoms start?"      The first times last night but felt funny Tues. 3. WORST SYMPTOM: "What is your worst symptom?" (e.g., cough, fever, shortness of breath, muscle aches)     Fever, 4. COUGH: "How bad is the cough?"       no 5. FEVER: "Do you have a fever?" If so, ask: "What is your temperature, how was it measured, and when did it start?"     Yes feels feverish, no theromometer 6. RESPIRATORY STATUS: "Describe your breathing?" (e.g., shortness of breath, wheezing, unable to speak)      no 7. BETTER-SAME-WORSE: "Are you getting better, staying the same or getting worse compared to yesterday?"  If getting worse,  ask, "In what way?"     About the same 8. HIGH RISK DISEASE: "Do you have any chronic medical problems?" (e.g., asthma, heart or lung disease, weak immune system, etc.)     no 9. PREGNANCY: "Is there any chance you are pregnant?" "When was your last menstrual period?"     n/a 10. OTHER SYMPTOMS: "Do you have any other symptoms?"  (e.g., runny nose, headache, sore throat, loss of smell)       no  Protocols used: CORONAVIRUS (COVID-19) DIAGNOSED OR SUSPECTED-A-AH

## 2018-12-30 NOTE — Telephone Encounter (Signed)
Patient has an appointment with Dr. [name] today at 3:15pm. Juluis Rainier

## 2018-12-30 NOTE — Progress Notes (Signed)
Patient ID: [name], male   DOB: 1955/10/30, 63 y.o.   MRN: [id number]  Virtual Visit via Video Note  I connected with [name] on 12/30/18 at  3:15 PM EDT by a video enabled telemedicine application and verified that I am speaking with the correct person using two identifiers.  Location patient: home Location provider:work or home office Persons participating in the virtual visit: patient, provider  I discussed the limitations of evaluation and management by telemedicine and the availability of in person appointments. The patient expressed understanding and agreed to proceed.   HPI: Patient called earlier today with complaints of subjective fever of 1 day duration.  He did not take his temperature.  He had some sweats last night.  No chills.  Took some ibuprofen.  He has had some mild malaise.  He denies any cough, dyspnea, nasal congestion, sore throat, skin rashes, dysuria, or any abdominal pain.  No recent travels.  No sick contacts.  Of note, patient has had UTI in the past.  He has had what sounds like urethral stricture which required dilatation.  He does infrequent in and out caths and has not done one for over a month now.  He had urine dipstick on 12/09/2018 which showed positive nitrite and trace leukocytes but no blood.  Again, he denies any burning with urination at this time.  No nausea or vomiting.   ROS: See pertinent positives and negatives per HPI.  Past Medical History:  Diagnosis Date  . Epididymitis   . GERD (gastroesophageal reflux disease)   . Gout of big toe 05/21/2013  . Hard of hearing   . History of gout   . Hx of adenomatous polyp of colon 02/01/2017  . Hypertension   . Orchitis   . Sepsis [facility]) hospitalized 12/25/2015    Past Surgical History:  Procedure Laterality Date  . CYSTOSCOPY WITH DIRECT VISION INTERNAL URETHROTOMY N/A 01/25/2016   Procedure:  DIRECT VISION INTERNAL URETHROTOMY;  Surgeon: Ardis Hughs, MD;  [facility]: [facility];  Service:  Urology;  Laterality: N/A;  . CYSTOSCOPY WITH RETROGRADE URETHROGRAM N/A 01/25/2016   Procedure:  RETROGRADE URETHROGRAM;  Surgeon: Ardis Hughs, MD;  Location: [facility];  Service: Urology;  Laterality: N/A;  . INGUINAL HERNIA REPAIR Right   . WISDOM TOOTH EXTRACTION      Family History  Problem Relation Age of Onset  . Diabetes Mellitus II Paternal Grandfather   . Throat cancer Sister   . Colon cancer Neg Hx   . Esophageal cancer Neg Hx   . Pancreatic cancer Neg Hx   . Prostate cancer Neg Hx   . Rectal cancer Neg Hx   . Stomach cancer Neg Hx     SOCIAL HX: Non-smoker   Current Outpatient Medications:  .  amLODipine (NORVASC) 5 MG tablet, TAKE 1 TABLET BY MOUTH ONCE DAILY, Disp: 90 tablet, Rfl: 0 .  colchicine (COLCRYS) 0.6 MG tablet, Take 1 tablet (0.6 mg total) by mouth 2 (two) times daily., Disp: 20 tablet, Rfl: 2 .  diclofenac sodium (VOLTAREN) 1 % GEL, Apply 2 g topically 4 (four) times daily., Disp: 100 g, Rfl: 2 .  HYDROcodone-acetaminophen (NORCO) 10-325 MG tablet, Take 1 tablet by mouth every 6 (six) hours as needed., Disp: 60 tablet, Rfl: 0 .  ibuprofen (ADVIL,MOTRIN) 600 MG tablet, Take 1 tablet (600 mg total) by mouth every 6 (six) hours as needed for headache., Disp: 30 tablet, Rfl: 0 .  losartan (COZAAR) 100 MG tablet, Take  1 tablet (100 mg total) by mouth daily., Disp: 90 tablet, Rfl: 3 .  Multiple Vitamins-Minerals (MENS 50+ MULTI VITAMIN/MIN PO), Take 1 tablet by mouth daily., Disp: , Rfl:  .  naproxen (NAPROSYN) 500 MG tablet, TAKE 1 TABLET BY MOUTH TWICE A DAY, Disp: 30 tablet, Rfl: 0 .  pantoprazole (PROTONIX) 40 MG tablet, Take 1 tablet (40 mg total) by mouth daily., Disp: 90 tablet, Rfl: 3  EXAM:  VITALS per patient if applicable:  GENERAL: alert, oriented, appears well and in no acute distress  HEENT: atraumatic, conjunttiva clear, no obvious abnormalities on inspection of external nose and ears  NECK: normal movements of the head and neck  LUNGS:  on inspection no signs of respiratory distress, breathing rate appears normal, no obvious gross SOB, gasping or wheezing  CV: no obvious cyanosis  MS: moves all visible extremities without noticeable abnormality  PSYCH/NEURO: pleasant and cooperative, no obvious depression or anxiety, speech and thought processing grossly intact  ASSESSMENT AND PLAN:  Discussed the following assessment and plan:  Subjective fever.  Patient does not describe any respiratory symptoms.  Does have history of UTI-though he denies any current urinary symptoms  -We recommend that patient come early tomorrow morning for urinalysis and culture if indicated     I discussed the assessment and treatment plan with the patient. The patient was provided an opportunity to ask questions and all were answered. The patient agreed with the plan and demonstrated an understanding of the instructions.   The patient was advised to call back or seek an in-person evaluation if the symptoms worsen or if the condition fails to improve as anticipated   Carolann Littler, MD

## 2018-12-30 NOTE — Telephone Encounter (Signed)
Patient complains of fever and head congestion that started last night.  He denies any headache, dizziness, SOB, chest pain or pressure, cough, sneezing.   Doxy.me appointment made for 12/30/2018 with Dr [name].

## 2018-12-31 ENCOUNTER — Other Ambulatory Visit: Payer: Self-pay | Admitting: Family Medicine

## 2018-12-31 ENCOUNTER — Encounter: Payer: Self-pay | Admitting: Family Medicine

## 2018-12-31 DIAGNOSIS — R509 Fever, unspecified: Secondary | ICD-10-CM | POA: Diagnosis not present

## 2018-12-31 LAB — POCT URINALYSIS DIPSTICK
Bilirubin, UA: NEGATIVE
Blood, UA: NEGATIVE
Glucose, UA: NEGATIVE
Ketones, UA: NEGATIVE
Protein, UA: NEGATIVE
Spec Grav, UA: 1.015 (ref 1.010–1.025)
Urobilinogen, UA: 0.2 E.U./dL
pH, UA: 6 (ref 5.0–8.0)

## 2018-12-31 MED ORDER — CIPROFLOXACIN HCL 500 MG PO TABS: 500.0000 mg | ORAL_TABLET | Freq: Two times a day (BID) | ORAL | 0 refills | Status: DC

## 2018-12-31 NOTE — Addendum Note (Signed)
Addended by: Elmer Picker on: 12/31/2018 08:48 AM   Modules accepted: Orders

## 2018-12-31 NOTE — Addendum Note (Signed)
Addended by: Elmer Picker on: 12/31/2018 08:05 AM   Modules accepted: Orders

## 2019-01-02 LAB — URINE CULTURE
MICRO NUMBER:: 386096
SPECIMEN QUALITY:: ADEQUATE

## 2019-01-05 ENCOUNTER — Telehealth: Payer: Self-pay | Admitting: *Deleted

## 2019-01-05 DIAGNOSIS — N390 Urinary tract infection, site not specified: Principal | ICD-10-CM

## 2019-01-05 DIAGNOSIS — A499 Bacterial infection, unspecified: Secondary | ICD-10-CM

## 2019-01-05 MED ORDER — CEPHALEXIN 500 MG PO CAPS: 500.0000 mg | ORAL_CAPSULE | Freq: Three times a day (TID) | ORAL | 0 refills | Status: DC

## 2019-01-05 NOTE — Telephone Encounter (Signed)
-----   Message from [name], MD sent at 01/05/2019 11:47 AM EDT ----- Not a common side effect- but is possible.  Stop the Cipro.  Send in Keflex 500 mg po tid for 3 more days.   ----- Message ----- From: Alverda Skeans, RN Sent: 01/05/2019  11:15 AM EDT To: [name], MD  Spoke with patient. Notified patient that per Dr. [name] UTI is E.col and the cipro should cover it. Patient reports he has two more days left on Cipro but his feet has been feeling like they are ob fire. Patient wants to know could this be a side effect related to Cipro or should he be worried about DM since it runs in his family.

## 2019-01-05 NOTE — Telephone Encounter (Signed)
Spoke with patient. Patient notified per Dr. [name] that burning in the bottom of the feet is not a common side effect- but is possible. Stop the Cipro and start taking Keflex 500 mg po tid for 3 more days. Patietn verbalized understanding.

## 2019-01-22 ENCOUNTER — Telehealth: Payer: Self-pay | Admitting: *Deleted

## 2019-01-22 ENCOUNTER — Other Ambulatory Visit: Payer: Self-pay

## 2019-01-22 MED ORDER — CIPROFLOXACIN HCL 500 MG PO TABS: 500.0000 mg | ORAL_TABLET | Freq: Two times a day (BID) | ORAL | 0 refills | Status: DC

## 2019-01-22 NOTE — Telephone Encounter (Signed)
Medication has been sent and he has an appointment on 01/26/19 at 1:15 pm. Called patient and advised and patient verbalized an understanding.

## 2019-01-22 NOTE — Telephone Encounter (Signed)
I would send in Cipro 500 mg one po bid for 5 more days and set up Doxy (or phone follow up) by early next week to discuss infection prevention issues.

## 2019-01-22 NOTE — Telephone Encounter (Signed)
Copied from [place] [id number]. Topic: General - Other >> Jan 22, 2019  9:56 AM Keene Breath wrote: Reason for CRM: Patient called to speak with the nurse regarding the antibiotics that the doctor prescribed.  Patient stated that he has been having some symptoms and wanted to talk to the nurse about it.  CB# [phone]

## 2019-01-22 NOTE — Telephone Encounter (Signed)
Patient states that he is having what feels like a fever comes and goes, feet get really hot and chest gets tight, feels really drained. Patient states that these symptoms come and go and not sure if he needs more antibiotics to clear up E coli? He has started feeling bad again with the same symptoms. Could he be causing the infection to stay because he has been using the same catheter tube for a long time and he buys the gel and uses in the tube and just washes the tube and reuses over and over. He was not told to change or to use a new one. He started feeling a little better from the Keflex today but felt worse up until today.  Please advise.

## 2019-01-27 ENCOUNTER — Ambulatory Visit (INDEPENDENT_AMBULATORY_CARE_PROVIDER_SITE_OTHER): Payer: BLUE CROSS/BLUE SHIELD | Admitting: Family Medicine

## 2019-01-27 ENCOUNTER — Other Ambulatory Visit: Payer: Self-pay

## 2019-01-27 DIAGNOSIS — Z8739 Personal history of other diseases of the musculoskeletal system and connective tissue: Secondary | ICD-10-CM | POA: Diagnosis not present

## 2019-01-27 DIAGNOSIS — N35919 Unspecified urethral stricture, male, unspecified site: Secondary | ICD-10-CM | POA: Diagnosis not present

## 2019-01-27 NOTE — Progress Notes (Signed)
Patient ID: [name], male   DOB: 1956-03-23, 63 y.o.   MRN: [id number]  This visit type was conducted due to national recommendations for restrictions regarding the COVID-19 pandemic in an effort to limit this patient's exposure and mitigate transmission in our community.   Virtual Visit via Video Note  I connected with [name] on 01/27/19 at  1:15 PM EDT by a video enabled telemedicine application and verified that I am speaking with the correct person using two identifiers.  Location patient: home Location provider:work or home office Persons participating in the virtual visit: patient, provider  I discussed the limitations of evaluation and management by telemedicine and the availability of in person appointments. The patient expressed understanding and agreed to proceed.   HPI: Patient had recent fever and E. coli UTI.  He has history of urethral stricture.  He was treated with Cipro and symptoms resolved.  He then had recurrence of some low-grade fever after he finished the initial course of Cipro and started back on Cipro.  He was instructed after initial dilatation of his urethral stricture to self cath 2 times per week but he is down now to about once per month to try to keep the stricture open.  He states he has used the same catheter for 4 years.  He does clean this with soap and water after use.  Currently has no burning with urination.  No fevers or chills.  He has noticed some gradual slow stream over the past couple years.  He is reluctant to go back to urology at this time.  Chronic back pain.  He takes hydrocodone very infrequently for severe pain.   ROS: See pertinent positives and negatives per HPI.  Past Medical History:  Diagnosis Date  . Epididymitis   . GERD (gastroesophageal reflux disease)   . Gout of big toe 05/21/2013  . Hard of hearing   . History of gout   . Hx of adenomatous polyp of colon 02/01/2017  . Hypertension   . Orchitis   . Sepsis [facility])  hospitalized 12/25/2015    Past Surgical History:  Procedure Laterality Date  . CYSTOSCOPY WITH DIRECT VISION INTERNAL URETHROTOMY N/A 01/25/2016   Procedure:  DIRECT VISION INTERNAL URETHROTOMY;  Surgeon: Ardis Hughs, MD;  [facility];  Service: Urology;  Laterality: N/A;  . CYSTOSCOPY WITH RETROGRADE URETHROGRAM N/A 01/25/2016   Procedure:  RETROGRADE URETHROGRAM;  Surgeon: Ardis Hughs, MD;  [facility];  Service: Urology;  Laterality: N/A;  . INGUINAL HERNIA REPAIR Right   . WISDOM TOOTH EXTRACTION      Family History  Problem Relation Age of Onset  . Diabetes Mellitus II Paternal Grandfather   . Throat cancer Sister   . Colon cancer Neg Hx   . Esophageal cancer Neg Hx   . Pancreatic cancer Neg Hx   . Prostate cancer Neg Hx   . Rectal cancer Neg Hx   . Stomach cancer Neg Hx     SOCIAL HX: Non-smoker   Current Outpatient Medications:  .  amLODipine (NORVASC) 5 MG tablet, TAKE 1 TABLET BY MOUTH ONCE DAILY, Disp: 90 tablet, Rfl: 0 .  ciprofloxacin (CIPRO) 500 MG tablet, Take 1 tablet (500 mg total) by mouth 2 (two) times daily., Disp: 10 tablet, Rfl: 0 .  colchicine (COLCRYS) 0.6 MG tablet, Take 1 tablet (0.6 mg total) by mouth 2 (two) times daily., Disp: 20 tablet, Rfl: 2 .  diclofenac sodium (VOLTAREN) 1 % GEL, Apply 2  g topically 4 (four) times daily., Disp: 100 g, Rfl: 2 .  HYDROcodone-acetaminophen (NORCO) 10-325 MG tablet, Take 1 tablet by mouth every 6 (six) hours as needed., Disp: 60 tablet, Rfl: 0 .  losartan (COZAAR) 100 MG tablet, Take 1 tablet (100 mg total) by mouth daily., Disp: 90 tablet, Rfl: 3 .  Multiple Vitamins-Minerals (MENS 50+ MULTI VITAMIN/MIN PO), Take 1 tablet by mouth daily., Disp: , Rfl:  .  pantoprazole (PROTONIX) 40 MG tablet, Take 1 tablet (40 mg total) by mouth daily., Disp: 90 tablet, Rfl: 3  EXAM:  VITALS per patient if applicable:  GENERAL: alert, oriented, appears well and in no acute distress  HEENT: atraumatic,  conjunttiva clear, no obvious abnormalities on inspection of external nose and ears  NECK: normal movements of the head and neck  LUNGS: on inspection no signs of respiratory distress, breathing rate appears normal, no obvious gross SOB, gasping or wheezing  CV: no obvious cyanosis  MS: moves all visible extremities without noticeable abnormality  PSYCH/NEURO: pleasant and cooperative, no obvious depression or anxiety, speech and thought processing grossly intact  ASSESSMENT AND PLAN:  Discussed the following assessment and plan:  #1 history of urethral stricture.  Increased risk for UTI.  Self-catheterization once per month.  Recent E. coli UTI  -Needs to replace current catheter which is 62 years old -He will check with local medical supply place -We also discussed proper cleaning with soap and water and then rinsing and then leaving in a dry clean place between use and replacing every couple months -Follow-up promptly for any recurrent fever or other urinary symptoms  #2 history of chronic intermittent low back pain.  Patient takes infrequent hydrocodone for that     I discussed the assessment and treatment plan with the patient. The patient was provided an opportunity to ask questions and all were answered. The patient agreed with the plan and demonstrated an understanding of the instructions.   The patient was advised to call back or seek an in-person evaluation if the symptoms worsen or if the condition fails to improve as anticipated.     Carolann Littler, MD

## 2019-01-28 ENCOUNTER — Telehealth: Payer: Self-pay | Admitting: *Deleted

## 2019-01-28 NOTE — Telephone Encounter (Signed)
Copied from [place] [id number]. Topic: General - Other >> Jan 27, 2019  2:12 PM Antonieta Iba C wrote: Reason for CRM: pt called in to make provider aware that he did speak with [facility] on [address] and was advised that pt would need a Rx for catheter. Pt would like further assistance. Pt says that he's not sure of what size to use.  Please call back

## 2019-01-28 NOTE — Telephone Encounter (Signed)
Please see message. °

## 2019-01-28 NOTE — Telephone Encounter (Signed)
We had asked him yesterday to find out what size he is using.  We need this information to order.

## 2019-01-29 ENCOUNTER — Telehealth: Payer: Self-pay

## 2019-01-29 NOTE — Telephone Encounter (Signed)
Prescription written

## 2019-01-29 NOTE — Telephone Encounter (Signed)
Please see size listed  Copied from [place] [phone]. Topic: General - Other >> Jan 27, 2019  2:12 PM Antonieta Iba C wrote: Reason for CRM: pt called in to make provider aware that he did speak with [facility] on [address] and was advised that pt would need a Rx for catheter. Pt would like further assistance. Pt says that he's not sure of what size to use.  Please call back >> Jan 28, 2019  4:01 PM Rainey Pines A wrote: Patient  Called back to let us know the size of the  Catheter needs to be [facility]

## 2019-01-29 NOTE — Telephone Encounter (Signed)
[facility] on [address] at [phone] and received the fax number of [phone] and faxed over the hand written prescription for the requested catheters, #[id number]. [facility] stated that they can fill this for a year.  Called patient and let him know that I have sent these in to this location. Patient verbalized an understanding.

## 2019-03-15 ENCOUNTER — Other Ambulatory Visit: Payer: Self-pay

## 2019-03-15 ENCOUNTER — Ambulatory Visit (INDEPENDENT_AMBULATORY_CARE_PROVIDER_SITE_OTHER): Payer: BC Managed Care – PPO | Admitting: Family Medicine

## 2019-03-15 ENCOUNTER — Ambulatory Visit: Payer: Self-pay | Admitting: Family Medicine

## 2019-03-15 DIAGNOSIS — R55 Syncope and collapse: Secondary | ICD-10-CM

## 2019-03-15 NOTE — Telephone Encounter (Signed)
Pt called in c/o getting a bad stomach cramp while sitting at the table eating in a restaurant yesterday.   He got dizzy and lightheaded and next thing he knew people were helping him up off the floor.  He doesn't remember collapsing.  After they helped him up they asked if he was alright and pt said yes and drove himself home.  I warm transferred his call to Dr. Erick Blinks office to be scheduled due to the COVID-19 pandemic.  I sent my notes to the office.   Reason for Disposition . [1] MODERATE dizziness (e.g., interferes with normal activities) AND [2] has NOT been evaluated by physician for this  (Exception: dizziness caused by heat exposure, sudden standing, or poor fluid intake)  Answer Assessment - Initial Assessment Questions 1. DESCRIPTION: "Describe your dizziness."     Yesterday I was sitting at a table in the resturant and got a bad cramp in my stomach.  I stood up and next thing I knew people were helping me up.   I was in the heat yesterday morning sweating a lot.   I get cramps in my legs too. 2. LIGHTHEADED: "Do you feel lightheaded?" (e.g., somewhat faint, woozy, weak upon standing)     I've never been this dizzy.   This hit all of a sudden.   I didn't have a chance to grab anything on the way down.   The people walked me to my car and I told them I was fine so I drove myself home.   I don't remember going down.  I was a little lightheaded then down I went.   I remember driving home and I was fine. Today I've been fine.   BP 130/90.  3. VERTIGO: "Do you feel like either you or the room is spinning or tilting?" (i.e. vertigo)     No 4. SEVERITY: "How bad is it?"  "Do you feel like you are going to faint?" "Can you stand and walk?"   - MILD - walking normally   - MODERATE - interferes with normal activities (e.g., work, school)    - SEVERE - unable to stand, requires support to walk, feels like passing out now.      I did faint yesterday 5. ONSET:  "When did the dizziness  begin?"     Yesterday  6. AGGRAVATING FACTORS: "Does anything make it worse?" (e.g., standing, change in head position)     I was in the heat yesterday and drank 2 small containers of Gator Aid so not much and I was sweating a lot. 7. HEART RATE: "Can you tell me your heart rate?" "How many beats in 15 seconds?"  (Note: not all patients can do this)       *No Answer* 8. CAUSE: "What do you think is causing the dizziness?"     The sudden cramp in my stomach and me jumping up so fast maybe caused it. 9. RECURRENT SYMPTOM: "Have you had dizziness before?" If so, ask: "When was the last time?" "What happened that time?"     No 10. OTHER SYMPTOMS: "Do you have any other symptoms?" (e.g., fever, chest pain, vomiting, diarrhea, bleeding)       I get cramps in my legs.   I get a cold feeling in my body and cramps in my legs and I get dizzy and sweaty.    The cramps hit me hard.   I've talked to Dr. [name] about the cramps in my legs before. 11.  PREGNANCY: "Is there any chance you are pregnant?" "When was your last menstrual period?"       N/A  Protocols used: DIZZINESS [facility]

## 2019-03-15 NOTE — Telephone Encounter (Signed)
Patient states that he has been drinking [place] tea to help him loose weight and keep BMs regulated. He stated that he has cramps in his thighs and legs at times and sometimes he gets dizzy and has a cold sweat and feels like he could pass out.  Patient has a telephone visit today at 4:15pm.

## 2019-03-15 NOTE — Telephone Encounter (Signed)
We performed telephone follow up today.  ?vasovagal episode.

## 2019-03-15 NOTE — Progress Notes (Signed)
Patient ID: [name], male   DOB: [birth year], 63 y.o.   MRN: [id number]  This visit type was conducted due to national recommendations for restrictions regarding the COVID-19 pandemic in an effort to limit this patient's exposure and mitigate transmission in our community.   Virtual Visit via Telephone Note  I connected with [name] on 03/15/19 at  4:15 PM EDT by telephone and verified that I am speaking with the correct person using two identifiers.   I discussed the limitations, risks, security and privacy concerns of performing an evaluation and management service by telephone and the availability of in person appointments. I also discussed with the patient that there may be a patient responsible charge related to this service. The patient expressed understanding and agreed to proceed.  Location patient: home Location provider: work or home office Participants present for the call: patient, provider Patient did not have a visit in the prior 7 days to address this/these issue(s).   History of Present Illness: Patient called with syncopal episode last night around 8 PM when he was eating at a restaurant.  He states that he had been outside much of the day at church in the heat but did not recall any dizziness when he went to the restaurant.  He was sitting at the table when he noticed a severe abdominal cramp like a muscle cramp and when he stood up to try to walk this out he found himself on the floor.  He came back around promptly.  There was no suspicion of any seizure type activity.  He did have an alcoholic beverage prior to passing out.  No chest pains.  No palpitations.  No confusion.  No fever.  No dyspnea.  No focal weakness or slurred speech.  He has hypertension treated with losartan and amlodipine.  His blood pressures actually up today with reading earlier today 180/108.  He has been compliant with therapy.  Has not had any dizziness today.  No recurrent syncope.  Past  Medical History:  Diagnosis Date  . Epididymitis   . GERD (gastroesophageal reflux disease)   . Gout of big toe 05/21/2013  . Hard of hearing   . History of gout   . Hx of adenomatous polyp of colon 02/01/2017  . Hypertension   . Orchitis   . Sepsis [facility]) hospitalized 12/25/2015   Past Surgical History:  Procedure Laterality Date  . CYSTOSCOPY WITH DIRECT VISION INTERNAL URETHROTOMY N/A 01/25/2016   Procedure:  DIRECT VISION INTERNAL URETHROTOMY;  Surgeon: Ardis Hughs, MD;  [facility];  Service: Urology;  Laterality: N/A;  . CYSTOSCOPY WITH RETROGRADE URETHROGRAM N/A 01/25/2016   Procedure:  RETROGRADE URETHROGRAM;  Surgeon: Ardis Hughs, MD;  [facility];  Service: Urology;  Laterality: N/A;  . INGUINAL HERNIA REPAIR Right   . WISDOM TOOTH EXTRACTION      reports that he has never smoked. He has never used smokeless tobacco. He reports current alcohol use of about 6.0 standard drinks of alcohol per week. He reports that he does not use drugs. family history includes Diabetes Mellitus II in his paternal grandfather; Throat cancer in his sister. Allergies  Allergen Reactions  . Lisinopril Cough      Observations/Objective: Patient sounds cheerful and well on the phone. I do not appreciate any SOB. Speech and thought processing are grossly intact. Patient reported vitals:  Assessment and Plan:  #1 Reported brief syncopal episode last night in the setting of severe abdominal muscle cramp.  Suspect this was a vasovagal episode based on description.  #2 Hypertension.  This has been controlled in the past.  Up today.  Monitor.  May need to increase the Amlodipine to 10 mg if staying up.  Follow Up Instructions:  -Encouraged to stay well-hydrated -Follow-up immediately for any dizziness or recurrent syncope -Monitor blood pressure closely next couple days and if consistent readings up over 150/90 be in touch. -Patient encouraged to set up office follow-up soon  to reassess   [id number] [id number] [id number] 11-20 [id number] 21-30 I did not refer this patient for an OV in the next 24 hours for this/these issue(s).  I discussed the assessment and treatment plan with the patient. The patient was provided an opportunity to ask questions and all were answered. The patient agreed with the plan and demonstrated an understanding of the instructions.   The patient was advised to call back or seek an in-person evaluation if the symptoms worsen or if the condition fails to improve as anticipated.  I provided 24 minutes of non-face-to-face time during this encounter.   Carolann Littler, MD

## 2019-03-16 ENCOUNTER — Other Ambulatory Visit: Payer: Self-pay

## 2019-03-16 ENCOUNTER — Encounter (HOSPITAL_COMMUNITY): Payer: Self-pay

## 2019-03-16 ENCOUNTER — Emergency Department (HOSPITAL_COMMUNITY)
Admission: EM | Admit: 2019-03-16 | Discharge: 2019-03-16 | Disposition: A | Discharge status: Home / Self Care | Payer: BC Managed Care – PPO | Attending: Emergency Medicine | Admitting: Emergency Medicine

## 2019-03-16 DIAGNOSIS — Z79899 Other long term (current) drug therapy: Secondary | ICD-10-CM | POA: Insufficient documentation

## 2019-03-16 DIAGNOSIS — N390 Urinary tract infection, site not specified: Secondary | ICD-10-CM | POA: Diagnosis not present

## 2019-03-16 DIAGNOSIS — R55 Syncope and collapse: Secondary | ICD-10-CM | POA: Diagnosis not present

## 2019-03-16 DIAGNOSIS — I951 Orthostatic hypotension: Secondary | ICD-10-CM | POA: Diagnosis not present

## 2019-03-16 DIAGNOSIS — I10 Essential (primary) hypertension: Secondary | ICD-10-CM | POA: Diagnosis not present

## 2019-03-16 LAB — CBC
HCT: 40.2 % (ref 39.0–52.0)
Hemoglobin: 13.3 g/dL (ref 13.0–17.0)
MCH: 29 pg (ref 26.0–34.0)
MCHC: 33.1 g/dL (ref 30.0–36.0)
MCV: 87.6 fL (ref 80.0–100.0)
Platelets: 140 10*3/uL — ABNORMAL LOW (ref 150–400)
RBC: 4.59 MIL/uL (ref 4.22–5.81)
RDW: 14.7 % (ref 11.5–15.5)
WBC: 3.9 10*3/uL — ABNORMAL LOW (ref 4.0–10.5)
nRBC: 0 % (ref 0.0–0.2)

## 2019-03-16 LAB — URINALYSIS, ROUTINE W REFLEX MICROSCOPIC
Bilirubin Urine: NEGATIVE
Glucose, UA: NEGATIVE mg/dL
Hgb urine dipstick: NEGATIVE
Ketones, ur: NEGATIVE mg/dL
Nitrite: NEGATIVE
Protein, ur: NEGATIVE mg/dL
Specific Gravity, Urine: 1.017 (ref 1.005–1.030)
pH: 5 (ref 5.0–8.0)

## 2019-03-16 LAB — BASIC METABOLIC PANEL
Anion gap: 9 (ref 5–15)
BUN: 18 mg/dL (ref 8–23)
CO2: 23 mmol/L (ref 22–32)
Calcium: 8.8 mg/dL — ABNORMAL LOW (ref 8.9–10.3)
Chloride: 103 mmol/L (ref 98–111)
Creatinine, Ser: 1.24 mg/dL (ref 0.61–1.24)
GFR calc Af Amer: >60 mL/min (ref >60)
GFR calc non Af Amer: >60 mL/min (ref >60)
Glucose, Bld: 102 mg/dL — ABNORMAL HIGH (ref 70–99)
Potassium: 3.7 mmol/L (ref 3.5–5.1)
Sodium: 135 mmol/L (ref 135–145)

## 2019-03-16 LAB — CK: Total CK: 680 U/L — ABNORMAL HIGH (ref 49–397)

## 2019-03-16 LAB — CBG MONITORING, ED: Glucose-Capillary: 92 mg/dL (ref 70–99)

## 2019-03-16 MED ORDER — FOSFOMYCIN TROMETHAMINE 3 G PO PACK
3.0000 g | PACK | Freq: Once | ORAL | Status: AC
  Administered 2019-03-16: 3 g via ORAL
  Filled 2019-03-16: qty 3

## 2019-03-16 MED ORDER — SODIUM CHLORIDE 0.9 % IV BOLUS
1000.0000 mL | Freq: Once | INTRAVENOUS | Status: AC
  Administered 2019-03-16: 1000 mL via INTRAVENOUS

## 2019-03-16 MED ORDER — SODIUM CHLORIDE 0.9% FLUSH
3.0000 mL | Freq: Once | INTRAVENOUS | Status: AC
  Administered 2019-03-16: 3 mL via INTRAVENOUS

## 2019-03-16 NOTE — ED Triage Notes (Signed)
Patient states Sunday he was not feeling good, had a cramp, stood up, and "passed out" at a restaurant and fell on concrete. He states we he came too two young girls were helping him up and walked him to his car.  States he bruised his knee. Unsure of he hit his head.    Yesterday patient was feeling bad and called PCP and told if his BP does not come down after taking BP medication to come to ED.   Today patient passed out again on his couch. Patient did not hit his head today or get hurt from passing out.    A/Ox4 Ambulatory in triage.    BP in triage-170/103

## 2019-03-16 NOTE — ED Provider Notes (Signed)
[facility] DEPT Provider Note   CSN: [id number] Arrival date & time: 03/16/19  1653    History   Chief Complaint Chief Complaint  Patient presents with  . Loss of Consciousness  . Hypertension    HPI JAVONTAY VANDAM is a 63 y.o. male.     HPI  63 year old male presents with syncope.  The patient states that 2 days ago he was at a sports bar and felt cramping in his lower abdomen and legs.  He stood up to try and stretch out and immediately felt lightheaded and passed out.  Thinks he hit his head though he has no headache since.  Noticed his blood pressure was high yesterday and saw his doctor.  He has been feeling generally fatigued but otherwise not too bad.  Today in the afternoon he stood up quickly again after getting up from the couch and immediately felt lightheaded and passed out.  Never felt palpitations.  Since then he has had a slight right-sided headache but no significant or sudden onset headache.  No chest pain, palpitations, shortness of breath.  No vomiting or diarrhea.  He is on blood pressure medicine and was told to take an extra blood pressure medicine pill yesterday.  Past Medical History:  Diagnosis Date  . Epididymitis   . GERD (gastroesophageal reflux disease)   . Gout of big toe 05/21/2013  . Hard of hearing   . History of gout   . Hx of adenomatous polyp of colon 02/01/2017  . Hypertension   . Orchitis   . Sepsis [facility]) hospitalized 12/25/2015    Patient Active Problem List   Diagnosis Date Noted  . History of chronic back pain 01/27/2019  . Urethral stricture in male 01/27/2019  . Hx of adenomatous polyp of colon 02/01/2017  . Headache   . Elevated bilirubin   . Gout of big toe 05/21/2013  . Hypertension 11/03/2012    Past Surgical History:  Procedure Laterality Date  . CYSTOSCOPY WITH DIRECT VISION INTERNAL URETHROTOMY N/A 01/25/2016   Procedure:  DIRECT VISION INTERNAL URETHROTOMY;  Surgeon: Ardis Hughs, MD;   Location: [facility];  Service: Urology;  Laterality: N/A;  . CYSTOSCOPY WITH RETROGRADE URETHROGRAM N/A 01/25/2016   Procedure:  RETROGRADE URETHROGRAM;  Surgeon: Ardis Hughs, MD;  Location: [facility];  Service: Urology;  Laterality: N/A;  . INGUINAL HERNIA REPAIR Right   . WISDOM TOOTH EXTRACTION          Home Medications    Prior to Admission medications   Medication Sig Start Date End Date Taking? Authorizing Provider  amLODipine (NORVASC) 5 MG tablet TAKE 1 TABLET BY MOUTH ONCE DAILY 06/04/18  Yes Marletta Lor, MD  colchicine (COLCRYS) 0.6 MG tablet Take 1 tablet (0.6 mg total) by mouth 2 (two) times daily. 05/28/18  Yes Marletta Lor, MD  losartan (COZAAR) 100 MG tablet Take 1 tablet (100 mg total) by mouth daily. 05/18/18  Yes Marletta Lor, MD  Multiple Vitamins-Minerals (MENS 50+ MULTI VITAMIN/MIN PO) Take 1 tablet by mouth daily.   Yes [provider]  ciprofloxacin (CIPRO) 500 MG tablet Take 1 tablet (500 mg total) by mouth 2 (two) times daily. Patient not taking: Reported on 03/16/2019 01/22/19   [name], MD  diclofenac sodium (VOLTAREN) 1 % GEL Apply 2 g topically 4 (four) times daily. Patient not taking: Reported on 03/16/2019 10/08/17   [name], MD  HYDROcodone-acetaminophen (NORCO) 10-325 MG tablet Take 1  tablet by mouth every 6 (six) hours as needed. Patient not taking: Reported on 03/16/2019 05/18/18   Marletta Lor, MD  pantoprazole (PROTONIX) 40 MG tablet Take 1 tablet (40 mg total) by mouth daily. Patient not taking: Reported on 03/16/2019 05/18/18   Marletta Lor, MD    Family History Family History  Problem Relation Age of Onset  . Diabetes Mellitus II Paternal Grandfather   . Throat cancer Sister   . Colon cancer Neg Hx   . Esophageal cancer Neg Hx   . Pancreatic cancer Neg Hx   . Prostate cancer Neg Hx   . Rectal cancer Neg Hx   . Stomach cancer Neg Hx     Social History Social History   Tobacco  Use  . Smoking status: Never Smoker  . Smokeless tobacco: Never Used  Substance Use Topics  . Alcohol use: Yes    Alcohol/week: 6.0 standard drinks    Types: 6 Glasses of wine per week  . Drug use: No     Allergies   Lisinopril   Review of Systems Review of Systems  Constitutional: Negative for fever.  Respiratory: Negative for shortness of breath.   Cardiovascular: Negative for chest pain and palpitations.  Gastrointestinal: Negative for abdominal pain, diarrhea and vomiting.  Neurological: Positive for syncope, light-headedness and headaches.  All other systems reviewed and are negative.    Physical Exam Updated Vital Signs BP (!) 141/90 (BP Location: Left Arm)   Pulse 63   Temp 98.3 F (36.8 C) (Oral)   Resp 18   Ht 5\' 9"  (1.753 m)   Wt 104.3 kg   SpO2 97%   BMI 33.97 kg/m   Physical Exam Vitals signs and nursing note reviewed.  Constitutional:      General: He is not in acute distress.    Appearance: He is well-developed. He is not ill-appearing or diaphoretic.  HENT:     Head: Normocephalic and atraumatic.     Right Ear: External ear normal.     Left Ear: External ear normal.     Nose: Nose normal.  Eyes:     General:        Right eye: No discharge.        Left eye: No discharge.     Extraocular Movements: Extraocular movements intact.     Pupils: Pupils are equal, round, and reactive to light.  Neck:     Musculoskeletal: Neck supple.  Cardiovascular:     Rate and Rhythm: Normal rate and regular rhythm.     Heart sounds: Normal heart sounds. No murmur.  Pulmonary:     Effort: Pulmonary effort is normal.     Breath sounds: Normal breath sounds.  Abdominal:     Palpations: Abdomen is soft.     Tenderness: There is no abdominal tenderness.  Skin:    General: Skin is warm and dry.  Neurological:     Mental Status: He is alert.     Comments: CN 3-12 grossly intact. 5/5 strength in all 4 extremities. Grossly normal sensation. Normal finger to nose.    Psychiatric:        Mood and Affect: Mood is not anxious.      ED Treatments / Results  Labs (all labs ordered are listed, but only abnormal results are displayed) Labs Reviewed  BASIC METABOLIC PANEL - Abnormal; Notable for the following components:      Result Value   Glucose, Bld 102 (*)    Calcium 8.8 (*)  All other components within normal limits  CBC - Abnormal; Notable for the following components:   WBC 3.9 (*)    Platelets 140 (*)    All other components within normal limits  URINALYSIS, ROUTINE W REFLEX MICROSCOPIC - Abnormal; Notable for the following components:   Leukocytes,Ua SMALL (*)    Bacteria, UA MANY (*)    All other components within normal limits  CK - Abnormal; Notable for the following components:   Total CK 680 (*)    All other components within normal limits  URINE CULTURE  CBG MONITORING, ED    EKG EKG Interpretation  Date/Time:  Tuesday March 16 2019 17:12:33 EDT Ventricular Rate:  69 PR Interval:    QRS Duration: 85 QT Interval:  394 QTC Calculation: 423 R Axis:     Text Interpretation:  Sinus rhythm no acute ST/T changes no significant change since 2017 Confirmed by Sherwood Gambler [phone]) on 03/16/2019 5:14:53 PM   Radiology No results found.  Procedures Procedures (including critical care time)  Medications Ordered in ED Medications  fosfomycin (MONUROL) packet 3 g (has no administration in time range)  sodium chloride flush (NS) 0.9 % injection 3 mL (3 mLs Intravenous Given 03/16/19 2037)  sodium chloride 0.9 % bolus 1,000 mL (1,000 mLs Intravenous New Bag/Given 03/16/19 2036)  sodium chloride 0.9 % bolus 1,000 mL (1,000 mLs Intravenous New Bag/Given 03/16/19 2037)     Initial Impression / Assessment and Plan / ED Course  I have reviewed the triage vital signs and the nursing notes.  Pertinent labs & imaging results that were available during my care of the patient were reviewed by me and considered in my medical decision  making (see chart for details).        Patient's syncope appeared to occur when standing up quickly.  While he did not significantly drop his blood pressure, he had a recurrence of lightheadedness here in the ER with orthostatics.  His labs are overall reassuring.  He asked for a UA to be checked because he is had UTIs before and intermittently self catheterizes.  Given the many bacteria and WBCs, will treat with fosfomycin for what otherwise appears to be a likely bladder infection.  He has no flank pain or vomiting.  I doubt his syncope is due to systemic illness.  He has been having muscle aches on and off for a while though it seemed a little worse a couple days ago.  CK is in a borderline area of is around 600.  Does not seem consistent with rhabdomyolysis.  After fluids he will be discharged and counseled on standing up slower and drinking plenty of fluids.  Follow-up with PCP.  Final Clinical Impressions(s) / ED Diagnoses   Final diagnoses:  Orthostatic syncope  Acute UTI    ED Discharge Orders    None       Sherwood Gambler, MD 03/16/19 2147

## 2019-03-16 NOTE — ED Notes (Signed)
Discharge instructions reviewed. Opportunity for questions allowed. Patient stable, A&O x4 and ambulatory at discharge.

## 2019-03-17 ENCOUNTER — Ambulatory Visit (INDEPENDENT_AMBULATORY_CARE_PROVIDER_SITE_OTHER): Payer: BC Managed Care – PPO | Admitting: Family Medicine

## 2019-03-17 DIAGNOSIS — R55 Syncope and collapse: Secondary | ICD-10-CM

## 2019-03-17 NOTE — Progress Notes (Signed)
Patient ID: [name], male   DOB: 1955-11-02, 63 y.o.   MRN: [id number]  This visit type was conducted due to national recommendations for restrictions regarding the COVID-19 pandemic in an effort to limit this patient's exposure and mitigate transmission in our community.   Virtual Visit via Telephone Note  I connected with [name] on 03/17/19 at  4:15 PM EDT by telephone and verified that I am speaking with the correct person using two identifiers.   I discussed the limitations, risks, security and privacy concerns of performing an evaluation and management service by telephone and the availability of in person appointments. I also discussed with the patient that there may be a patient responsible charge related to this service. The patient expressed understanding and agreed to proceed.  Location patient: home Location provider: work or home office Participants present for the call: patient, provider Patient did not have a visit in the prior 7 days to address this/these issue(s).   History of Present Illness:  Patient had recurrent episode of syncope.   For details regarding recent syncopal episode, refer to our recent note from a couple days ago   Patient called with syncopal episode last night around 8 PM when he was eating at a restaurant.  He states that he had been outside much of the day at church in the heat but did not recall any dizziness when he went to the restaurant.  He was sitting at the table when he noticed a severe abdominal cramp like a muscle cramp and when he stood up to try to walk this out he found himself on the floor.  He came back around promptly.  There was no suspicion of any seizure type activity.  He did have an alcoholic beverage prior to passing out.  No chest pains.  No palpitations.  No confusion.  No fever.  No dyspnea.  No focal weakness or slurred speech.   Patient then presented to the ER yesterday after second episode.  His initial episode  occurred at a sports bar after he felt some cramping lower abdomen and legs and stood up to stretch out and passed out.  He stood up quickly after getting up from the couch yesterday and felt lightheaded and passed out.  No palpitations.  No chest pain.  No headache today.  He apparently had orthostatics in the ER that did not show any drop in blood pressure though he did have symptoms when he stood up.  He received some IV fluids and there was concern he may be dehydrated.  His urinalysis suggested recurrent UTI.  He had many bacteria.  Culture pending.  Patient treated with fosfomycin.  He had slightly elevated total CK of 680.  Low suspicion for rhabdomyolysis.  Electrolytes and CBC stable.  He has not had any recurrent syncope since then.  He had EKG in the ER that was unremarkable.  No history of seizure activity.  No post ictal type symptoms.    Observations/Objective: Patient sounds cheerful and well on the phone. I do not appreciate any SOB. Speech and thought processing are grossly intact. Patient reported vitals:  Assessment and Plan:  Recurrent syncope and in a patient with history of hypertension and gout.  Etiology unclear. ?neurogenic/autonomic orthostatic hypotension.  Did not have documented orthostatic type drops in the ER.  We recommend the following  -Follow-up on urine culture results -Set up 2D echocardiogram and cardiac event monitor to further assess -Consider referral to cardiology EP specialist for  any recurrent syncope -stay well hydrated.  Follow Up Instructions:  As above   99441 5-10 99442 11-20 99443 21-30 I did not refer this patient for an OV in the next 24 hours for this/these issue(s).  I discussed the assessment and treatment plan with the patient. The patient was provided an opportunity to ask questions and all were answered. The patient agreed with the plan and demonstrated an understanding of the instructions.   The patient was advised to call back  or seek an in-person evaluation if the symptoms worsen or if the condition fails to improve as anticipated.  I provided 25 minutes of non-face-to-face time during this encounter.   Carolann Littler, MD

## 2019-03-18 LAB — URINE CULTURE: Culture: >=100000 — AB

## 2019-03-19 ENCOUNTER — Telehealth: Payer: Self-pay | Admitting: *Deleted

## 2019-03-19 NOTE — Telephone Encounter (Signed)
Post ED Visit - Positive Culture Follow-up  Culture report reviewed by antimicrobial stewardship pharmacist: [place] Team []  Elenor Quinones, Pharm.D. []  Heide Guile, Pharm.D., BCPS AQ-ID []  Parks Neptune, Pharm.D., BCPS []  Alycia Rossetti, Pharm.D., BCPS []  [place], Pharm.D., BCPS, AAHIVP []  Legrand Como, Pharm.D., BCPS, AAHIVP [x]  Salome Arnt, PharmD, BCPS []  Johnnette Gourd, PharmD, BCPS []  Hughes Better, PharmD, BCPS []  Leeroy Cha, PharmD []  Laqueta Linden, PharmD, BCPS []  Albertina Parr, PharmD  [place] Team []  Leodis Sias, PharmD []  Lindell Spar, PharmD []  Royetta Asal, PharmD []  Graylin Shiver, Rph []  Rema Fendt) Glennon Mac, PharmD []  Arlyn Dunning, PharmD []  Netta Cedars, PharmD []  Dia Sitter, PharmD []  Leone Haven, PharmD []  Gretta Arab, PharmD []  Theodis Shove, PharmD []  Peggyann Juba, PharmD []  Reuel Boom, PharmD   Positive urine culture Treated with Fosfomysin in the ED, organism sensitive to the same and no further patient follow-up is required at this time.  Harlon Flor [facility] 03/19/2019, 9:31 AM

## 2019-03-23 ENCOUNTER — Telehealth: Payer: Self-pay | Admitting: *Deleted

## 2019-03-23 NOTE — Telephone Encounter (Signed)
[employer] to ship 30 day cardiac event monitor to his home.  Instructions reviewed briefly as they are included in the monitor kit. 

## 2019-03-29 ENCOUNTER — Other Ambulatory Visit: Payer: Self-pay

## 2019-03-29 ENCOUNTER — Ambulatory Visit (HOSPITAL_COMMUNITY): Payer: BC Managed Care – PPO | Attending: Internal Medicine

## 2019-03-29 DIAGNOSIS — R55 Syncope and collapse: Secondary | ICD-10-CM | POA: Diagnosis not present

## 2019-03-31 ENCOUNTER — Ambulatory Visit (INDEPENDENT_AMBULATORY_CARE_PROVIDER_SITE_OTHER): Payer: BC Managed Care – PPO

## 2019-03-31 DIAGNOSIS — R55 Syncope and collapse: Secondary | ICD-10-CM | POA: Diagnosis not present

## 2019-04-13 ENCOUNTER — Ambulatory Visit (INDEPENDENT_AMBULATORY_CARE_PROVIDER_SITE_OTHER): Payer: BC Managed Care – PPO | Admitting: Family Medicine

## 2019-04-13 ENCOUNTER — Other Ambulatory Visit: Payer: Self-pay

## 2019-04-13 DIAGNOSIS — Z87440 Personal history of urinary (tract) infections: Secondary | ICD-10-CM | POA: Diagnosis not present

## 2019-04-13 DIAGNOSIS — R42 Dizziness and giddiness: Secondary | ICD-10-CM

## 2019-04-13 DIAGNOSIS — I10 Essential (primary) hypertension: Secondary | ICD-10-CM

## 2019-04-13 DIAGNOSIS — R55 Syncope and collapse: Secondary | ICD-10-CM | POA: Diagnosis not present

## 2019-04-13 DIAGNOSIS — R109 Unspecified abdominal pain: Secondary | ICD-10-CM

## 2019-04-13 NOTE — Progress Notes (Signed)
Patient ID: [name], male   DOB: 12-Feb-1956, 63 y.o.   MRN: [id number]  This visit type was conducted due to national recommendations for restrictions regarding the COVID-19 pandemic in an effort to limit this patient's exposure and mitigate transmission in our community.   Virtual Visit via Telephone Note  I connected with [name] on 04/13/19 at  1:45 PM EDT by telephone and verified that I am speaking with the correct person using two identifiers.   I discussed the limitations, risks, security and privacy concerns of performing an evaluation and management service by telephone and the availability of in person appointments. I also discussed with the patient that there may be a patient responsible charge related to this service. The patient expressed understanding and agreed to proceed.  Location patient: home Location provider: work or home office Participants present for the call: patient, provider Patient did not have a visit in the prior 7 days to address this/these issue(s).   History of Present Illness: Patient has chronic problems including hypertension, gout, history of urethral stricture.  He had called with several issues as follows  He had recent syncopal episode and was scheduled for event monitor.  He states that he had some reaction to the pads with some burning of the skin and itching.  They have improved some since removal of the monitor.  He is using some topical steroid cream for that.  States his blood pressures been elevated up in the 956L range systolic and around 80 diastolic.  He takes amlodipine 5 mg daily and losartan 100 mg daily.  Compliant with medications.  Denies any recent chest pains.  He still has some episodes of dizziness where he feels as if he may pass out.  These are not clearly positional.  He also relates some intermittent muscle cramps.  Recent electrolytes were unremarkable.  He did have recent E. coli UTI.  He states his urine is very dark  in color but denies any burning with urination currently.  No fever.  He had recent electrolytes and CBC through ER which were unremarkable.  EKG unremarkable.  He had recent echocardiogram which showed no obvious cause for syncope.  Normal ejection fraction no major valve problems.   Observations/Objective: Patient sounds cheerful and well on the phone. I do not appreciate any SOB. Speech and thought processing are grossly intact. Patient reported vitals:  Assessment and Plan:  #1 hypertension poorly controlled by home readings -Bring in the office tomorrow to recheck and adjust medications if necessary  #2 local skin reaction from adhesive pads -Continue over-the-counter topical hydrocortisone cream  #3 history of recent dizziness and recent syncopal episodes.  Echocardiogram unremarkable.  Event monitor pending -Consider cardiology referral for persistent episodes  #4 history of recurrent UTI with risk factor of urethral stricture  Follow Up Instructions:  -We are trying to set up follow-up tomorrow to further assess   99441 5-10 99442 11-20 99443 21-30 I did not refer this patient for an OV in the next 24 hours for this/these issue(s).  I discussed the assessment and treatment plan with the patient. The patient was provided an opportunity to ask questions and all were answered. The patient agreed with the plan and demonstrated an understanding of the instructions.   The patient was advised to call back or seek an in-person evaluation if the symptoms worsen or if the condition fails to improve as anticipated.  I provided 22 minutes of non-face-to-face time during this encounter.   [name]  Maurice Hashimoto, MD

## 2019-04-14 ENCOUNTER — Encounter: Payer: Self-pay | Admitting: Family Medicine

## 2019-04-14 ENCOUNTER — Other Ambulatory Visit: Payer: Self-pay

## 2019-04-14 ENCOUNTER — Ambulatory Visit (INDEPENDENT_AMBULATORY_CARE_PROVIDER_SITE_OTHER): Payer: BC Managed Care – PPO | Admitting: Family Medicine

## 2019-04-14 VITALS — BP 128/70 | HR 84 | Temp 98.0°F | Ht 69.0 in | Wt 233.2 lb

## 2019-04-14 DIAGNOSIS — N390 Urinary tract infection, site not specified: Secondary | ICD-10-CM | POA: Diagnosis not present

## 2019-04-14 DIAGNOSIS — R5383 Other fatigue: Secondary | ICD-10-CM

## 2019-04-14 DIAGNOSIS — R252 Cramp and spasm: Secondary | ICD-10-CM | POA: Diagnosis not present

## 2019-04-14 LAB — POCT URINALYSIS DIPSTICK
Bilirubin, UA: NEGATIVE
Blood, UA: NEGATIVE
Glucose, UA: NEGATIVE
Ketones, UA: NEGATIVE
Nitrite, UA: POSITIVE
Protein, UA: NEGATIVE
Spec Grav, UA: 1.015 (ref 1.010–1.025)
Urobilinogen, UA: 0.2 E.U./dL
pH, UA: 6 (ref 5.0–8.0)

## 2019-04-14 MED ORDER — PANTOPRAZOLE SODIUM 40 MG PO TBEC: 40.0000 mg | DELAYED_RELEASE_TABLET | Freq: Every day | ORAL | 3 refills | Status: DC

## 2019-04-14 NOTE — Progress Notes (Signed)
Subjective:     Patient ID: [name], male   DOB: 10-04-1955, 63 y.o.   MRN: [id number]  HPI Patient is seen for further evaluation.  Refer to note from yesterday for some further details regarding his recent issues  "Patient has chronic problems including hypertension, gout, history of urethral stricture.  He had called with several issues as follows  He had recent syncopal episode and was scheduled for event monitor.  He states that he had some reaction to the pads with some burning of the skin and itching.  They have improved some since removal of the monitor.  He is using some topical steroid cream for that.  States his blood pressures been elevated up in the 409W range systolic and around 80 diastolic.  He takes amlodipine 5 mg daily and losartan 100 mg daily.  Compliant with medications.  Denies any recent chest pains.  He still has some episodes of dizziness where he feels as if he may pass out.  These are not clearly positional.  He also relates some intermittent muscle cramps.  Recent electrolytes were unremarkable.  He did have recent E. coli UTI.  He states his urine is very dark in color but denies any burning with urination currently.  No fever.  He had recent electrolytes and CBC through ER which were unremarkable.  EKG unremarkable.  He had recent echocardiogram which showed no obvious cause for syncope.  Normal ejection fraction no major valve problems."  He feels he is hydrating better now.  He has had no issues with exercise tolerance.  He does have some general fatigue and occasional lightheadedness.  He feels that severe abdominal muscle cramps seem to be triggering some of his dizzy episodes.  No clear provocation.  He had recent electrolytes including magnesium that were normal.  Past Medical History:  Diagnosis Date  . Epididymitis   . GERD (gastroesophageal reflux disease)   . Gout of big toe 05/21/2013  . Hard of hearing   . History of gout   . Hx of  adenomatous polyp of colon 02/01/2017  . Hypertension   . Orchitis   . Sepsis [facility]) hospitalized 12/25/2015   Past Surgical History:  Procedure Laterality Date  . CYSTOSCOPY WITH DIRECT VISION INTERNAL URETHROTOMY N/A 01/25/2016   Procedure:  DIRECT VISION INTERNAL URETHROTOMY;  Surgeon: Ardis Hughs, MD;  Location: [facility];  Service: Urology;  Laterality: N/A;  . CYSTOSCOPY WITH RETROGRADE URETHROGRAM N/A 01/25/2016   Procedure:  RETROGRADE URETHROGRAM;  Surgeon: Ardis Hughs, MD;  Location: [facility];  Service: Urology;  Laterality: N/A;  . INGUINAL HERNIA REPAIR Right   . WISDOM TOOTH EXTRACTION      reports that he has never smoked. He has never used smokeless tobacco. He reports current alcohol use of about 6.0 standard drinks of alcohol per week. He reports that he does not use drugs. family history includes Diabetes Mellitus II in his paternal grandfather; Throat cancer in his sister. Allergies  Allergen Reactions  . Lisinopril Cough      Review of Systems  Constitutional: Negative for fatigue.  Eyes: Negative for visual disturbance.  Respiratory: Negative for cough, chest tightness and shortness of breath.   Cardiovascular: Negative for chest pain, palpitations and leg swelling.  Gastrointestinal: Negative for abdominal distention, blood in stool, constipation, diarrhea, nausea and vomiting.  Neurological: Positive for dizziness and light-headedness. Negative for weakness and headaches.  Hematological: Negative for adenopathy.  Psychiatric/Behavioral: Negative for confusion and dysphoric mood.  Objective:   Physical Exam Constitutional:      Appearance: He is well-developed.  Cardiovascular:     Rate and Rhythm: Normal rate and regular rhythm.  Pulmonary:     Effort: Pulmonary effort is normal.     Breath sounds: Normal breath sounds.  Abdominal:     General: Abdomen is flat. Bowel sounds are normal.     Palpations: Abdomen is soft.     Tenderness: There  is no abdominal tenderness. There is no guarding or rebound.  Neurological:     General: No focal deficit present.     Mental Status: He is alert and oriented to person, place, and time.     Cranial Nerves: No cranial nerve deficit.        Assessment:     #1 hypertension.  Well controlled by today's readings.  Left arm seated large cuff 120/78 and standing 118/70  #2 history of recent syncope.  Question vasovagal.  This seems to occur in the setting of abdominal muscle cramping.  Echocardiogram unremarkable.  Event monitor pending  #3 recurrent abdominal muscle cramps-etiology unclear.  #4 history of recurrent UTI.  Risk factor of urethral stricture and I/O caths periodically.  #5 general fatigue issues.  He has good exercise tolerance.  Weight and appetite are stable.  Chronic PPI use- rule out B12 deficiency.    Plan:     -Check repeat urinalysis and cx if indicated -Check other labs including TSH, C00, basic metabolic panel, magnesium level -Stressed importance of adequate hydration -Consider cardiology referral for any recurrent syncope  [name] MD [facility]

## 2019-04-15 LAB — BASIC METABOLIC PANEL
BUN: 16 mg/dL (ref 6–23)
CO2: 25 mEq/L (ref 19–32)
Calcium: 9.1 mg/dL (ref 8.4–10.5)
Chloride: 104 mEq/L (ref 96–112)
Creatinine, Ser: 1.33 mg/dL (ref 0.40–1.50)
GFR: 65.96 mL/min (ref >60.00)
Glucose, Bld: 82 mg/dL (ref 70–99)
Potassium: 4.4 mEq/L (ref 3.5–5.1)
Sodium: 136 mEq/L (ref 135–145)

## 2019-04-15 LAB — TSH: TSH: 1.63 u[IU]/mL (ref 0.35–4.50)

## 2019-04-15 LAB — MAGNESIUM: Magnesium: 2.1 mg/dL (ref 1.5–2.5)

## 2019-04-15 LAB — VITAMIN B12: Vitamin B-12: 213 pg/mL (ref 211–911)

## 2019-04-16 MED ORDER — NITROFURANTOIN MONOHYD MACRO 100 MG PO CAPS: 100.0000 mg | ORAL_CAPSULE | Freq: Two times a day (BID) | ORAL | 0 refills | Status: DC

## 2019-04-16 NOTE — Addendum Note (Signed)
Addended by: [name] on: 04/16/2019 05:17 PM   Modules accepted: Orders

## 2019-04-17 LAB — URINE CULTURE
MICRO NUMBER:: 694172
SPECIMEN QUALITY:: ADEQUATE

## 2019-05-17 DIAGNOSIS — B9620 Unspecified Escherichia coli [E. coli] as the cause of diseases classified elsewhere: Secondary | ICD-10-CM | POA: Diagnosis not present

## 2019-05-17 DIAGNOSIS — N35011 Post-traumatic bulbous urethral stricture: Secondary | ICD-10-CM | POA: Diagnosis not present

## 2019-05-17 DIAGNOSIS — R338 Other retention of urine: Secondary | ICD-10-CM | POA: Diagnosis not present

## 2019-05-17 DIAGNOSIS — N390 Urinary tract infection, site not specified: Secondary | ICD-10-CM | POA: Diagnosis not present

## 2019-05-19 DIAGNOSIS — N35011 Post-traumatic bulbous urethral stricture: Secondary | ICD-10-CM | POA: Diagnosis not present

## 2019-05-19 DIAGNOSIS — R339 Retention of urine, unspecified: Secondary | ICD-10-CM | POA: Diagnosis not present

## 2019-05-25 DIAGNOSIS — N35011 Post-traumatic bulbous urethral stricture: Secondary | ICD-10-CM | POA: Diagnosis not present

## 2019-06-08 DIAGNOSIS — R339 Retention of urine, unspecified: Secondary | ICD-10-CM | POA: Diagnosis not present

## 2019-06-08 DIAGNOSIS — N35011 Post-traumatic bulbous urethral stricture: Secondary | ICD-10-CM | POA: Diagnosis not present

## 2019-06-09 DIAGNOSIS — N35011 Post-traumatic bulbous urethral stricture: Secondary | ICD-10-CM | POA: Diagnosis not present

## 2019-08-11 ENCOUNTER — Other Ambulatory Visit: Payer: Self-pay | Admitting: Family Medicine

## 2019-08-11 MED ORDER — LOSARTAN POTASSIUM 100 MG PO TABS: 100.0000 mg | ORAL_TABLET | Freq: Every day | ORAL | 3 refills | Status: DC

## 2019-08-11 MED ORDER — AMLODIPINE BESYLATE 5 MG PO TABS: 5.0000 mg | ORAL_TABLET | Freq: Every day | ORAL | 0 refills | Status: DC

## 2019-08-11 NOTE — Telephone Encounter (Signed)
Medication Refill - Medication: amLODipine (NORVASC) 5 MG tablet  losartan (COZAAR) 100 MG tablet   Has the patient contacted their pharmacy? Yes.   (Agent: If no, request that the patient contact the pharmacy for the refill.) (Agent: If yes, when and what did the pharmacy advise?)  Preferred Pharmacy (with phone number or street name): [facility] [place], [address] [place]: Please be advised that RX refills may take up to 3 business days. We ask that you follow-up with your pharmacy.

## 2019-08-12 DIAGNOSIS — R339 Retention of urine, unspecified: Secondary | ICD-10-CM | POA: Diagnosis not present

## 2019-08-12 DIAGNOSIS — N35011 Post-traumatic bulbous urethral stricture: Secondary | ICD-10-CM | POA: Diagnosis not present

## 2019-09-01 ENCOUNTER — Other Ambulatory Visit: Payer: Self-pay | Admitting: Family Medicine

## 2019-09-01 MED ORDER — COLCHICINE 0.6 MG PO TABS: 0.6000 mg | ORAL_TABLET | Freq: Two times a day (BID) | ORAL | 2 refills | Status: DC

## 2019-09-01 NOTE — Telephone Encounter (Signed)
Medication Refill - Medication:  colchicine (COLCRYS) 0.6 MG tablet   Has the patient contacted their pharmacy? Yes advised to call office. Pt called in stating pharmacy sent request for this.  Preferred Pharmacy (with phone number or street name):  [facility] [phone] - [place], [address] AT [address] [phone] (Phone) [phone] (Fax)   Agent: Please be advised that RX refills may take up to 3 business days. We ask that you follow-up with your pharmacy.

## 2019-09-01 NOTE — Telephone Encounter (Signed)
Requested medication (s) are due for refill today  Requested medication (s) are on the active medication list: yes  Last refill:  05/28/2018  Future visit scheduled:no  Notes to clinic: review for refill Last filled by different provider    Requested Prescriptions  Pending Prescriptions Disp Refills   colchicine (COLCRYS) 0.6 MG tablet 20 tablet 2    Sig: Take 1 tablet (0.6 mg total) by mouth 2 (two) times daily.     Endocrinology:  Gout Agents Failed - 09/01/2019 12:09 PM      Failed - Uric Acid in normal range and within 360 days    Uric Acid, Serum  Date Value Ref Range Status  05/21/2013 7.8 4.0 - 7.8 mg/dL Final         Passed - Cr in normal range and within 360 days    Creatinine, Ser  Date Value Ref Range Status  04/14/2019 1.33 0.40 - 1.50 mg/dL Final         Passed - Valid encounter within last 12 months    Recent Outpatient Visits          4 months ago Muscle cramps   [facility], [place]   4 months ago Essential hypertension   [facility], Alinda Sierras, MD   5 months ago Recurrent syncope   [facility], Alinda Sierras, MD   5 months ago Syncope, unspecified syncope type   [facility], Alinda Sierras, MD   7 months ago Stricture of male urethra, unspecified stricture type   [facility], Alinda Sierras, MD

## 2019-09-06 ENCOUNTER — Telehealth: Payer: Self-pay | Admitting: Family Medicine

## 2019-09-06 NOTE — Telephone Encounter (Signed)
Message Routed to PCP CMA 

## 2019-09-06 NOTE — Telephone Encounter (Signed)
Please see message. Do you want a Doxy visit/telephone call set up since he is in [place]?

## 2019-09-06 NOTE — Telephone Encounter (Signed)
Copied from [place] [id number]. Topic: General - Other >> Sep 06, 2019  3:53 PM Keene Breath wrote: Reason for CRM: Patient is calling because he has a smell in his urine and he thinks he has a UTI.  Patient is in [place] and would like to know if he can get an antibiotic called in.  Please advise and call patient at [phone]

## 2019-09-06 NOTE — Telephone Encounter (Signed)
I have set up a telephone visit for 09/07/19 at 2:30pm. Called patient and patient verbalized an understanding.

## 2019-09-06 NOTE — Telephone Encounter (Signed)
Yes  sounds good.  Set up Doxy or phone follow up tomorrow.

## 2019-09-07 ENCOUNTER — Telehealth (INDEPENDENT_AMBULATORY_CARE_PROVIDER_SITE_OTHER): Payer: BC Managed Care – PPO | Admitting: Family Medicine

## 2019-09-07 ENCOUNTER — Other Ambulatory Visit: Payer: Self-pay

## 2019-09-07 DIAGNOSIS — R300 Dysuria: Secondary | ICD-10-CM

## 2019-09-07 MED ORDER — NITROFURANTOIN MONOHYD MACRO 100 MG PO CAPS: 100.0000 mg | ORAL_CAPSULE | Freq: Two times a day (BID) | ORAL | 0 refills | Status: DC

## 2019-09-07 NOTE — Progress Notes (Signed)
This visit type was conducted due to national recommendations for restrictions regarding the COVID-19 pandemic in an effort to limit this patient's exposure and mitigate transmission in our community.   Virtual Visit via Telephone Note  I connected with [name] on 09/07/19 at  2:30 PM EST by telephone and verified that I am speaking with the correct person using two identifiers.   I discussed the limitations, risks, security and privacy concerns of performing an evaluation and management service by telephone and the availability of in person appointments. I also discussed with the patient that there may be a patient responsible charge related to this service. The patient expressed understanding and agreed to proceed.  Location patient: home Location provider: work or home office Participants present for the call: patient, provider Patient did not have a visit in the prior 7 days to address this/these issue(s).   History of Present Illness: [name] has a history of urethral stricture and does periodic in and out caths.  History of recurrent UTI.  He has had about 3 E. coli UTIs in the past year.  He is currently out in [facility] visiting family.  He started couple days ago with some cloudy urine with increased odor.  Similar symptoms with infection in the past.  No fevers or chills yet.  No flank pain.  No nausea or vomiting.  He does have increased risk for resistance because of his in and out caths.  He had someone with a company review proper in and out technique   Observations/Objective: Patient sounds cheerful and well on the phone. I do not appreciate any SOB. Speech and thought processing are grossly intact. Patient reported vitals:  Assessment and Plan:  Recurrent dysuria.  High risk for urinary infection but also high risk for colonization.  He has had recent change of symptoms as above  -We agreed to sending in [place] 1 twice daily for 7 days.  He knows to  follow-up promptly with someone out there for further assessment if not promptly improving especially if he develops any fever  Follow Up Instructions:  -As above   99441 5-10 [id number] 11-20 99443 21-30 I did not refer this patient for an OV in the next 24 hours for this/these issue(s).  I discussed the assessment and treatment plan with the patient. The patient was provided an opportunity to ask questions and all were answered. The patient agreed with the plan and demonstrated an understanding of the instructions.   The patient was advised to call back or seek an in-person evaluation if the symptoms worsen or if the condition fails to improve as anticipated.  I provided 17 minutes of non-face-to-face time during this encounter.   Carolann Littler, MD

## 2019-09-08 ENCOUNTER — Telehealth: Payer: BC Managed Care – PPO | Admitting: Family Medicine

## 2019-11-14 ENCOUNTER — Emergency Department (HOSPITAL_COMMUNITY)
Admission: EM | Admit: 2019-11-14 | Discharge: 2019-11-14 | Disposition: A | Discharge status: Home / Self Care | Payer: 59 | Attending: Emergency Medicine | Admitting: Emergency Medicine

## 2019-11-14 ENCOUNTER — Other Ambulatory Visit: Payer: Self-pay

## 2019-11-14 ENCOUNTER — Encounter (HOSPITAL_COMMUNITY): Payer: Self-pay | Admitting: *Deleted

## 2019-11-14 DIAGNOSIS — R55 Syncope and collapse: Secondary | ICD-10-CM | POA: Diagnosis present

## 2019-11-14 DIAGNOSIS — I10 Essential (primary) hypertension: Secondary | ICD-10-CM | POA: Insufficient documentation

## 2019-11-14 DIAGNOSIS — Z79899 Other long term (current) drug therapy: Secondary | ICD-10-CM | POA: Insufficient documentation

## 2019-11-14 MED ORDER — SODIUM CHLORIDE 0.9 % IV BOLUS: 500.0000 mL | Freq: Once | INTRAVENOUS | Status: DC

## 2019-11-14 NOTE — ED Triage Notes (Signed)
Pt BIB EMS and presents from [facility]. Pt was about to go in and eat with his family. Pt has sudden onset full body cramps with hyperventilation.  On arrival, pt reports cramps in his feet and stomach. EMS gave pt 500 ml of NaCl through at 20g IV in the left forearm. EMS states that pt never became tachycardic and maintain a stable BP throughout transport. EMS's EKG was unremarkable. Pt a/o x 4.

## 2019-11-14 NOTE — Discharge Instructions (Signed)
Follow-up with your doctor for further evaluation of the urinary syncope.  Some of the lower abdominal cramping also potentially could be your bladder.

## 2019-11-14 NOTE — ED Provider Notes (Signed)
[facility] DEPT Provider Note   CSN: [id number] Arrival date & time: 11/14/19  1544     History Chief Complaint  Patient presents with  . Cramps    [name] is a 64 y.o. male.  HPI Patient presents with near syncopal episode.  States he was feeling a little crampiness abdomen.  Has had that for last couple days and has had this is a somewhat recurring issue.  Has been seen by PCP and in the ER for it.  States he had another episode today at [facility] felt lightheaded.  Began to cramp up all over his body then.  Feeling much better now.  Per EMS never was tachycardic and had good blood pressure.  Reviewing records had a syncopal episode with similar episode thought to be secondary to vagal episode.  Also has had some urinary issues in the past and times will self cath.    Past Medical History:  Diagnosis Date  . Epididymitis   . GERD (gastroesophageal reflux disease)   . Gout of big toe 05/21/2013  . Hard of hearing   . History of gout   . Hx of adenomatous polyp of colon 02/01/2017  . Hypertension   . Orchitis   . Sepsis [facility]) hospitalized 12/25/2015    Patient Active Problem List   Diagnosis Date Noted  . History of chronic back pain 01/27/2019  . Urethral stricture in male 01/27/2019  . Hx of adenomatous polyp of colon 02/01/2017  . Headache   . Elevated bilirubin   . Gout of big toe 05/21/2013  . Hypertension 11/03/2012    Past Surgical History:  Procedure Laterality Date  . CYSTOSCOPY WITH DIRECT VISION INTERNAL URETHROTOMY N/A 01/25/2016   Procedure:  DIRECT VISION INTERNAL URETHROTOMY;  Surgeon: Ardis Hughs, MD;  Location: [facility];  Service: Urology;  Laterality: N/A;  . CYSTOSCOPY WITH RETROGRADE URETHROGRAM N/A 01/25/2016   Procedure:  RETROGRADE URETHROGRAM;  Surgeon: Ardis Hughs, MD;  Location: [facility];  Service: Urology;  Laterality: N/A;  . INGUINAL HERNIA REPAIR Right   . WISDOM TOOTH EXTRACTION          Family History  Problem Relation Age of Onset  . Diabetes Mellitus II Paternal Grandfather   . Throat cancer Sister   . Colon cancer Neg Hx   . Esophageal cancer Neg Hx   . Pancreatic cancer Neg Hx   . Prostate cancer Neg Hx   . Rectal cancer Neg Hx   . Stomach cancer Neg Hx     Social History   Tobacco Use  . Smoking status: Never Smoker  . Smokeless tobacco: Never Used  Substance Use Topics  . Alcohol use: Yes    Alcohol/week: 6.0 standard drinks    Types: 6 Glasses of wine per week  . Drug use: No    Home Medications Prior to Admission medications   Medication Sig Start Date End Date Taking? Authorizing Provider  amLODipine (NORVASC) 5 MG tablet Take 1 tablet (5 mg total) by mouth daily. 08/11/19   Burchette, Alinda Sierras, MD  colchicine (COLCRYS) 0.6 MG tablet Take 1 tablet (0.6 mg total) by mouth 2 (two) times daily. 09/01/19   Burchette, Alinda Sierras, MD  diclofenac sodium (VOLTAREN) 1 % GEL Apply 2 g topically 4 (four) times daily. Patient not taking: Reported on 03/16/2019 10/08/17   [name], MD  losartan (COZAAR) 100 MG tablet Take 1 tablet (100 mg total) by mouth daily. 08/11/19  Burchette, Alinda Sierras, MD  Multiple Vitamins-Minerals (MENS 50+ MULTI VITAMIN/MIN PO) Take 1 tablet by mouth daily.    [provider]  nitrofurantoin, macrocrystal-monohydrate, (MACROBID) 100 MG capsule Take 1 capsule (100 mg total) by mouth 2 (two) times daily. 09/07/19   Burchette, Alinda Sierras, MD  pantoprazole (PROTONIX) 40 MG tablet Take 1 tablet (40 mg total) by mouth daily. 04/14/19   Burchette, Alinda Sierras, MD    Allergies    Lisinopril  Review of Systems   Review of Systems  Constitutional: Negative for appetite change and fever.  HENT: Negative for congestion.   Respiratory: Negative for shortness of breath.   Gastrointestinal: Positive for abdominal pain. Negative for rectal pain.  Genitourinary: Negative for flank pain.  Musculoskeletal: Negative for back pain.        Muscle cramps  Skin: Negative for rash.  Neurological: Positive for light-headedness.  Psychiatric/Behavioral: Negative for confusion.    Physical Exam Updated Vital Signs BP 119/83 (BP Location: Right Arm)   Pulse 80   Temp 98 F (36.7 C) (Oral)   Resp 18   SpO2 96%   Physical Exam Vitals and nursing note reviewed.  HENT:     Head: Normocephalic and atraumatic.  Eyes:     Extraocular Movements: Extraocular movements intact.  Cardiovascular:     Rate and Rhythm: Regular rhythm.  Pulmonary:     Breath sounds: No stridor. No wheezing or rhonchi.  Abdominal:     Tenderness: There is no abdominal tenderness.  Musculoskeletal:        General: No tenderness.  Skin:    General: Skin is warm.     Capillary Refill: Capillary refill takes less than 2 seconds.  Neurological:     Mental Status: He is alert and oriented to person, place, and time.     Comments: No hyperreflexia.     ED Results / Procedures / Treatments   Labs (all labs ordered are listed, but only abnormal results are displayed) Labs Reviewed  URINALYSIS, ROUTINE W REFLEX MICROSCOPIC  BASIC METABOLIC PANEL  CBC    EKG EKG Interpretation  Date/Time:  Sunday November 14 2019 16:29:52 EST Ventricular Rate:  83 PR Interval:    QRS Duration: 83 QT Interval:  364 QTC Calculation: 428 R Axis:   -2 Text Interpretation: Sinus rhythm No significant change since last tracing Confirmed by Davonna Belling [phone]) on 11/14/2019 4:45:04 PM   Radiology No results found.  Procedures Procedures (including critical care time)  Medications Ordered in ED Medications  sodium chloride 0.9 % bolus 500 mL (has no administration in time range)    ED Course  I have reviewed the triage vital signs and the nursing notes.  Pertinent labs & imaging results that were available during my care of the patient were reviewed by me and considered in my medical decision making (see chart for details).    MDM  Rules/Calculators/A&P                     Patient with near syncopal episode.  Began with abdominal cramps then may have extended.  Has had episodes of the same in the past.  Feels much better now.  Discussed with patient it could be another vagal episode like before but was complaining getting blood work.  Patient later told nursing that he was feeling better did not want more work-up.  Will discharge home.  Has been worked up for the same in the past.  Can follow-up PCP.  Although with the lower abdominal cramping and previous urologic issues could be bladder spasms or such causing similar symptoms. Final Clinical Impression(s) / ED Diagnoses Final diagnoses:  Near syncope    Rx / DC Orders ED Discharge Orders    None       Davonna Belling, MD 11/14/19 1645

## 2019-11-14 NOTE — ED Notes (Signed)
Pt states that he is feeling better and doesn't want a work up for his symptoms.  Dr. Alvino Chapel made aware.

## 2019-11-15 ENCOUNTER — Telehealth: Payer: Self-pay | Admitting: Family Medicine

## 2019-11-15 NOTE — Telephone Encounter (Signed)
Patient wants to speak to Dr. Erick Blinks nurse to see if he even needs to come in.  He went to ED by Ambulance for full body cramps, vision got blurry, couldn't move.  Been treated for cramps by Dr. [name] before.

## 2019-11-15 NOTE — Telephone Encounter (Signed)
Spoke with patient and he stated that he got really bad cramps all over his body while at [employer] . Cramps were so bad that he had to call ambulance to take him to the ER. Patient stated that his vision  got blurry at one point and he felt like he was going to pass out. Patient scheduled a telephone visit with PCP to discuss.

## 2019-11-15 NOTE — Telephone Encounter (Signed)
I recommend we try to get 30 minute office follow up.

## 2019-11-16 ENCOUNTER — Telehealth: Payer: Self-pay | Admitting: Family Medicine

## 2019-11-16 ENCOUNTER — Other Ambulatory Visit: Payer: Self-pay

## 2019-11-16 ENCOUNTER — Ambulatory Visit (INDEPENDENT_AMBULATORY_CARE_PROVIDER_SITE_OTHER): Payer: 59 | Admitting: Family Medicine

## 2019-11-16 ENCOUNTER — Encounter: Payer: Self-pay | Admitting: Family Medicine

## 2019-11-16 VITALS — BP 130/60 | HR 69 | Temp 97.8°F | Ht 69.0 in | Wt 230.0 lb

## 2019-11-16 DIAGNOSIS — M791 Myalgia, unspecified site: Secondary | ICD-10-CM

## 2019-11-16 DIAGNOSIS — R252 Cramp and spasm: Secondary | ICD-10-CM

## 2019-11-16 DIAGNOSIS — M7661 Achilles tendinitis, right leg: Secondary | ICD-10-CM | POA: Diagnosis not present

## 2019-11-16 MED ORDER — DICLOFENAC SODIUM 1 % EX GEL: 4.0000 g | Freq: Four times a day (QID) | CUTANEOUS | 1 refills | Status: DC

## 2019-11-16 NOTE — Progress Notes (Signed)
Subjective:     Patient ID: [name], male   DOB: Feb 28, 1956, 64 y.o.   MRN: [id number]  HPI   [name] has chronic problems including hypertension, gout, urethral stricture, chronic back pain.  He has had interesting issue where he has had sporadic episodes of severe diffuse muscle cramping.  He had a similar episode Sunday.  He woke up feeling somewhat lightheaded.  He went to church and started increasing hydration.  He also ate some bananas.  He had cramping which started with his extremities and were also involving abdominal muscles.  This was relatively sudden onset.  His symptoms were quite severe and after church he went out to eat lunch [employer].  When he was there he became diaphoretic and dizzy and had near syncopal episode after experiencing some severe abdominal cramps.  He called 911 and was taken to the ER.  Basically no significant evaluation was done there.  It was felt that he had likely vasovagal type symptoms accounting for his near-syncope.  He denies any chest pain.  He feels he is generally staying well-hydrated.  He has had previous evaluation including magnesium, calcium, potassium which have been unremarkable.  He does have low B12 level and is currently taking oral replacement for that.  He does not take any statins.  He has had history of low vitamin D but no recent levels.  Previous thyroid function tests have been normal  He does not describe any clear past hx of exercise/activity intolerance.    Patient also relates some right Achilles pain.  No injury.  He has some tenderness near the attachment of Achilles tendon to the calcaneus.  No recent change of shoewear.  No visible swelling.  Especially painful after prolonged periods of sitting when he first gets up  Past Medical History:  Diagnosis Date  . Epididymitis   . GERD (gastroesophageal reflux disease)   . Gout of big toe 05/21/2013  . Hard of hearing   . History of gout   . Hx of adenomatous polyp of colon  02/01/2017  . Hypertension   . Orchitis   . Sepsis [facility]) hospitalized 12/25/2015   Past Surgical History:  Procedure Laterality Date  . CYSTOSCOPY WITH DIRECT VISION INTERNAL URETHROTOMY N/A 01/25/2016   Procedure:  DIRECT VISION INTERNAL URETHROTOMY;  Surgeon: Ardis Hughs, MD;  Location: [facility];  Service: Urology;  Laterality: N/A;  . CYSTOSCOPY WITH RETROGRADE URETHROGRAM N/A 01/25/2016   Procedure:  RETROGRADE URETHROGRAM;  Surgeon: Ardis Hughs, MD;  Location: [facility];  Service: Urology;  Laterality: N/A;  . INGUINAL HERNIA REPAIR Right   . WISDOM TOOTH EXTRACTION      reports that he has never smoked. He has never used smokeless tobacco. He reports current alcohol use of about 6.0 standard drinks of alcohol per week. He reports that he does not use drugs. family history includes Diabetes Mellitus II in his paternal grandfather; Throat cancer in his sister. Allergies  Allergen Reactions  . Lisinopril Cough     Review of Systems  Constitutional: Negative for fatigue.  Eyes: Negative for visual disturbance.  Respiratory: Negative for cough, chest tightness and shortness of breath.   Cardiovascular: Negative for chest pain, palpitations and leg swelling.  Gastrointestinal: Negative for abdominal pain, nausea and vomiting.  Genitourinary: Negative for dysuria.  Musculoskeletal: Positive for myalgias.  Neurological: Negative for dizziness, syncope, weakness, light-headedness and headaches.  Hematological: Negative for adenopathy.       Objective:   Physical  Exam Vitals reviewed.  Constitutional:      Appearance: Normal appearance.  Cardiovascular:     Rate and Rhythm: Normal rate and regular rhythm.  Pulmonary:     Effort: Pulmonary effort is normal.     Breath sounds: Normal breath sounds.  Musculoskeletal:     Cervical back: Neck supple.     Right lower leg: No edema.     Left lower leg: No edema.     Comments: Right Achilles is unremarkable palpation other than  some tenderness near the attachment of the tendon to the calcaneus.  He has some mild retrocalcaneal tenderness as well.  He has full range of motion with plantarflexion and dorsiflexion.  Neurological:     General: No focal deficit present.     Mental Status: He is alert and oriented to person, place, and time.     Cranial Nerves: No cranial nerve deficit.        Assessment:     #1 intermittent severe muscle cramps.  The seem to come in paroxysms and are very infrequent but very diffuse and severe with occurrence.  He has never had any proven electrolyte abnormalities.  His only regular medications are amlodipine and losartan for hypertension.  He appears to be hydrating well.   #2 hypertension which is stable  #3 right Achilles tendinitis    Plan:     -We recommended intermittent icing of the right Achilles heel and also recommend topical diclofenac gel 1% 3-4 times daily  -Recommend check further labs with sed rate, CBC, comprehensive metabolic panel, creatinine kinase, B12, 25 hydroxy vitamin D  -consider electromyography to further assess.    -If above unrevealing consider trial of baclofen for future recurrence  [name] MD [facility]

## 2019-11-16 NOTE — Patient Instructions (Signed)
Rosen's Emergency Medicine: Concepts and Clinical Practice (9th ed., pp. 1392-1401). [place], [place]: [employer]. Retrieved from https://www.clinicalkey.com/#!/content/[email or web address]  Achilles Tendinitis  Achilles tendinitis is inflammation of the tough, cord-like band that attaches the lower leg muscles to the heel bone (Achilles tendon). This is usually caused by overusing the tendon and the ankle joint. Achilles tendinitis usually gets better over time with treatment and caring for yourself at home. It can take weeks or months to heal completely. What are the causes? This condition may be caused by:  A sudden increase in exercise or activity, such as running.  Doing the same exercises or activities, such as jumping, over and over.  Not warming up calf muscles before exercising.  Exercising in shoes that are worn out or not made for exercise.  Having arthritis or a bone growth (spur) on the back of the heel bone. This can rub against the tendon and hurt it.  Age-related wear and tear. Tendons become less flexible with age and are more likely to be injured. What are the signs or symptoms? Common symptoms of this condition include:  Pain in the Achilles tendon or in the back of the leg, just above the heel. The pain usually gets worse with exercise.  Stiffness or soreness in the back of the leg, especially in the morning.  Swelling of the skin over the Achilles tendon.  Thickening of the tendon.  Trouble standing on tiptoe. How is this diagnosed? This condition is diagnosed based on your symptoms and a physical exam. You may have tests, including:  X-rays.  MRI. How is this treated? The goal of treatment is to relieve symptoms and help your injury heal. Treatment may include:  Decreasing or stopping activities that caused the tendinitis. This may mean switching to low-impact exercises like biking or swimming.  Icing the injured  area.  Doing physical therapy, including strengthening and stretching exercises.  Taking NSAIDs, such as ibuprofen, to help relieve pain and swelling.  Using supportive shoes, wraps, heel lifts, or a walking boot (air cast).  Having surgery. This may be done if your symptoms do not improve after other treatments.  Using high-energy shock wave impulses to stimulate the healing process (extracorporeal shock wave therapy). This is rare.  Having an injection of medicines that help relieve inflammation (corticosteroids). This is rare. Follow these instructions at home: If you have an air cast:  Wear the air cast as told by your health care provider. Remove it only as told by your health care provider.  Loosen it if your toes tingle, become numb, or turn cold and blue.  Keep it clean.  If the air cast is not waterproof: ? Do not let it get wet. ? Cover it with a watertight covering when you take a bath or shower. Managing pain, stiffness, and swelling   If directed, put ice on the injured area. To do this: ? If you have a removable air cast, remove it as told by your health care provider. ? Put ice in a plastic bag. ? Place a towel between your skin and the bag. ? Leave the ice on for 20 minutes, 2-3 times a day.  Move your toes often to reduce stiffness and swelling.  Raise (elevate) your foot above the level of your heart while you are sitting or lying down. Activity  Gradually return to your normal activities as told by your health care provider. Ask your health care provider what activities are safe for you.  Do not do   activities that cause pain.  Consider doing low-impact exercises, like cycling or swimming.  Ask your health care provider when it is safe to drive if you have an air cast on your foot.  If physical therapy was prescribed, do exercises as told by your health care provider or physical therapist. General instructions  If directed, wrap your foot with an  elastic bandage or other wrap. This can help to keep your tendon from moving too much while it heals. Your health care provider will show you how to wrap your foot correctly.  Wear supportive shoes or heel lifts only as told by your health care provider.  Take over-the-counter and prescription medicines only as told by your health care provider.  Keep all follow-up visits as told by your health care provider. This is important. Contact a health care provider if you:  Have symptoms that get worse.  Have pain that does not get better with medicine.  Develop new, unexplained symptoms.  Develop warmth and swelling in your foot.  Have a fever. Get help right away if you:  Have a sudden popping sound or sensation in your Achilles tendon followed by severe pain.  Cannot move your toes or foot.  Cannot put any weight on your foot.  Your foot or toes become numb and look white or blue even after loosening your bandage or air cast. Summary  Achilles tendinitis is inflammation of the tough, cord-like band that attaches the lower leg muscles to the heel bone (Achilles tendon).  This condition is usually caused by overusing the tendon and the ankle joint. It can also be caused by arthritis or normal aging.  The most common symptoms of this condition include pain, swelling, or stiffness in the Achilles tendon or in the back of the leg.  This condition is usually treated by decreasing or stopping activities that caused the tendinitis, icing the injured area, taking NSAIDs, and doing physical therapy. This information is not intended to replace advice given to you by your health care provider. Make sure you discuss any questions you have with your health care provider. Document Revised: 01/25/2019 Document Reviewed: 01/25/2019 Elsevier Patient Education  2020 [employer].  

## 2019-11-16 NOTE — Telephone Encounter (Signed)
Left message for patient to come into office at 10:15 today for his appointment instead of a virtual visit.

## 2019-11-16 NOTE — Telephone Encounter (Signed)
Patient scheduled in office appointment for 4 pm today.

## 2019-11-17 ENCOUNTER — Other Ambulatory Visit (INDEPENDENT_AMBULATORY_CARE_PROVIDER_SITE_OTHER): Payer: 59

## 2019-11-17 ENCOUNTER — Other Ambulatory Visit: Payer: Self-pay

## 2019-11-17 DIAGNOSIS — M791 Myalgia, unspecified site: Secondary | ICD-10-CM | POA: Diagnosis not present

## 2019-11-17 LAB — COMPREHENSIVE METABOLIC PANEL
ALT: 55 U/L — ABNORMAL HIGH (ref 0–53)
AST: 46 U/L — ABNORMAL HIGH (ref 0–37)
Albumin: 4.1 g/dL (ref 3.5–5.2)
Alkaline Phosphatase: 37 U/L — ABNORMAL LOW (ref 39–117)
BUN: 16 mg/dL (ref 6–23)
CO2: 22 mEq/L (ref 19–32)
Calcium: 9 mg/dL (ref 8.4–10.5)
Chloride: 104 mEq/L (ref 96–112)
Creatinine, Ser: 1.17 mg/dL (ref 0.40–1.50)
GFR: 76.32 mL/min (ref >60.00)
Glucose, Bld: 111 mg/dL — ABNORMAL HIGH (ref 70–99)
Potassium: 4 mEq/L (ref 3.5–5.1)
Sodium: 135 mEq/L (ref 135–145)
Total Bilirubin: 0.4 mg/dL (ref 0.2–1.2)
Total Protein: 7.5 g/dL (ref 6.0–8.3)

## 2019-11-17 LAB — CBC WITH DIFFERENTIAL/PLATELET
Basophils Absolute: 0 10*3/uL (ref 0.0–0.1)
Basophils Relative: 0.8 % (ref 0.0–3.0)
Eosinophils Absolute: 0.1 10*3/uL (ref 0.0–0.7)
Eosinophils Relative: 3.6 % (ref 0.0–5.0)
HCT: 42.3 % (ref 39.0–52.0)
Hemoglobin: 14.1 g/dL (ref 13.0–17.0)
Lymphocytes Relative: 46.5 % — ABNORMAL HIGH (ref 12.0–46.0)
Lymphs Abs: 1.4 10*3/uL (ref 0.7–4.0)
MCHC: 33.4 g/dL (ref 30.0–36.0)
MCV: 86.3 fl (ref 78.0–100.0)
Monocytes Absolute: 0.4 10*3/uL (ref 0.1–1.0)
Monocytes Relative: 12.1 % — ABNORMAL HIGH (ref 3.0–12.0)
Neutro Abs: 1.1 10*3/uL — ABNORMAL LOW (ref 1.4–7.7)
Neutrophils Relative %: 37 % — ABNORMAL LOW (ref 43.0–77.0)
Platelets: 145 10*3/uL — ABNORMAL LOW (ref 150.0–400.0)
RBC: 4.9 Mil/uL (ref 4.22–5.81)
RDW: 14.8 % (ref 11.5–15.5)
WBC: 3.1 10*3/uL — ABNORMAL LOW (ref 4.0–10.5)

## 2019-11-17 LAB — CK: Total CK: 874 U/L — ABNORMAL HIGH (ref 7–232)

## 2019-11-17 LAB — VITAMIN D 25 HYDROXY (VIT D DEFICIENCY, FRACTURES): VITD: 23.74 ng/mL — ABNORMAL LOW (ref 30.00–100.00)

## 2019-11-17 LAB — VITAMIN B12: Vitamin B-12: 268 pg/mL (ref 211–911)

## 2019-11-18 NOTE — Progress Notes (Signed)
No encounter for virtual   He was seen in office later this day.

## 2019-11-25 ENCOUNTER — Other Ambulatory Visit: Payer: Self-pay | Admitting: *Deleted

## 2019-11-25 ENCOUNTER — Telehealth: Payer: Self-pay | Admitting: Family Medicine

## 2019-11-25 DIAGNOSIS — I10 Essential (primary) hypertension: Secondary | ICD-10-CM

## 2019-11-25 DIAGNOSIS — K219 Gastro-esophageal reflux disease without esophagitis: Secondary | ICD-10-CM

## 2019-11-25 MED ORDER — AMLODIPINE BESYLATE 5 MG PO TABS: 5.0000 mg | ORAL_TABLET | Freq: Every day | ORAL | 0 refills | Status: DC

## 2019-11-25 MED ORDER — PANTOPRAZOLE SODIUM 40 MG PO TBEC: 40.0000 mg | DELAYED_RELEASE_TABLET | Freq: Every day | ORAL | 1 refills | Status: AC

## 2019-11-25 NOTE — Telephone Encounter (Signed)
Needs refill on BP medication  [id number] - [place], [address] AT [place] Phone:  [phone]  Fax:  [phone]

## 2019-11-25 NOTE — Telephone Encounter (Signed)
Spoke with patient. Rx for Amlodipine and acid reflux medication sent to pharmacy.

## 2020-02-15 ENCOUNTER — Telehealth: Payer: Self-pay | Admitting: Family Medicine

## 2020-02-15 NOTE — Telephone Encounter (Signed)
Pt is requesting to speak to you. Pt says, that when he is exercising he is developing lower abdomen pain. Pt says, it's only when he is exercising. Thanks

## 2020-02-15 NOTE — Telephone Encounter (Signed)
Pt needs visit to discuss

## 2020-02-22 ENCOUNTER — Ambulatory Visit: Payer: 59 | Admitting: Family Medicine

## 2020-03-08 ENCOUNTER — Other Ambulatory Visit: Payer: Self-pay

## 2020-03-08 ENCOUNTER — Other Ambulatory Visit: Payer: Self-pay | Admitting: Family Medicine

## 2020-03-08 DIAGNOSIS — I10 Essential (primary) hypertension: Secondary | ICD-10-CM

## 2020-03-08 NOTE — Telephone Encounter (Signed)
Lvm pt needs appt

## 2020-03-08 NOTE — Telephone Encounter (Signed)
Patient called stating that he needs to talk to someone about these cramps that he keeps having in his lower abdomin. He said that his stomach jumps when he is sitting down. When he sneezes his stomach cramps and locks up to where he can't move. He went to [facility] and cramped up from his abdomin to his feet and they had to get an ambulance to take him to the hospital.   The patient is stating that no one will do anything or do a MRI or give him medication and he hasn't even received his lab results.  Please advise

## 2020-03-09 ENCOUNTER — Telehealth: Payer: Self-pay | Admitting: *Deleted

## 2020-03-09 ENCOUNTER — Ambulatory Visit: Payer: 59 | Admitting: Family Medicine

## 2020-03-09 DIAGNOSIS — R109 Unspecified abdominal pain: Secondary | ICD-10-CM

## 2020-03-09 NOTE — Telephone Encounter (Signed)
Patient called afterhour line and reported he has body complications: abdominal cramping severe, nothing helping, locking up, nerves jumping. Sneezing it went again, tried stretching, going for months, and wondering what to do. Nothing done to check it by this MD. Martin Majestic to ER a month ago from restaurant. He is asking for a referral for someone to help him as well. Also having leg cramps. Denies diarrhea. does self cath. Wants referral to be seen . Has tried several medications.   Patient was scheduled to see Dr. Sarajane Jews 6/17 at 9:45 Am but canceled. Also had a an appt with PCP on 6/1 but was a no show

## 2020-03-10 ENCOUNTER — Encounter (HOSPITAL_COMMUNITY): Payer: Self-pay | Admitting: *Deleted

## 2020-03-10 ENCOUNTER — Other Ambulatory Visit: Payer: Self-pay

## 2020-03-10 ENCOUNTER — Telehealth: Payer: Self-pay | Admitting: Family Medicine

## 2020-03-10 ENCOUNTER — Emergency Department (HOSPITAL_COMMUNITY): Payer: 59

## 2020-03-10 ENCOUNTER — Emergency Department (HOSPITAL_COMMUNITY)
Admission: EM | Admit: 2020-03-10 | Discharge: 2020-03-10 | Disposition: A | Discharge status: Home / Self Care | Payer: 59 | Attending: Emergency Medicine | Admitting: Emergency Medicine

## 2020-03-10 DIAGNOSIS — R109 Unspecified abdominal pain: Secondary | ICD-10-CM | POA: Insufficient documentation

## 2020-03-10 DIAGNOSIS — Z5321 Procedure and treatment not carried out due to patient leaving prior to being seen by health care provider: Secondary | ICD-10-CM | POA: Diagnosis not present

## 2020-03-10 DIAGNOSIS — R0602 Shortness of breath: Secondary | ICD-10-CM | POA: Diagnosis not present

## 2020-03-10 DIAGNOSIS — R002 Palpitations: Secondary | ICD-10-CM | POA: Insufficient documentation

## 2020-03-10 DIAGNOSIS — R079 Chest pain, unspecified: Secondary | ICD-10-CM | POA: Insufficient documentation

## 2020-03-10 LAB — BASIC METABOLIC PANEL
Anion gap: 10 (ref 5–15)
BUN: 14 mg/dL (ref 8–23)
CO2: 20 mmol/L — ABNORMAL LOW (ref 22–32)
Calcium: 9.1 mg/dL (ref 8.9–10.3)
Chloride: 103 mmol/L (ref 98–111)
Creatinine, Ser: 1.57 mg/dL — ABNORMAL HIGH (ref 0.61–1.24)
GFR calc Af Amer: 54 mL/min — ABNORMAL LOW (ref >60)
GFR calc non Af Amer: 47 mL/min — ABNORMAL LOW (ref >60)
Glucose, Bld: 82 mg/dL (ref 70–99)
Potassium: 4.5 mmol/L (ref 3.5–5.1)
Sodium: 133 mmol/L — ABNORMAL LOW (ref 135–145)

## 2020-03-10 LAB — CBC
HCT: 44.9 % (ref 39.0–52.0)
Hemoglobin: 15 g/dL (ref 13.0–17.0)
MCH: 28.7 pg (ref 26.0–34.0)
MCHC: 33.4 g/dL (ref 30.0–36.0)
MCV: 86 fL (ref 80.0–100.0)
Platelets: 150 10*3/uL (ref 150–400)
RBC: 5.22 MIL/uL (ref 4.22–5.81)
RDW: 13.8 % (ref 11.5–15.5)
WBC: 3.5 10*3/uL — ABNORMAL LOW (ref 4.0–10.5)
nRBC: 0 % (ref 0.0–0.2)

## 2020-03-10 LAB — TROPONIN I (HIGH SENSITIVITY): Troponin I (High Sensitivity): 3 ng/L (ref <18)

## 2020-03-10 MED ORDER — SODIUM CHLORIDE 0.9% FLUSH: 3.0000 mL | Freq: Once | INTRAVENOUS | Status: DC

## 2020-03-10 NOTE — ED Triage Notes (Signed)
Pt reports onset this am of chest discomfort and "fluttering" sensation. Has abd pain. Denies any vomiting or diarrhea. Had sob earlier this am.

## 2020-03-10 NOTE — ED Notes (Signed)
Pt called with no response 

## 2020-03-10 NOTE — Telephone Encounter (Signed)
Previous note has been sent to [place]

## 2020-03-10 NOTE — Telephone Encounter (Signed)
Please advise about referral 

## 2020-03-10 NOTE — Telephone Encounter (Signed)
Patient Is still having cramps like previous cramps he saw Dr [name] for.  He states this is still an ongoing problem and wants Dr [name] to let him know what he can do and to get a referral.  He wants a call about this referral.  He refused to make an appointment.

## 2020-03-13 ENCOUNTER — Encounter: Payer: Self-pay | Admitting: Internal Medicine

## 2020-03-13 NOTE — Telephone Encounter (Signed)
Agree that he needs further evaluation for persistent symptoms.  One recent note indicated some chest pain.  Confirm if having any recent chest pain.  If so, recommend cardiology referral.  If not, set up GI referral for severe recurrent abdominal cramp like pains

## 2020-03-13 NOTE — Telephone Encounter (Signed)
Pt states no chest pain and referral has been placed to GI

## 2020-03-13 NOTE — Addendum Note (Signed)
Addended by: Modena Morrow R on: 03/13/2020 01:37 PM   Modules accepted: Orders

## 2020-03-23 ENCOUNTER — Encounter (HOSPITAL_COMMUNITY): Payer: Self-pay

## 2020-03-23 ENCOUNTER — Other Ambulatory Visit: Payer: Self-pay

## 2020-03-23 ENCOUNTER — Emergency Department (HOSPITAL_COMMUNITY)
Admission: EM | Admit: 2020-03-23 | Discharge: 2020-03-23 | Disposition: A | Discharge status: Home / Self Care | Payer: 59 | Attending: Emergency Medicine | Admitting: Emergency Medicine

## 2020-03-23 DIAGNOSIS — R252 Cramp and spasm: Secondary | ICD-10-CM | POA: Insufficient documentation

## 2020-03-23 DIAGNOSIS — I10 Essential (primary) hypertension: Secondary | ICD-10-CM | POA: Diagnosis not present

## 2020-03-23 DIAGNOSIS — Z79899 Other long term (current) drug therapy: Secondary | ICD-10-CM | POA: Diagnosis not present

## 2020-03-23 DIAGNOSIS — E860 Dehydration: Secondary | ICD-10-CM | POA: Diagnosis not present

## 2020-03-23 LAB — CBC
HCT: 44.5 % (ref 39.0–52.0)
Hemoglobin: 14.9 g/dL (ref 13.0–17.0)
MCH: 28.7 pg (ref 26.0–34.0)
MCHC: 33.5 g/dL (ref 30.0–36.0)
MCV: 85.7 fL (ref 80.0–100.0)
Platelets: 160 10*3/uL (ref 150–400)
RBC: 5.19 MIL/uL (ref 4.22–5.81)
RDW: 14 % (ref 11.5–15.5)
WBC: 3.1 10*3/uL — ABNORMAL LOW (ref 4.0–10.5)
nRBC: 0 % (ref 0.0–0.2)

## 2020-03-23 LAB — LIPASE, BLOOD: Lipase: 31 U/L (ref 11–51)

## 2020-03-23 LAB — COMPREHENSIVE METABOLIC PANEL
ALT: 47 U/L — ABNORMAL HIGH (ref 0–44)
AST: 40 U/L (ref 15–41)
Albumin: 4.6 g/dL (ref 3.5–5.0)
Alkaline Phosphatase: 36 U/L — ABNORMAL LOW (ref 38–126)
Anion gap: 10 (ref 5–15)
BUN: 20 mg/dL (ref 8–23)
CO2: 23 mmol/L (ref 22–32)
Calcium: 8.9 mg/dL (ref 8.9–10.3)
Chloride: 103 mmol/L (ref 98–111)
Creatinine, Ser: 1.33 mg/dL — ABNORMAL HIGH (ref 0.61–1.24)
GFR calc Af Amer: >60 mL/min (ref >60)
GFR calc non Af Amer: 57 mL/min — ABNORMAL LOW (ref >60)
Glucose, Bld: 83 mg/dL (ref 70–99)
Potassium: 4.1 mmol/L (ref 3.5–5.1)
Sodium: 136 mmol/L (ref 135–145)
Total Bilirubin: 0.6 mg/dL (ref 0.3–1.2)
Total Protein: 8.6 g/dL — ABNORMAL HIGH (ref 6.5–8.1)

## 2020-03-23 LAB — URINALYSIS, ROUTINE W REFLEX MICROSCOPIC
Bacteria, UA: NONE SEEN
Bilirubin Urine: NEGATIVE
Glucose, UA: NEGATIVE mg/dL
Hgb urine dipstick: NEGATIVE
Ketones, ur: NEGATIVE mg/dL
Nitrite: NEGATIVE
Protein, ur: NEGATIVE mg/dL
Specific Gravity, Urine: 1.006 (ref 1.005–1.030)
pH: 6 (ref 5.0–8.0)

## 2020-03-23 LAB — CK: Total CK: 513 U/L — ABNORMAL HIGH (ref 49–397)

## 2020-03-23 LAB — MAGNESIUM: Magnesium: 2.2 mg/dL (ref 1.7–2.4)

## 2020-03-23 MED ORDER — SODIUM CHLORIDE 0.9 % IV BOLUS
1000.0000 mL | Freq: Once | INTRAVENOUS | Status: AC
  Administered 2020-03-23: 1000 mL via INTRAVENOUS

## 2020-03-23 MED ORDER — SODIUM CHLORIDE 0.9% FLUSH: 3.0000 mL | Freq: Once | INTRAVENOUS | Status: DC

## 2020-03-23 NOTE — Discharge Instructions (Addendum)
At this time there does not appear to be the presence of an emergent medical condition, however there is always the potential for conditions to change. Please read and follow the below instructions.  Please return to the Emergency Department immediately for any new or worsening symptoms. Call your primary care doctor tomorrow morning to schedule a follow-up appointment for recheck of your blood work and reassessment. Please be sure to drink plenty water and get plenty of rest.  Avoid strenuous outdoor activities during this heat wave. Your urine is currently being cultured if it grows out bacteria that require antibiotic treatment you will be contacted by [facility] health.  Get help right away if you have: Any symptoms of very bad dehydration. Symptoms of vomiting, such as: You cannot eat or drink without vomiting. Your vomiting gets worse or does not go away. Your vomit has blood or green stuff in it. Symptoms that get worse with treatment. A fever. A very bad headache. Problems with peeing or pooping (having a bowel movement), such as: Watery poop that gets worse or does not go away. Blood in your poop (stool). This may cause poop to look black and tarry. Not peeing in 6-8 hours. Peeing only a small amount of very dark pee in 6-8 hours. Trouble breathing. Your cramps or spasms get more severe or happen more often. Your cramps or spasms do not improve over time. You have any new/concerning or worsening of symptoms These symptoms may be an emergency. Do not wait to see if the symptoms will go away. Get medical help right away. Call your local emergency services (911 in the [place] Do not drive yourself to the hospital.  Please read the additional information packets attached to your discharge summary.  Do not take your medicine if  develop an itchy rash, swelling in your mouth or lips, or difficulty breathing; call 911 and seek immediate emergency medical attention if this occurs.  You may  review your lab tests and imaging results in their entirety on your MyChart account.  Please discuss all results of fully with your primary care provider and other specialist at your follow-up visit.  Note: Portions of this text may have been transcribed using voice recognition software. Every effort was made to ensure accuracy; however, inadvertent computerized transcription errors may still be present.

## 2020-03-23 NOTE — ED Triage Notes (Signed)
Patient c/o abdominal and leg cramping x 2-3 days. Patient states he has been seen previously for the same.  Patient also c/o hypertension BP in triage-143/99.

## 2020-03-23 NOTE — ED Provider Notes (Signed)
[facility] DEPT Provider Note   CSN: [id number] Arrival date & time: 03/23/20  1737     History Chief Complaint  Patient presents with   Abdominal Cramping   leg cramping   Hypertension    [name] is a 63 y.o. male history of GERD, gout, adenomatous polyp of colon, hypertension, MI/orchitis, chronic back pain.  Patient presents today for concern of intermittent abdominal and leg cramping x2-3 days.  He reports history of this problem in the past that he was worked up by his PCP and no etiology was found.  Patient concerned he may be dehydrated.  He describes mild-moderate cramping of his abdomen whenever he attempts to do a sit up this improves whenever he is resting or standing up straight, pain does not radiate and only occurs with muscle activation.  He reports similar cramping to his bilateral thighs only with certain squats but this is not always present.  Of note patient reports that he does occasionally drink alcohol 1-2 times per week, has never experienced alcohol withdrawals.  Has not had an alcoholic beverage in the past 5 days.  Additionally patient denies use of psychiatric or serotonin medications.  He denies fevers/chills, headache, neck pain, sore throat, chest pain/shortness of breath, nausea/vomiting, diarrhea, dysuria/hematuria, extremity swelling/color change, numbness/tingling, weakness, fall/injury or any additional concerns. HPI     Past Medical History:  Diagnosis Date   Epididymitis    GERD (gastroesophageal reflux disease)    Gout of big toe 05/21/2013   Hard of hearing    History of gout    Hx of adenomatous polyp of colon 02/01/2017   Hypertension    Orchitis    Sepsis ([place]) hospitalized 12/25/2015    Patient Active Problem List   Diagnosis Date Noted   History of chronic back pain 01/27/2019   Urethral stricture in male 01/27/2019   Hx of adenomatous polyp of colon 02/01/2017   Headache     Elevated bilirubin    Gout of big toe 05/21/2013   Hypertension 11/03/2012    Past Surgical History:  Procedure Laterality Date   CYSTOSCOPY WITH DIRECT VISION INTERNAL URETHROTOMY N/A 01/25/2016   Procedure:  DIRECT VISION INTERNAL URETHROTOMY;  Surgeon: Ardis Hughs, MD;  Location: [facility];  Service: Urology;  Laterality: N/A;   CYSTOSCOPY WITH RETROGRADE URETHROGRAM N/A 01/25/2016   Procedure:  RETROGRADE URETHROGRAM;  Surgeon: Ardis Hughs, MD;  Location: [facility];  Service: Urology;  Laterality: N/A;   INGUINAL HERNIA REPAIR Right    WISDOM TOOTH EXTRACTION         Family History  Problem Relation Age of Onset   Diabetes Mellitus II Paternal Grandfather    Throat cancer Sister    Colon cancer Neg Hx    Esophageal cancer Neg Hx    Pancreatic cancer Neg Hx    Prostate cancer Neg Hx    Rectal cancer Neg Hx    Stomach cancer Neg Hx     Social History   Tobacco Use   Smoking status: Never Smoker   Smokeless tobacco: Never Used  Vaping Use   Vaping Use: Never used  Substance Use Topics   Alcohol use: Yes    Alcohol/week: 6.0 standard drinks    Types: 6 Glasses of wine per week   Drug use: No    Home Medications Prior to Admission medications   Medication Sig Start Date End Date Taking? Authorizing Provider  amLODipine (NORVASC) 5 MG tablet TAKE 1 TABLET(5  MG) BY MOUTH DAILY 03/08/20   Burchette, Alinda Sierras, MD  colchicine (COLCRYS) 0.6 MG tablet Take 1 tablet (0.6 mg total) by mouth 2 (two) times daily. 09/01/19   Burchette, Alinda Sierras, MD  diclofenac sodium (VOLTAREN) 1 % GEL Apply 2 g topically 4 (four) times daily. 10/08/17   Burchette, Alinda Sierras, MD  diclofenac Sodium (VOLTAREN) 1 % GEL Apply 4 g topically 4 (four) times daily. 11/16/19   Burchette, Alinda Sierras, MD  losartan (COZAAR) 100 MG tablet Take 1 tablet (100 mg total) by mouth daily. 08/11/19   Burchette, Alinda Sierras, MD  Multiple Vitamins-Minerals (MENS 50+ MULTI VITAMIN/MIN PO) Take 1 tablet by  mouth daily.    [provider]  nitrofurantoin, macrocrystal-monohydrate, (MACROBID) 100 MG capsule Take 1 capsule (100 mg total) by mouth 2 (two) times daily. 09/07/19   Burchette, Alinda Sierras, MD  pantoprazole (PROTONIX) 40 MG tablet Take 1 tablet (40 mg total) by mouth daily. 11/25/19   Burchette, Alinda Sierras, MD    Allergies    Lisinopril  Review of Systems   Review of Systems Ten systems are reviewed and are negative for acute change except as noted in the HPI  Physical Exam Updated Vital Signs BP (!) 143/99 (BP Location: Right Arm)    Pulse 72    Temp 97.8 F (36.6 C) (Oral)    Resp 18    Ht 5\' 9"  (1.753 m)    Wt 97.5 kg    SpO2 97%    BMI 31.75 kg/m   Physical Exam Constitutional:      General: He is not in acute distress.    Appearance: Normal appearance. He is well-developed. He is not ill-appearing or diaphoretic.  HENT:     Head: Normocephalic and atraumatic.     Right Ear: External ear normal.     Left Ear: External ear normal.  Eyes:     General: Vision grossly intact. Gaze aligned appropriately.     Pupils: Pupils are equal, round, and reactive to light.  Neck:     Trachea: Trachea and phonation normal.  Cardiovascular:     Rate and Rhythm: Normal rate and regular rhythm.     Pulses: Normal pulses.  Pulmonary:     Effort: Pulmonary effort is normal. No respiratory distress.  Abdominal:     General: There is no distension.     Palpations: Abdomen is soft.     Tenderness: There is no abdominal tenderness. There is no guarding or rebound.  Musculoskeletal:        General: Normal range of motion.     Cervical back: Normal range of motion.     Right lower leg: No edema.     Left lower leg: No edema.     Comments: No midline C/T/L spinal tenderness to palpation, no paraspinal muscle tenderness, no deformity, crepitus, or step-off noted. No sign of injury to the neck or back.  Skin:    General: Skin is warm and dry.  Neurological:     Mental Status: He is alert.      GCS: GCS eye subscore is 4. GCS verbal subscore is 5. GCS motor subscore is 6.     Comments: Speech is clear and goal oriented, follows commands Major Cranial nerves without deficit, no facial droop Normal strength in upper and lower extremities bilaterally including dorsiflexion and plantar flexion, strong and equal grip strength Sensation normal to light and sharp touch Moves extremities without ataxia, coordination intact Normal finger to nose  and rapid alternating movements Neg romberg, no pronator drift Normal gait Normal heel-shin and balance DTR 2+ bilateral patella, no clonus in the feet  Psychiatric:        Behavior: Behavior normal.     ED Results / Procedures / Treatments   Labs (all labs ordered are listed, but only abnormal results are displayed) Labs Reviewed  COMPREHENSIVE METABOLIC PANEL - Abnormal; Notable for the following components:      Result Value   Creatinine, Ser 1.33 (*)    Total Protein 8.6 (*)    ALT 47 (*)    Alkaline Phosphatase 36 (*)    GFR calc non Af Amer 57 (*)    All other components within normal limits  CBC - Abnormal; Notable for the following components:   WBC 3.1 (*)    All other components within normal limits  URINALYSIS, ROUTINE W REFLEX MICROSCOPIC - Abnormal; Notable for the following components:   Color, Urine STRAW (*)    Leukocytes,Ua TRACE (*)    All other components within normal limits  CK - Abnormal; Notable for the following components:   Total CK 513 (*)    All other components within normal limits  MAGNESIUM  LIPASE, BLOOD    EKG None  Radiology No results found.  Procedures Procedures (including critical care time)  Medications Ordered in ED Medications  sodium chloride flush (NS) 0.9 % injection 3 mL (has no administration in time range)  sodium chloride 0.9 % bolus 1,000 mL (1,000 mLs Intravenous New Bag/Given 03/23/20 2050)    ED Course  I have reviewed the triage vital signs and the nursing  notes.  Pertinent labs & imaging results that were available during my care of the patient were reviewed by me and considered in my medical decision making (see chart for details).    MDM Rules/Calculators/A&P                         Additional History Obtained: 1. Nursing notes from this visit. 2. EMR reviewed, patient had a visit at his primary care doctor's office on November 16, 2019.  Was diagnosed with myalgias, Achilles tendinitis, muscle cramps.  Lab work showed mild leukopenia, no anemia.  CK elevated at 874.  CMP showed mild elevation of LFTs without evidence of acute kidney injury.  Per assessment it appears intermittent severe muscle cramps came in paroxysms, only regular medications amlodipine and losartan.  Their plan was icing for her Achilles tendinitis, diclofenac.  Recheck of labs and possible future electromyography.  If unrevealing plan was to possibly start baclofen. Further review of EMR showed patient had a telephone encounter with his primary care doctor's office for these muscle cramps on June 18 but did not make an appointment.  He checked into the emergency department that same day, had a negative chest x-ray, negative troponin.  CBC without emergent findings.  BMP showed creatinine of 1.57 no emergent findings.  EKG was without acute ischemic changes. ------------- Patient reports the cramps earlier this month had resolved but began again over the last 2-3 days.  He describes intermittent cramping of the muscles of his abdominal wall, there is no evidence of trauma or overlying skin changes.  He additionally reports cramping of his bilateral thighs again no evidence of injury or overlying skin changes.  These symptoms appear to only because whenever he is doing sit up or use of the muscles but this is not consistently reproducible.  On exam  he is well-appearing no acute distress with reassuring vital signs.  No fever, tachycardia, hypotension, tachypnea or hypoxia on room air.   He denies any recent illness or symptoms suggestive of gastroenteritis.  He denies any chest pain or abdominal pain reports he is otherwise well. - I ordered, reviewed and interpreted labs which include: Lipase within normal limits, no evidence of pancreatitis. Magnesium within normal limits is reassuring. CK of 513 suspicious for dehydration/muscle use. CMP shows creatinine of 1.33 which appears baseline, no emergent electrolyte derangement, evidence of acute kidney injury or emergent elevation of LFTs.  Minimal elevation of ALT today at 47.  No gap. CBC shows leukopenia of 3.1, no evidence of infection as this appears baseline.  No evidence of anemia. Urinalysis shows trace leukocytes otherwise within normal limits, doubt infection, patient has no urinary symptoms.  Urine culture sent. - Patient reevaluated he is resting comfortably in bed no acute distress reports he is feeling well and is agreeable for discharge.  On reassessment patient's abdomen remains nontender, no evidence for SBO, appendicitis, diverticulitis, cholecystitis or other emergent intra-abdominal pathologies of his cramping.  Suspect patient muscle wall cramping may be secondary to dehydration, he was given a 1 L fluid bolus in the emergency department.  I have encouraged him to call his primary care doctor to schedule a follow-up appointment for reevaluation early next week and return to the emergency department for any new or worsening symptoms.  Encouraged continued fluid rehydration and rest and to avoid strenuous activity outdoors.  Additionally patient counseled on alcohol use, he does not appear to be intoxicated or in withdrawal today.  Additionally patient does not take any antipsychotic medications or serotonin medications.  He has a normal neurologic exam and no evidence of clonus.  At this time there does not appear to be any evidence of an acute emergency medical condition and the patient appears stable for discharge  with appropriate outpatient follow up. Diagnosis was discussed with patient who verbalizes understanding of care plan and is agreeable to discharge. I have discussed return precautions with patient who verbalizes understanding. Patient encouraged to follow-up with their PCP. All questions answered.   Note: Portions of this report may have been transcribed using voice recognition software. Every effort was made to ensure accuracy; however, inadvertent computerized transcription errors may still be present. Final Clinical Impression(s) / ED Diagnoses Final diagnoses:  Muscle cramp  Dehydration    Rx / DC Orders ED Discharge Orders    None       Gari Crown 03/23/20 2210    Quintella Reichert, MD 03/23/20 2356

## 2020-03-26 LAB — URINE CULTURE: Culture: >=100000 — AB

## 2020-03-27 ENCOUNTER — Telehealth: Payer: Self-pay | Admitting: Emergency Medicine

## 2020-03-27 NOTE — Telephone Encounter (Signed)
Post ED Visit - Positive Culture Follow-up  Culture report reviewed by antimicrobial stewardship pharmacist: [facility] Team []  Elenor Quinones, Pharm.D. []  Heide Guile, Pharm.D., BCPS AQ-ID []  [place] Neptune, Pharm.D., BCPS []  Alycia Rossetti, Pharm.D., BCPS []  [place], [place].D., BCPS, AAHIVP []  Legrand Como, Pharm.D., BCPS, AAHIVP []  Salome Arnt, PharmD, BCPS []  Johnnette Gourd, PharmD, BCPS []  Hughes Better, PharmD, BCPS []  Leeroy Cha, PharmD []  Laqueta Linden, PharmD, BCPS []  Albertina Parr, PharmD  [facility] Team []  Leodis Sias, PharmD []  Lindell Spar, PharmD []  Royetta Asal, PharmD []  Graylin Shiver, Rph []  Rema Fendt) Glennon Mac, PharmD []  Arlyn Dunning, PharmD []  Netta Cedars, PharmD []  Dia Sitter, PharmD []  Leone Haven, PharmD []  Gretta Arab, PharmD []  Theodis Shove, PharmD []  Peggyann Juba, PharmD []  Reuel Boom, PharmD Ulice Dash PharmD   Positive urine culture Treated with none, asymptomatic, no further patient follow-up is required at this time.  Hazle Nordmann 03/27/2020, 12:34 PM

## 2020-04-13 ENCOUNTER — Ambulatory Visit (INDEPENDENT_AMBULATORY_CARE_PROVIDER_SITE_OTHER): Payer: 59 | Admitting: Internal Medicine

## 2020-04-13 ENCOUNTER — Encounter: Payer: Self-pay | Admitting: Internal Medicine

## 2020-04-13 ENCOUNTER — Other Ambulatory Visit (INDEPENDENT_AMBULATORY_CARE_PROVIDER_SITE_OTHER): Payer: 59

## 2020-04-13 VITALS — BP 119/74 | HR 84 | Ht 69.0 in | Wt 231.0 lb

## 2020-04-13 DIAGNOSIS — Z86010 Personal history of colonic polyps: Secondary | ICD-10-CM | POA: Diagnosis not present

## 2020-04-13 DIAGNOSIS — R748 Abnormal levels of other serum enzymes: Secondary | ICD-10-CM

## 2020-04-13 DIAGNOSIS — M791 Myalgia, unspecified site: Secondary | ICD-10-CM

## 2020-04-13 LAB — ALT: ALT: 43 U/L (ref 0–53)

## 2020-04-13 LAB — AST: AST: 34 U/L (ref 0–37)

## 2020-04-13 NOTE — Progress Notes (Signed)
[name] 64 y.o. [birth year] [id number]  Assessment & Plan:   Encounter Diagnoses  Name Primary?  . Myalgia Yes  . Elevated CK   . Hx of adenomatous polyp of colon     There are something odd about this, his CK has been elevated 3 times and certainly vigorous exercise etc. can do that but it makes me wonder about some type of myositis.  I did not see a rash or Gottron's papules or anything like that but I am going to repeat CK transaminases LDH and do an aldolase.  [name] had mentioned he might need an EMG when he saw him in February.  He is probably going to need something like that and maybe even a muscle biopsy depending upon what we come up with.  I am having a hard time at this point knowing what I know now with the episodes, thinking that this is intermittent dehydration and elevation of CK. Note that the mild LDH elevation was during an episode of urosepsis in 2017 so probably not related to what ever this cramping syndrome is.  We will see what those labs show and figure out where to go from there.  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  I will discuss that with him after these labs are back.  I appreciate the opportunity to care for this patient. CC: [name]    Subjective:   Chief Complaint: Abdominal cramps  HPI Patient is a 64 year old African-American man last seen by me in May 2018 for a screening colonoscopy and EGD, complaining of abdominal cramps.  He reports terrible abdominal cramps in the epigastrium and periumbilical areas as well as lower abdominal area.  They can come in anytime they are not related to eating they are related to movement or bending.  He has been working out and doing abdominal exercises trying to use the abdominal machine at the gym but it is really a bad problem.  He also at times suffers from leg cramps.  Bowel  movements are regular no nausea or vomiting.  He has been evaluated in the past and looking through the labs I can see that on 3 occasions at least he has had elevated creatine kinase.  In June 2020 it was 680 in February 2021 it was 874 and on July 1 with a recent ER visit which he was told he was dehydrated it was 513.  LDH was up in 2017 and was 220.  He does have colchicine on his medication list but only uses that intermittently and not in some time.  Nonsteroidal topical agents were recommended by primary care.  Nothing really seems to have helped.  I do not think he has skin rash issues.  His transaminases have been elevated before with AST 46 and ALT 55 in February of this year and then an ALT of 47 on July 1.  He does have some mild renal insufficiency/question chronic kidney disease his creatinine was 1.33 his GFR was normal however on July 1.  Other electrolytes are normal.   He has been to urology with Dr. Lovena Thompson recently, he has a urethral stricture and does daily self cath.  It sounds like he has some erectile dysfunction and was prescribed a phosphodiesterase inhibitor though I cannot find that in the medication reconciliation section, he says it is working.  He is also on generic  Flomax.  He has had a couple of urinary tract infections and has been treated and is better.      Allergies  Allergen Reactions  . Lisinopril Cough   Current Meds  Medication Sig  . amLODipine (NORVASC) 5 MG tablet TAKE 1 TABLET(5 MG) BY MOUTH DAILY  . diclofenac sodium (VOLTAREN) 1 % GEL Apply 2 g topically 4 (four) times daily.  . diclofenac Sodium (VOLTAREN) 1 % GEL Apply 4 g topically 4 (four) times daily.  Marland Kitchen losartan (COZAAR) 100 MG tablet Take 1 tablet (100 mg total) by mouth daily.  . Multiple Vitamins-Minerals (MENS 50+ MULTI VITAMIN/MIN PO) Take 1 tablet by mouth daily.  . pantoprazole (PROTONIX) 40 MG tablet Take 1 tablet (40 mg total) by mouth daily.  . tamsulosin (FLOMAX) 0.4 MG CAPS capsule  Take 0.4 mg by mouth daily.  . [DISCONTINUED] nitrofurantoin, macrocrystal-monohydrate, (MACROBID) 100 MG capsule Take 1 capsule (100 mg total) by mouth 2 (two) times daily.  viagra? Past Medical History:  Diagnosis Date  . Epididymitis   . GERD (gastroesophageal reflux disease)   . Gout of big toe 05/21/2013  . Hard of hearing   . History of gout   . Hx of adenomatous polyp of colon 02/01/2017  . Hypertension   . Orchitis   . Sepsis ([place]) hospitalized 12/25/2015  . Urethral stricture    Past Surgical History:  Procedure Laterality Date  . CYSTOSCOPY WITH DIRECT VISION INTERNAL URETHROTOMY N/A 01/25/2016   Procedure:  DIRECT VISION INTERNAL URETHROTOMY;  Surgeon: Ardis Hughs, Thompson;  Location: [facility];  Service: Urology;  Laterality: N/A;  . CYSTOSCOPY WITH RETROGRADE URETHROGRAM N/A 01/25/2016   Procedure:  RETROGRADE URETHROGRAM;  Surgeon: Ardis Hughs, Thompson;  Location: [facility];  Service: Urology;  Laterality: N/A;  . INGUINAL HERNIA REPAIR Right   . WISDOM TOOTH EXTRACTION     Social History   Social History Narrative   Marital status is separated   Employed as a Education officer, community   One son to daughters born in the 1980s, he has one grandchild and 1 great grandchild   12/25/2016   family history includes Diabetes Mellitus II in his paternal grandfather; Throat cancer in his sister.   Review of Systems As per HPI, HOH - hearing aid use Otherwise negative  Objective:   Physical Exam BP 119/74   Pulse 84   Ht 5\' 9"  (1.753 m)   Wt (!) 231 lb (104.8 kg)   SpO2 100%   BMI 34.11 kg/m  Obese bm NAD Eyes anicteric HOH abd is soft and non-tender - small diastasis recti Carnett's sign is negative Able to do an abdominal crunch ok Alert and oriented x 3  Data reviewed see HPI - PCP, ED notes labs, imaging viewed approp mood/affect

## 2020-04-13 NOTE — Patient Instructions (Signed)
  Your provider has requested that you go to the basement level for lab work before leaving today. Press "B" on the elevator. The lab is located at the first door on the left as you exit the elevator.    I appreciate the opportunity to care for you. Carl Gessner, MD, FACG 

## 2020-04-14 LAB — LACTATE DEHYDROGENASE: LDH: 180 U/L (ref 120–250)

## 2020-04-14 LAB — CK TOTAL AND CKMB (NOT AT ARMC)
CK, MB: 2.8 ng/mL (ref 0–5.0)
Relative Index: 0.6 (ref 0–4.0)
Total CK: 464 U/L — ABNORMAL HIGH (ref 44–196)

## 2020-04-14 LAB — ALDOLASE: Aldolase: 7.6 U/L (ref <=8.1)

## 2020-04-24 ENCOUNTER — Other Ambulatory Visit: Payer: Self-pay

## 2020-04-24 DIAGNOSIS — R748 Abnormal levels of other serum enzymes: Secondary | ICD-10-CM

## 2020-05-02 ENCOUNTER — Encounter: Payer: Self-pay | Admitting: Neurology

## 2020-05-11 ENCOUNTER — Other Ambulatory Visit (INDEPENDENT_AMBULATORY_CARE_PROVIDER_SITE_OTHER): Payer: 59

## 2020-05-11 ENCOUNTER — Ambulatory Visit (INDEPENDENT_AMBULATORY_CARE_PROVIDER_SITE_OTHER): Payer: 59 | Admitting: Neurology

## 2020-05-11 ENCOUNTER — Other Ambulatory Visit: Payer: Self-pay

## 2020-05-11 ENCOUNTER — Encounter: Payer: Self-pay | Admitting: Neurology

## 2020-05-11 VITALS — BP 119/82 | HR 73 | Resp 18 | Ht 69.0 in | Wt 223.0 lb

## 2020-05-11 DIAGNOSIS — R252 Cramp and spasm: Secondary | ICD-10-CM | POA: Diagnosis not present

## 2020-05-11 DIAGNOSIS — R748 Abnormal levels of other serum enzymes: Secondary | ICD-10-CM

## 2020-05-11 LAB — CK: Total CK: 503 U/L — ABNORMAL HIGH (ref 7–232)

## 2020-05-11 LAB — SEDIMENTATION RATE: Sed Rate: 23 mm/hr — ABNORMAL HIGH (ref 0–20)

## 2020-05-11 LAB — C-REACTIVE PROTEIN: CRP: <1 mg/dL (ref 0.5–20.0)

## 2020-05-11 NOTE — Patient Instructions (Addendum)
Check labs  Nerve and muscle testing of the left arm and leg  Avoid alcohol  Stay well-hydrated   ELECTROMYOGRAM AND NERVE CONDUCTION STUDIES (EMG/NCS) INSTRUCTIONS  How to Prepare The neurologist conducting the EMG will need to know if you have certain medical conditions. Tell the neurologist and other EMG lab personnel if you:  Have a pacemaker or any other electrical medical device  Take blood-thinning medications  Have hemophilia, a blood-clotting disorder that causes prolonged bleeding Bathing Take a shower or bath shortly before your exam in order to remove oils from your skin. Dont apply lotions or creams before the exam.  What to Expect Youll likely be asked to change into a hospital gown for the procedure and lie down on an examination table. The following explanations can help you understand what will happen during the exam.   Electrodes. The neurologist or a technician places surface electrodes at various locations on your skin depending on where youre experiencing symptoms. Or the neurologist may insert needle electrodes at different sites depending on your symptoms.   Sensations. The electrodes will at times transmit a tiny electrical current that you may feel as a twinge or spasm. The needle electrode may cause discomfort or pain that usually ends shortly after the needle is removed. If you are concerned about discomfort or pain, you may want to talk to the neurologist about taking a short break during the exam.   Instructions. During the needle EMG, the neurologist will assess whether there is any spontaneous electrical activity when the muscle is at rest - activity that isnt present in healthy muscle tissue - and the degree of activity when you slightly contract the muscle.  He or she will give you instructions on resting and contracting a muscle at appropriate times. Depending on what muscles and nerves the neurologist is examining, he or she may ask you to change  positions during the exam.  After your EMG You may experience some temporary, minor bruising where the needle electrode was inserted into your muscle. This bruising should fade within several days. If it persists, contact your primary care doctor.     Your provider has requested that you have labwork completed today. Please go to [facility] ([id number]) on the second floor of this building before leaving the office today. You do not need to check in. If you are not called within 15 minutes please check with the front desk.

## 2020-05-11 NOTE — Progress Notes (Signed)
[facility] Clinic Note - Initial Visit   Date: 05/11/20  TRAVIOUS VANOVER MRN: [id number] DOB: 1956/02/03   Dear Dr. Harrold Donath:  Thank you for your kind referral of AIREN STIEHL for consultation of muscle cramps. Although his history is well known to you, please allow [place] to reiterate it for the purpose of our medical record. The patient was accompanied to the clinic by self.   History of Present Illness: DEJION GRILLO is a 64 y.o. right-handed male with hypertension, GERD, and gout presenting for evaluation of muscle cramps.   For the past year, he has been having severe painful muscle cramps in the abdomen and thighs.  Cramps are triggered by exercise, such as abdominal crunches, or bending.  He has done a try to stretch or even lay down on the floor to get relief she often takes some time.  At baseline, he is active and goes to the gym twice per week, but often stops exercising because of the severity of his muscle cramps.  Upon further questioning, he was reports having muscle cramps when in childhood and his siblings also have them.  He was seen in the emergency department in July 2021 because of severe pain associated with his cramps. He reports having dark colored urine and generalized fatigue, no f He stays hydrated with smart water, electrolyte water, and pedialyte. No muscle pain. No history of statin use.  He drinks 4-5 liquor on Friday and Sunday.     Out-side paper records, electronic medical record, and images have been reviewed where available and summarized as:  Component     Latest Ref Rng & Units 03/16/2019 11/17/2019 03/23/2020  CK Total     49 .0 - 397.0 U/L 680 (H) 874 (H) 513 (H)  Labs 04/13/2020:  Aldolase 7.6   No results found for: HGBA1C Lab Results  Component Value Date   VITAMINB12 268 11/17/2019   Lab Results  Component Value Date   TSH 1.63 04/14/2019   Lab Results  Component Value Date   ESRSEDRATE 65 (H) 12/30/2015     Past Medical History:  Diagnosis Date  . Epididymitis   . GERD (gastroesophageal reflux disease)   . Gout of big toe 05/21/2013  . Hard of hearing   . History of gout   . Hx of adenomatous polyp of colon 02/01/2017  . Hypertension   . Orchitis   . Sepsis ([place]) hospitalized 12/25/2015  . Urethral stricture     Past Surgical History:  Procedure Laterality Date  . CYSTOSCOPY WITH DIRECT VISION INTERNAL URETHROTOMY N/A 01/25/2016   Procedure:  DIRECT VISION INTERNAL URETHROTOMY;  Surgeon: Ardis Hughs, MD;  Location: [facility];  Service: Urology;  Laterality: N/A;  . CYSTOSCOPY WITH RETROGRADE URETHROGRAM N/A 01/25/2016   Procedure:  RETROGRADE URETHROGRAM;  Surgeon: Ardis Hughs, MD;  Location: [facility];  Service: Urology;  Laterality: N/A;  . INGUINAL HERNIA REPAIR Right   . WISDOM TOOTH EXTRACTION       Medications:  Outpatient Encounter Medications as of 05/11/2020  Medication Sig  . amLODipine (NORVASC) 5 MG tablet TAKE 1 TABLET(5 MG) BY MOUTH DAILY  . colchicine (COLCRYS) 0.6 MG tablet Take 1 tablet (0.6 mg total) by mouth 2 (two) times daily.  . diclofenac sodium (VOLTAREN) 1 % GEL Apply 2 g topically 4 (four) times daily.  . diclofenac Sodium (VOLTAREN) 1 % GEL Apply 4 g topically 4 (four) times daily.  Marland Kitchen losartan (COZAAR) 100 MG tablet Take  1 tablet (100 mg total) by mouth daily.  . Multiple Vitamins-Minerals (MENS 50+ MULTI VITAMIN/MIN PO) Take 1 tablet by mouth daily.  . pantoprazole (PROTONIX) 40 MG tablet Take 1 tablet (40 mg total) by mouth daily.  . tamsulosin (FLOMAX) 0.4 MG CAPS capsule Take 0.4 mg by mouth daily.   No facility-administered encounter medications on file as of 05/11/2020.    Allergies:  Allergies  Allergen Reactions  . Lisinopril Cough    Family History: Family History  Problem Relation Age of Onset  . Diabetes Mellitus II Paternal Grandfather   . Throat cancer Sister   . Colon cancer Neg Hx   . Esophageal cancer Neg Hx   .  Pancreatic cancer Neg Hx   . Prostate cancer Neg Hx   . Rectal cancer Neg Hx   . Stomach cancer Neg Hx     Social History: Social History   Tobacco Use  . Smoking status: Never Smoker  . Smokeless tobacco: Never Used  Vaping Use  . Vaping Use: Never used  Substance Use Topics  . Alcohol use: Yes    Alcohol/week: 6.0 standard drinks    Types: 6 Glasses of wine per week  . Drug use: No   Social History   Social History Narrative   Marital status is separated   Employed as a Education officer, community   One son to daughters born in the 1980s, he has one grandchild and 1 great grandchild   12/25/2016   Right handed   One story home   No caffeine    Vital Signs:  BP 119/82   Pulse 73   Resp 18   Ht 5' 9"  (1.753 m)   Wt 223 lb (101.2 kg)   SpO2 97%   BMI 32.93 kg/m   Neurological Exam: MENTAL STATUS including orientation to time, place, person, recent and remote memory, attention span and concentration, language, and fund of knowledge is normal.  Speech is not dysarthric.  CRANIAL NERVES: II:  No visual field defects.  Unremarkable fundi.   III-IV-VI: Pupils equal round and reactive to light.  Normal conjugate, extra-ocular eye movements in all directions of gaze.  No nystagmus.  No ptosis.  No eyelid myotonia V:  Normal facial sensation.    VII:  Normal facial symmetry and movements.   VIII:  Normal hearing and vestibular function.   IX-X:  Normal palatal movement.   XI:  Normal shoulder shrug and head rotation.   XII:  Normal tongue strength and range of motion, no deviation or fasciculation.  MOTOR:  No atrophy, fasciculations or abnormal movements.  No pronator drift. No grip or percussion myotonia  Upper Extremity:  Right  Left  Deltoid  5/5   5/5   Biceps  5/5   5/5   Triceps  5/5   5/5   Infraspinatus 5/5  5/5  Medial pectoralis 5/5  5/5  Wrist extensors  5/5   5/5   Wrist flexors  5/5   5/5   Finger extensors  5/5   5/5   Finger flexors  5/5   5/5   Dorsal  interossei  5/5   5/5   Abductor pollicis  5/5   5/5   Tone (Ashworth scale)  0  0   Lower Extremity:  Right  Left  Hip flexors  5/5   5/5   Hip extensors  5/5   5/5   Adductor 5/5  5/5  Abductor 5/5  5/5  Knee flexors  5/5  5/5   Knee extensors  5/5   5/5   Dorsiflexors  5/5   5/5   Plantarflexors  5/5   5/5   Toe extensors  5/5   5/5   Toe flexors  5/5   5/5   Tone (Ashworth scale)  0  0   MSRs:  Right        Left                  brachioradialis 2+  2+  biceps 2+  2+  triceps 2+  2+  patellar 2+  2+  ankle jerk 2+  2+  Hoffman no  no  plantar response down  down   SENSORY:  Normal and symmetric perception of light touch, pinprick, vibration, and proprioception.  Romberg's sign absent.   COORDINATION/GAIT: Normal finger-to- nose-finger and heel-to-shin.  Intact rapid alternating movements bilaterally.  Able to rise from a chair without using arms.  Gait narrow based and stable. Tandem and stressed gait intact.    IMPRESSION: HyperCKemia and muscle cramps in the setting of normal motor exam.   Overall suspicion for immune-mediated myopathy is low, since symptoms have been longstanding.  I will screen him for potential causes and order NCS/EMG to look for myopathic changes.   PLAN/RECOMMENDATIONS:  Check ESR, CRP, MMA, vitamin E, myositis panel, CK NCS/EMG of the left arm and leg Consider genetic testing going forward Abstain from alcohol to see if this is playing a role Stay well-hydrated  Further recommendations pending results.    Thank you for allowing me to participate in patient's care.  If I can answer any additional questions, I would be pleased to do so.    Sincerely,    Sokhna Christoph K. Posey Pronto, DO

## 2020-05-14 LAB — METHYLMALONIC ACID, SERUM: Methylmalonic Acid, Quant: 205 nmol/L (ref 87–318)

## 2020-06-01 DIAGNOSIS — R7303 Prediabetes: Secondary | ICD-10-CM | POA: Insufficient documentation

## 2020-06-01 LAB — VITAMIN E
Gamma-Tocopherol (Vit E): 2.4 mg/L (ref <=4.3)
Vitamin E (Alpha Tocopherol): 11.4 mg/L (ref 5.7–19.9)

## 2020-06-01 LAB — MYOSITIS ASSESSR PLUS JO-1 AUTOABS
EJ Autoabs: NOT DETECTED
Jo-1 Autoabs: <1 AI
Ku Autoabs: NOT DETECTED
Mi-2 Autoabs: NOT DETECTED
OJ Autoabs: NOT DETECTED
PL-12 Autoabs: NOT DETECTED
PL-7 Autoabs: NOT DETECTED
SRP Autoabs: NOT DETECTED

## 2020-06-02 ENCOUNTER — Other Ambulatory Visit: Payer: Self-pay | Admitting: Family Medicine

## 2020-06-02 DIAGNOSIS — I10 Essential (primary) hypertension: Secondary | ICD-10-CM

## 2020-06-07 ENCOUNTER — Encounter: Payer: 59 | Admitting: Neurology

## 2020-06-08 ENCOUNTER — Telehealth: Payer: Self-pay

## 2020-06-08 NOTE — Telephone Encounter (Signed)
Called patient and informed him of results. Patient informed me that he canceled his f/u and will not be coming back. Patient informed me that his primary care doctor would be handling his care and has done blood work and patient has been put on Vitamin D by his PCP.

## 2020-06-08 NOTE — Telephone Encounter (Signed)
-----   Message from Alda Berthold, DO sent at 06/07/2020 12:08 PM EDT ----- Please inform patient that his labs show mildly elevated CK and otherwise normal. The next step is nerve and muscle tested, please have him contact the front desk to schedule.  Thanks.

## 2020-06-28 ENCOUNTER — Encounter: Payer: Self-pay | Admitting: Gastroenterology

## 2020-08-10 ENCOUNTER — Other Ambulatory Visit: Payer: Self-pay

## 2020-08-10 ENCOUNTER — Ambulatory Visit (AMBULATORY_SURGERY_CENTER): Payer: Self-pay | Admitting: *Deleted

## 2020-08-10 VITALS — Ht 69.0 in | Wt 228.0 lb

## 2020-08-10 DIAGNOSIS — Z86010 Personal history of colonic polyps: Secondary | ICD-10-CM

## 2020-08-10 MED ORDER — SUPREP BOWEL PREP KIT 17.5-3.13-1.6 GM/177ML PO SOLN: 1.0000 | Freq: Once | ORAL | 0 refills | Status: AC

## 2020-08-10 NOTE — Progress Notes (Signed)
J and J vax  No egg or soy allergy known to patient  No issues with past sedation with any surgeries or procedures no intubation problems in the past  No FH of Malignant Hyperthermia No diet pills per patient No home 02 use per patient  No blood thinners per patient  Pt denies issues with constipation  No A fib or A flutter  EMMI video to pt or via [place] 19 guidelines implemented in PV today with Pt and RN    Due to the COVID-19 pandemic we are asking patients to follow these guidelines. Please only bring one care partner. Please be aware that your care partner may wait in the car in the parking lot or if they feel like they will be too hot to wait in the car, they may wait in the lobby on the 4th floor. All care partners are required to wear a mask the entire time (we do not have any that we can provide them), they need to practice social distancing, and we will do a Covid check for all patient's and care partners when you arrive. Also we will check their temperature and your temperature. If the care partner waits in their car they need to stay in the parking lot the entire time and we will call them on their cell phone when the patient is ready for discharge so they can bring the car to the front of the building. Also all patient's will need to wear a mask into building.

## 2020-08-11 ENCOUNTER — Telehealth: Payer: Self-pay | Admitting: Internal Medicine

## 2020-08-11 MED ORDER — NA SULFATE-K SULFATE-MG SULF 17.5-3.13-1.6 GM/177ML PO SOLN: 1.0000 | Freq: Once | ORAL | 0 refills | Status: AC

## 2020-08-11 NOTE — Telephone Encounter (Signed)
Patient informed that suprep has been sent in to [facility] as requested.

## 2020-08-22 ENCOUNTER — Encounter: Payer: Self-pay | Admitting: Internal Medicine

## 2020-08-22 ENCOUNTER — Ambulatory Visit (AMBULATORY_SURGERY_CENTER): Payer: 59 | Admitting: Internal Medicine

## 2020-08-22 ENCOUNTER — Other Ambulatory Visit: Payer: Self-pay

## 2020-08-22 VITALS — BP 148/75 | HR 73 | Temp 96.0°F | Resp 19 | Ht 69.0 in | Wt 228.0 lb

## 2020-08-22 DIAGNOSIS — Z86010 Personal history of colonic polyps: Secondary | ICD-10-CM

## 2020-08-22 MED ORDER — SODIUM CHLORIDE 0.9 % IV SOLN: 500.0000 mL | Freq: Once | INTRAVENOUS | Status: DC

## 2020-08-22 NOTE — Op Note (Signed)
[facility] Patient Name: [name] Procedure Date: 08/22/2020 2:12 PM MRN: [id number] Endoscopist: Gatha Mayer , MD Age: 64 Referring MD:  Date of Birth: [birth year] Gender: Male Account #: [id number] Procedure:                Colonoscopy Indications:              Surveillance: Personal history of adenomatous                            polyps on last colonoscopy 3 years ago, Last                            colonoscopy: 2018 Medicines:                Propofol per Anesthesia, Monitored Anesthesia Care Procedure:                Pre-Anesthesia Assessment:                           - Prior to the procedure, a History and Physical                            was performed, and patient medications and                            allergies were reviewed. The patient's tolerance of                            previous anesthesia was also reviewed. The risks                            and benefits of the procedure and the sedation                            options and risks were discussed with the patient.                            All questions were answered, and informed consent                            was obtained. Prior Anticoagulants: The patient has                            taken no previous anticoagulant or antiplatelet                            agents. ASA Grade Assessment: II - A patient with                            mild systemic disease. After reviewing the risks                            and benefits, the patient was deemed in  satisfactory condition to undergo the procedure.                           After obtaining informed consent, the colonoscope                            was passed under direct vision. Throughout the                            procedure, the patient's blood pressure, pulse, and                            oxygen saturations were monitored continuously. The                            Colonoscope was introduced  through the anus and                            advanced to the the cecum, identified by                            appendiceal orifice and ileocecal valve. The                            colonoscopy was performed without difficulty. The                            patient tolerated the procedure well. The quality                            of the bowel preparation was good. The ileocecal                            valve, appendiceal orifice, and rectum were                            photographed. The bowel preparation used was SUPREP                            via split dose instruction. Scope In: 2:21:58 PM Scope Out: 2:31:47 PM Scope Withdrawal Time: 0 hours 7 minutes 44 seconds  Total Procedure Duration: 0 hours 9 minutes 49 seconds  Findings:                 The perianal and digital rectal examinations were                            normal. Pertinent negatives include normal prostate                            (size, shape, and consistency).                           Multiple small and large-mouthed diverticula were  found in the entire colon.                           External hemorrhoids were found.                           The exam was otherwise without abnormality on                            direct and retroflexion views. Complications:            No immediate complications. Estimated Blood Loss:     Estimated blood loss: none. Impression:               - Diverticulosis in the entire examined colon.                           - External hemorrhoids.                           - The examination was otherwise normal on direct                            and retroflexion views.                           - No specimens collected.                           - Personal history of colonic polyp 24mm adenoma                            2018. Recommendation:           - Patient has a contact number available for                            emergencies. The signs  and symptoms of potential                            delayed complications were discussed with the                            patient. Return to normal activities tomorrow.                            Written discharge instructions were provided to the                            patient.                           - Resume previous diet.                           - Continue present medications.                           -  Repeat colonoscopy in 5 years for surveillance.                           - he has had elevated CK and myalgias/cramps -                            neurology evaluation started - they have                            recommended an EMG but not clear patient was aware                            - I have recommended he contact Dr. Serita Grit office. Gatha Mayer, MD 08/22/2020 2:42:06 PM This report has been signed electronically.

## 2020-08-22 NOTE — Progress Notes (Signed)
VS- Riki Sheer LPN Pt's states no medical or surgical changes since previsit or office visit.

## 2020-08-22 NOTE — Progress Notes (Signed)
A/ox3, pleased with MAC, report to RN 

## 2020-08-22 NOTE — Patient Instructions (Addendum)
I did not find any polyps today.  Next routine repeat colonoscopy in 2026 which is 5 years from now.  I did see a little pockets called diverticulosis and you have some swollen hemorrhoids.  Hemorrhoids often get swollen with the cleanout for the colonoscopy.  I looked at your chart and the neurologist, Dr. Posey Pronto,  did want you to have some additional testing to try to understand your muscle cramps and the abnormal lab tests.  I suggest you contact her office about that.  I am thinking somehow they did not get a hold of you and let you know that.  I appreciate the opportunity to care for you. Gatha Mayer, MD, [facility]   Handouts given for diverticulosis and hemorrhoids.   YOU HAD AN ENDOSCOPIC PROCEDURE TODAY AT [facility]:   Refer to the procedure report that was given to you for any specific questions about what was found during the examination.  If the procedure report does not answer your questions, please call your gastroenterologist to clarify.  If you requested that your care partner not be given the details of your procedure findings, then the procedure report has been included in a sealed envelope for you to review at your convenience later.  YOU SHOULD EXPECT: Some feelings of bloating in the abdomen. Passage of more gas than usual.  Walking can help get rid of the air that was put into your GI tract during the procedure and reduce the bloating. If you had a lower endoscopy (such as a colonoscopy or flexible sigmoidoscopy) you may notice spotting of blood in your stool or on the toilet paper. If you underwent a bowel prep for your procedure, you may not have a normal bowel movement for a few days.  Please Note:  You might notice some irritation and congestion in your nose or some drainage.  This is from the oxygen used during your procedure.  There is no need for concern and it should clear up in a day or so.  SYMPTOMS TO REPORT IMMEDIATELY:   Following lower  endoscopy (colonoscopy or flexible sigmoidoscopy):  Excessive amounts of blood in the stool  Significant tenderness or worsening of abdominal pains  Swelling of the abdomen that is new, acute  Fever of 100F or higher  For urgent or emergent issues, a gastroenterologist can be reached at any hour by calling [phone]. Do not use MyChart messaging for urgent concerns.    DIET:  We do recommend a small meal at first, but then you may proceed to your regular diet.  Drink plenty of fluids but you should avoid alcoholic beverages for 24 hours.  ACTIVITY:  You should plan to take it easy for the rest of today and you should NOT DRIVE or use heavy machinery until tomorrow (because of the sedation medicines used during the test).    FOLLOW UP: Our staff will call the number listed on your records 48-72 hours following your procedure to check on you and address any questions or concerns that you may have regarding the information given to you following your procedure. If we do not reach you, we will leave a message.  We will attempt to reach you two times.  During this call, we will ask if you have developed any symptoms of COVID 19. If you develop any symptoms (ie: fever, flu-like symptoms, shortness of breath, cough etc.) before then, please call [phone].  If you test positive for Covid 19 in the 2 weeks  post procedure, please call and report this information to [place].    If any biopsies were taken you will be contacted by phone or by letter within the next 1-3 weeks.  Please call us at [phone] if you have not heard about the biopsies in 3 weeks.    SIGNATURES/CONFIDENTIALITY: You and/or your care partner have signed paperwork which will be entered into your electronic medical record.  These signatures attest to the fact that that the information above on your After Visit Summary has been reviewed and is understood.  Full responsibility of the confidentiality of this discharge  information lies with you and/or your care-partner.

## 2020-08-24 ENCOUNTER — Telehealth: Payer: Self-pay | Admitting: *Deleted

## 2020-08-24 ENCOUNTER — Encounter: Payer: 59 | Admitting: Gastroenterology

## 2020-08-24 ENCOUNTER — Telehealth: Payer: Self-pay

## 2020-08-24 NOTE — Telephone Encounter (Signed)
  Follow up Call-  Call back number 08/22/2020  Post procedure Call Back phone  # [phone]  Permission to leave phone message Yes  Some recent data might be hidden     Patient questions:  Do you have a fever, pain , or abdominal swelling? No. Pain Score  0 *  Have you tolerated food without any problems? Yes.    Have you been able to return to your normal activities? Yes.    Do you have any questions about your discharge instructions: Diet   No. Medications  No. Follow up visit  No.  Do you have questions or concerns about your Care? No.  Actions: * If pain score is 4 or above: No action needed, pain <4.   1. Have you developed a fever since your procedure? no  2.   Have you had an respiratory symptoms (SOB or cough) since your procedure? no  3.   Have you tested positive for COVID 19 since your procedure no  4.   Have you had any family members/close contacts diagnosed with the COVID 19 since your procedure?  no   If yes to any of these questions please route to Joylene John, RN and Joella Prince, RN

## 2020-08-24 NOTE — Telephone Encounter (Signed)
Attempted to reach patient for post-procedure f/u call. No answer. Left message that we will make another attempt to reach him later today and for him to please not hesitate to call us if he has any questions/concerns regarding his care. 

## 2020-08-31 ENCOUNTER — Other Ambulatory Visit: Payer: Self-pay | Admitting: Family Medicine

## 2020-12-17 IMAGING — DX DG CHEST 2V
2 series · 2 of 2 positions shown · non-contrast
Comparison: Chest x-ray dated 01/25/2016 and CT scan of the abdomen
dated 12/29/2015

CLINICAL DATA: Chest pain.

EXAM:
CHEST - 2 VIEW

[chest pa]
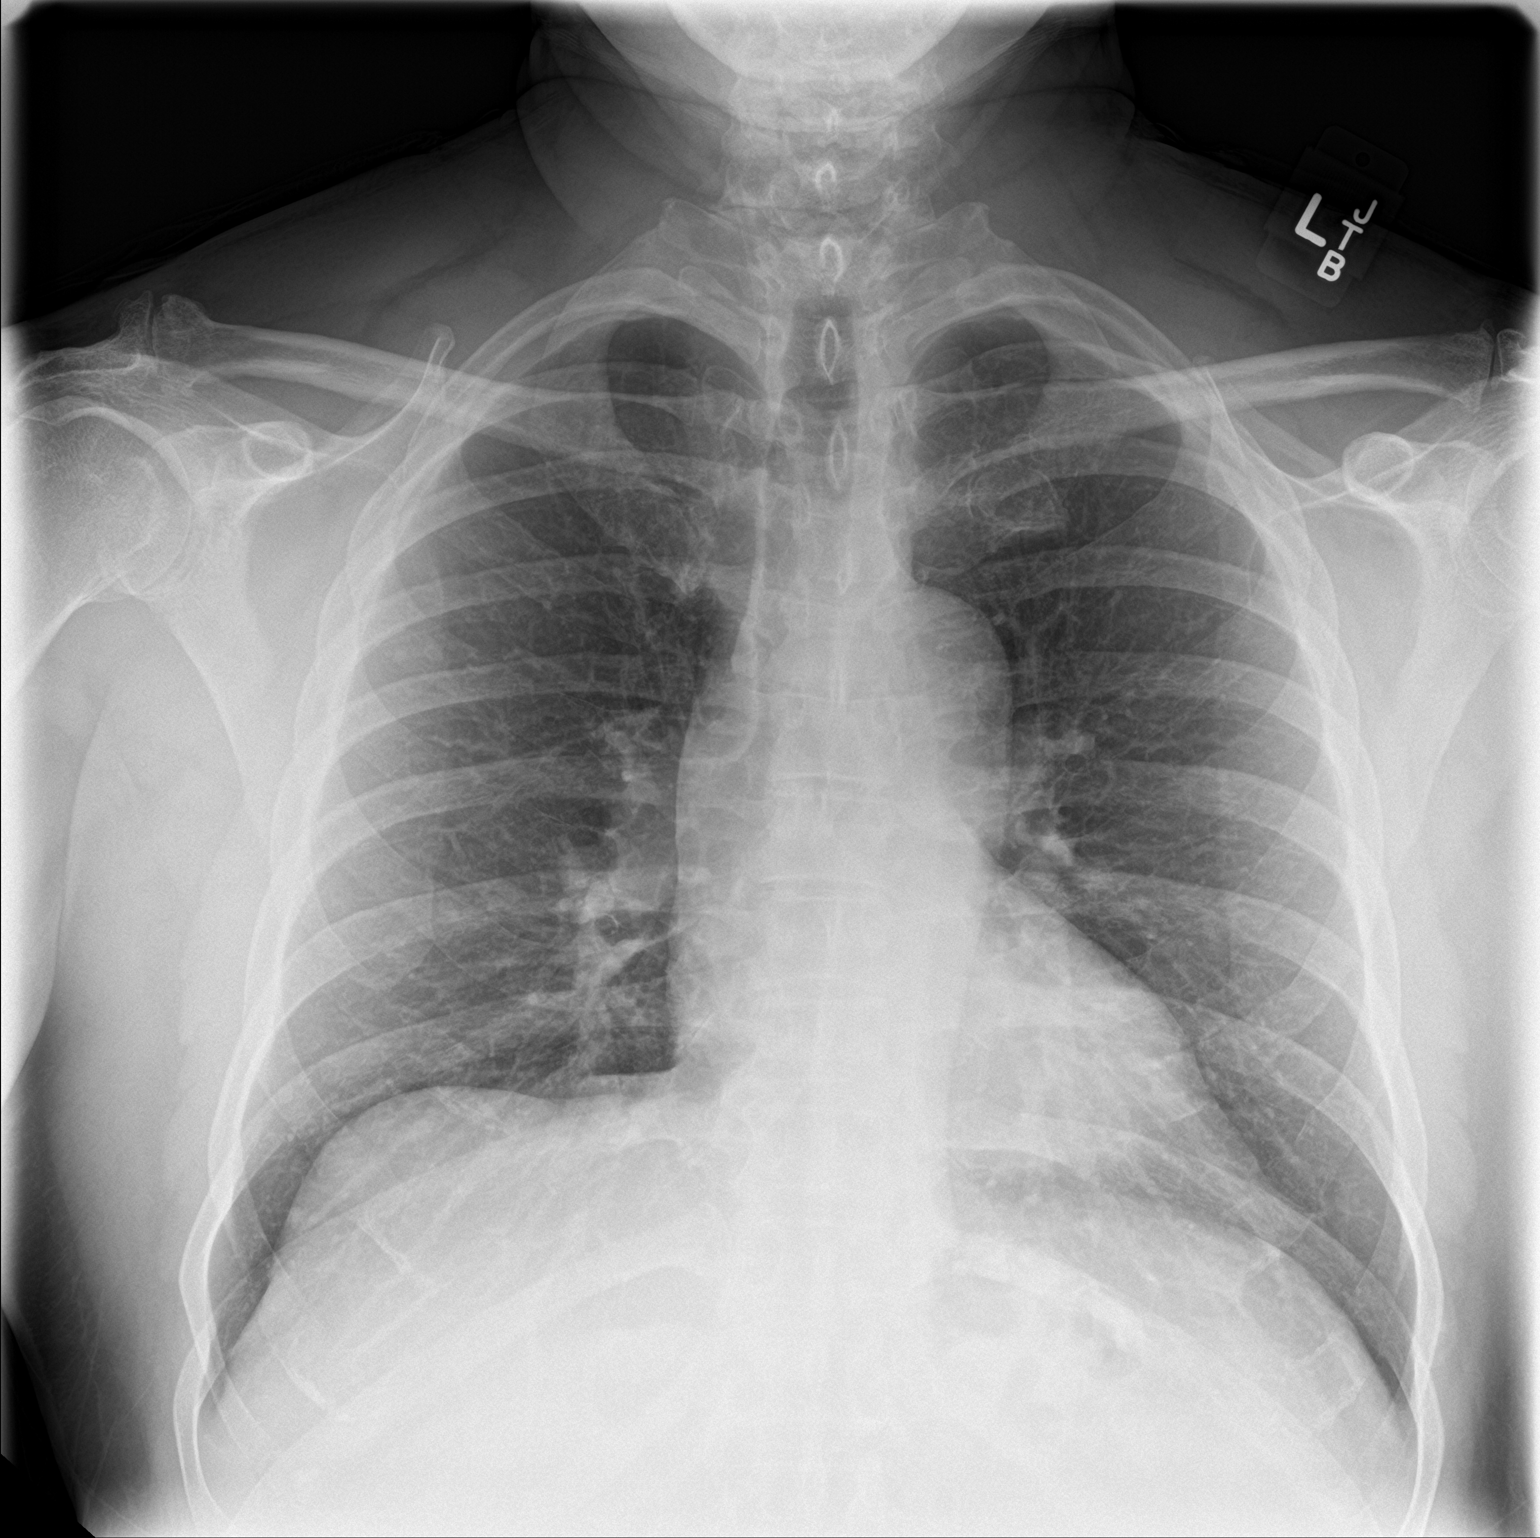

[chest lat]
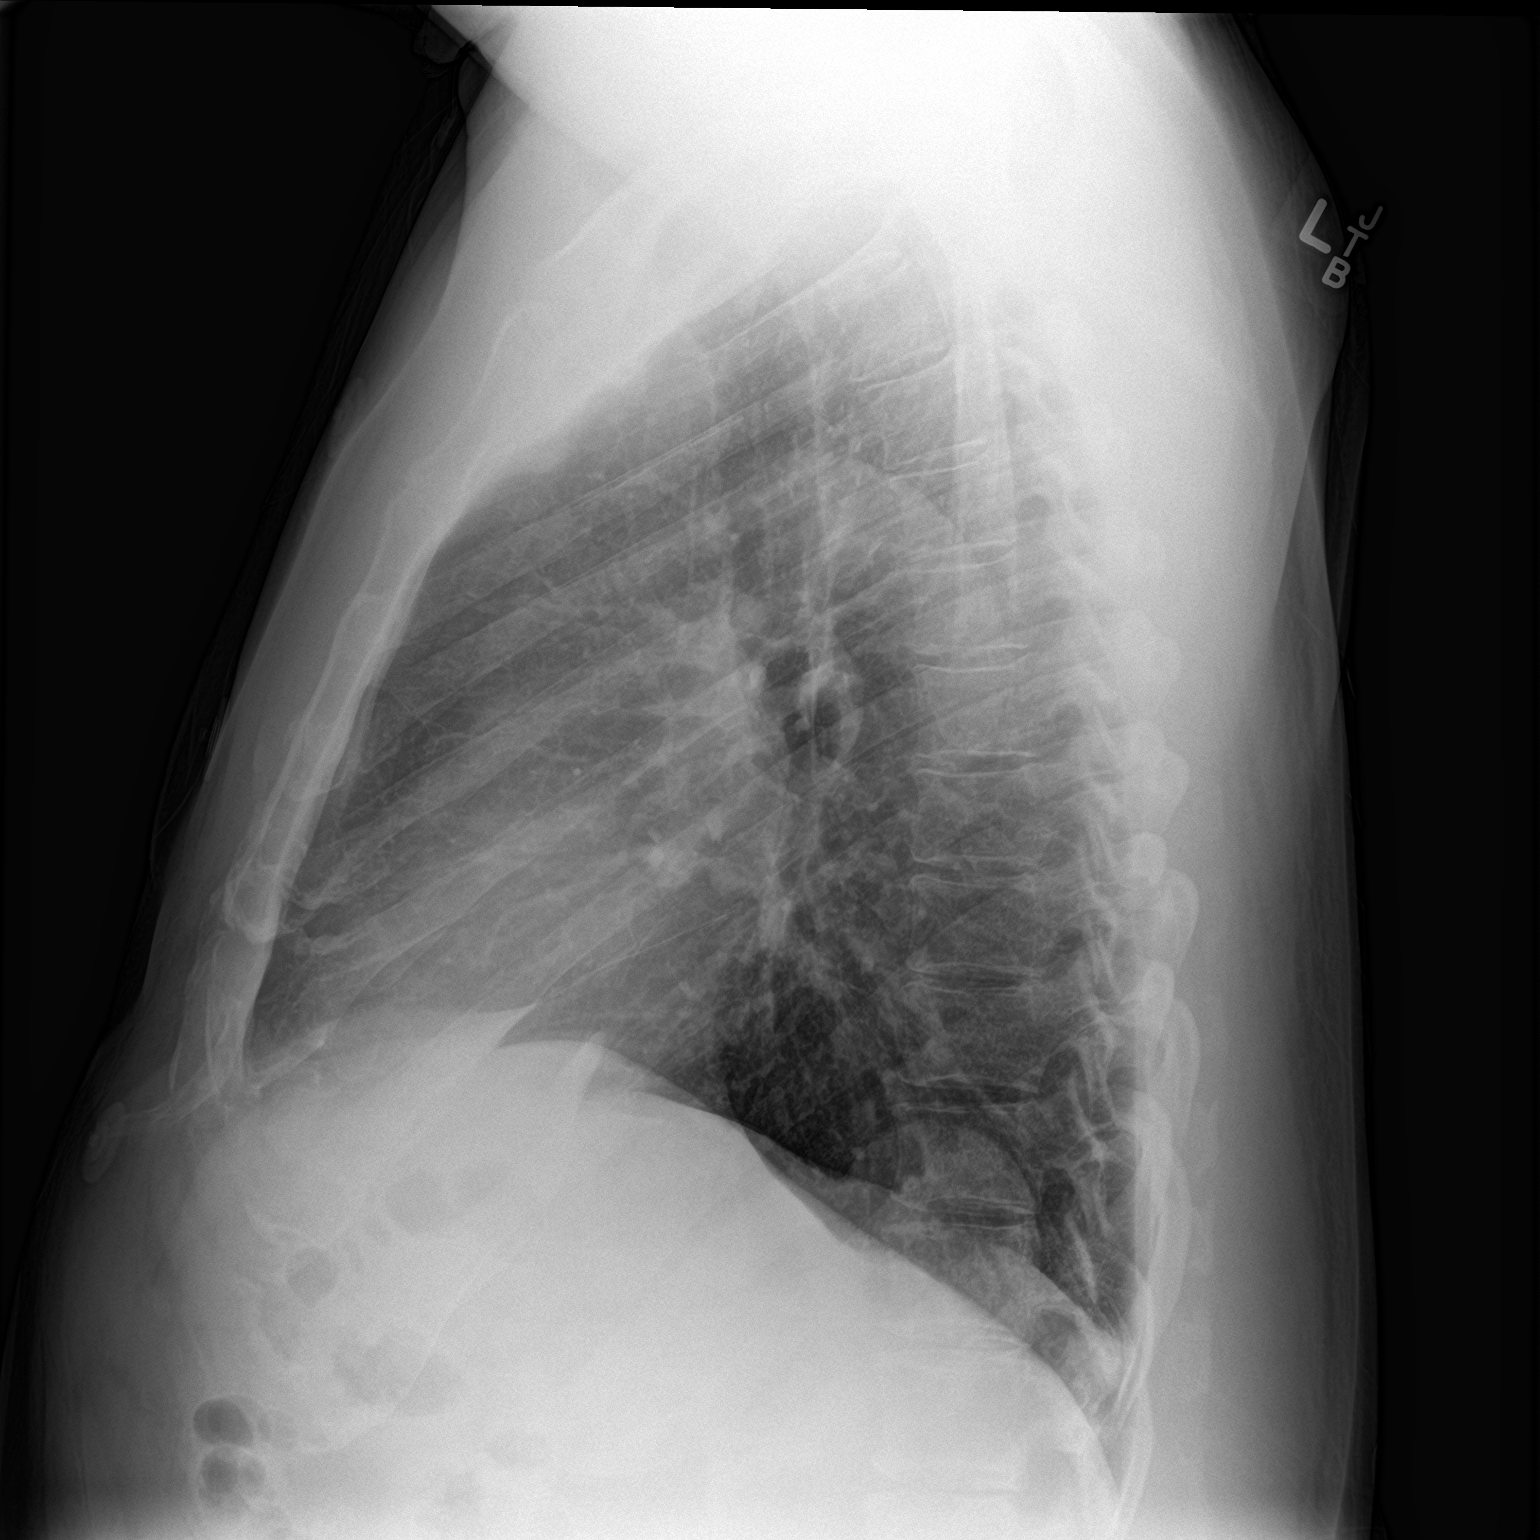

[2 of 2 positions shown; findings below may reference images not displayed]

FINDINGS: The heart size and mediastinal contours are within normal limits.
Both lungs are clear. The visualized skeletal structures are
unremarkable. Small left posterior diaphragmatic hernia as
demonstrated on the prior CT scan of 12/29/2015.
IMPRESSION: No active cardiopulmonary disease.

## 2021-02-27 ENCOUNTER — Encounter: Payer: 59 | Admitting: Family Medicine

## 2021-03-29 DIAGNOSIS — E7800 Pure hypercholesterolemia, unspecified: Secondary | ICD-10-CM | POA: Insufficient documentation

## 2021-08-07 ENCOUNTER — Other Ambulatory Visit: Payer: Self-pay

## 2021-08-07 ENCOUNTER — Ambulatory Visit (INDEPENDENT_AMBULATORY_CARE_PROVIDER_SITE_OTHER): Payer: 59 | Admitting: Podiatry

## 2021-08-07 ENCOUNTER — Ambulatory Visit (INDEPENDENT_AMBULATORY_CARE_PROVIDER_SITE_OTHER): Payer: 59

## 2021-08-07 DIAGNOSIS — M1000 Idiopathic gout, unspecified site: Secondary | ICD-10-CM

## 2021-08-07 DIAGNOSIS — M79672 Pain in left foot: Secondary | ICD-10-CM

## 2021-08-07 DIAGNOSIS — I739 Peripheral vascular disease, unspecified: Secondary | ICD-10-CM | POA: Diagnosis not present

## 2021-08-07 DIAGNOSIS — M79671 Pain in right foot: Secondary | ICD-10-CM

## 2021-08-07 DIAGNOSIS — M7661 Achilles tendinitis, right leg: Secondary | ICD-10-CM | POA: Diagnosis not present

## 2021-08-07 DIAGNOSIS — G8929 Other chronic pain: Secondary | ICD-10-CM

## 2021-08-07 DIAGNOSIS — Z01818 Encounter for other preprocedural examination: Secondary | ICD-10-CM

## 2021-08-07 MED ORDER — MELOXICAM 7.5 MG PO TABS: 7.5000 mg | ORAL_TABLET | Freq: Every day | ORAL | 0 refills | Status: DC | PRN

## 2021-08-07 NOTE — Patient Instructions (Signed)
Achilles Tendinitis  with Rehab Achilles tendinitis is a disorder of the Achilles tendon. The Achilles tendon connects the large calf muscles (Gastrocnemius and Soleus) to the heel bone (calcaneus). This tendon is sometimes called the heel cord. It is important for pushing-off and standing on your toes and is important for walking, running, or jumping. Tendinitis is often caused by overuse and repetitive microtrauma. SYMPTOMS  Pain, tenderness, swelling, warmth, and redness may occur over the Achilles tendon even at rest.  Pain with pushing off, or flexing or extending the ankle.  Pain that is worsened after or during activity. CAUSES   Overuse sometimes seen with rapid increase in exercise programs or in sports requiring running and jumping.  Poor physical conditioning (strength and flexibility or endurance).  Running sports, especially training running down hills.  Inadequate warm-up before practice or play or failure to stretch before participation.  Injury to the tendon. PREVENTION   Warm up and stretch before practice or competition.  Allow time for adequate rest and recovery between practices and competition.  Keep up conditioning.  Keep up ankle and leg flexibility.  Improve or keep muscle strength and endurance.  Improve cardiovascular fitness.  Use proper technique.  Use proper equipment (shoes, skates).  To help prevent recurrence, taping, protective strapping, or an adhesive bandage may be recommended for several weeks after healing is complete. PROGNOSIS   Recovery may take weeks to several months to heal.  Longer recovery is expected if symptoms have been prolonged.  Recovery is usually quicker if the inflammation is due to a direct blow as compared with overuse or sudden strain. RELATED COMPLICATIONS   Healing time will be prolonged if the condition is not correctly treated. The injury must be given plenty of time to heal.  Symptoms can reoccur if  activity is resumed too soon.  Untreated, tendinitis may increase the risk of tendon rupture requiring additional time for recovery and possibly surgery. TREATMENT   The first treatment consists of rest anti-inflammatory medication, and ice to relieve the pain.  Stretching and strengthening exercises after resolution of pain will likely help reduce the risk of recurrence. Referral to a physical therapist or athletic trainer for further evaluation and treatment may be helpful.  A walking boot or cast may be recommended to rest the Achilles tendon. This can help break the cycle of inflammation and microtrauma.  Arch supports (orthotics) may be prescribed or recommended by your caregiver as an adjunct to therapy and rest.  Surgery to remove the inflamed tendon lining or degenerated tendon tissue is rarely necessary and has shown less than predictable results. MEDICATION   Nonsteroidal anti-inflammatory medications, such as aspirin and ibuprofen, may be used for pain and inflammation relief. Do not take within 7 days before surgery. Take these as directed by your caregiver. Contact your caregiver immediately if any bleeding, stomach upset, or signs of allergic reaction occur. Other minor pain relievers, such as acetaminophen, may also be used.  Pain relievers may be prescribed as necessary by your caregiver. Do not take prescription pain medication for longer than 4 to 7 days. Use only as directed and only as much as you need.  Cortisone injections are rarely indicated. Cortisone injections may weaken tendons and predispose to rupture. It is better to give the condition more time to heal than to use them. HEAT AND COLD  Cold is used to relieve pain and reduce inflammation for acute and chronic Achilles tendinitis. Cold should be applied for 10 to 15 minutes   every 2 to 3 hours for inflammation and pain and immediately after any activity that aggravates your symptoms. Use ice packs or an ice  massage.  Heat may be used before performing stretching and strengthening activities prescribed by your caregiver. Use a heat pack or a warm soak. SEEK MEDICAL CARE IF:  Symptoms get worse or do not improve in 2 weeks despite treatment.  New, unexplained symptoms develop. Drugs used in treatment may produce side effects.  EXERCISES:  RANGE OF MOTION (ROM) AND STRETCHING EXERCISES - Achilles Tendinitis  These exercises may help you when beginning to rehabilitate your injury. Your symptoms may resolve with or without further involvement from your physician, physical therapist or athletic trainer. While completing these exercises, remember:   Restoring tissue flexibility helps normal motion to return to the joints. This allows healthier, less painful movement and activity.  An effective stretch should be held for at least 30 seconds.  A stretch should never be painful. You should only feel a gentle lengthening or release in the stretched tissue.  STRETCH  Gastroc, Standing   Place hands on wall.  Extend right / left leg, keeping the front knee somewhat bent.  Slightly point your toes inward on your back foot.  Keeping your right / left heel on the floor and your knee straight, shift your weight toward the wall, not allowing your back to arch.  You should feel a gentle stretch in the right / left calf. Hold this position for 10 seconds. Repeat 3 times. Complete this stretch 2 times per day.  STRETCH  Soleus, Standing   Place hands on wall.  Extend right / left leg, keeping the other knee somewhat bent.  Slightly point your toes inward on your back foot.  Keep your right / left heel on the floor, bend your back knee, and slightly shift your weight over the back leg so that you feel a gentle stretch deep in your back calf.  Hold this position for 10 seconds. Repeat 3 times. Complete this stretch 2 times per day.  STRETCH  Gastrocsoleus, Standing  Note: This exercise can place  a lot of stress on your foot and ankle. Please complete this exercise only if specifically instructed by your caregiver.   Place the ball of your right / left foot on a step, keeping your other foot firmly on the same step.  Hold on to the wall or a rail for balance.  Slowly lift your other foot, allowing your body weight to press your heel down over the edge of the step.  You should feel a stretch in your right / left calf.  Hold this position for 10 seconds.  Repeat this exercise with a slight bend in your knee. Repeat 3 times. Complete this stretch 2 times per day.   STRENGTHENING EXERCISES - Achilles Tendinitis These exercises may help you when beginning to rehabilitate your injury. They may resolve your symptoms with or without further involvement from your physician, physical therapist or athletic trainer. While completing these exercises, remember:   Muscles can gain both the endurance and the strength needed for everyday activities through controlled exercises.  Complete these exercises as instructed by your physician, physical therapist or athletic trainer. Progress the resistance and repetitions only as guided.  You may experience muscle soreness or fatigue, but the pain or discomfort you are trying to eliminate should never worsen during these exercises. If this pain does worsen, stop and make certain you are following the directions exactly. If   push your toes away from you, pointing them downward. Hold this position for 10 seconds. Return slowly, controlling the tension in the band/tubing. Repeat 3 times. Complete this exercise 2 times per day.   STRENGTH - Plantar-flexors  Stand with your feet shoulder width apart. Steady yourself with a wall or table  using as little support as needed. Keeping your weight evenly spread over the width of your feet, rise up on your toes.* Hold this position for 10 seconds. Repeat 3 times. Complete this exercise 2 times per day.  *If this is too easy, shift your weight toward your right / left leg until you feel challenged. Ultimately, you may be asked to do this exercise with your right / left foot only.  STRENGTH  Plantar-flexors, Eccentric  Note: This exercise can place a lot of stress on your foot and ankle. Please complete this exercise only if specifically instructed by your caregiver.  Place the balls of your feet on a step. With your hands, use only enough support from a wall or rail to keep your balance. Keep your knees straight and rise up on your toes. Slowly shift your weight entirely to your right / left toes and pick up your opposite foot. Gently and with controlled movement, lower your weight through your right / left foot so that your heel drops below the level of the step. You will feel a slight stretch in the back of your calf at the end position. Use the healthy leg to help rise up onto the balls of both feet, then lower weight only on the right / left leg again. Build up to 15 repetitions. Then progress to 3 consecutive sets of 15 repetitions.* After completing the above exercise, complete the same exercise with a slight knee bend (about 30 degrees). Again, build up to 15 repetitions. Then progress to 3 consecutive sets of 15 repetitions.* Perform this exercise 2 times per day.  *When you easily complete 3 sets of 15, your physician, physical therapist or athletic trainer may advise you to add resistance by wearing a backpack filled with additional weight.  STRENGTH - Plantar Flexors, Seated  Sit on a chair that allows your feet to rest flat on the ground. If necessary, sit at the edge of the chair. Keeping your toes firmly on the ground, lift your right / left heel as far as you can without  increasing any discomfort in your ankle. Repeat 3 times. Complete this exercise 2 times a day. Bunion A bunion (hallux valgus) is a bump that forms slowly on the inner side of the big toe joint. It occurs when the big toe turns toward the second toe. Bunions may be small at first, but they often get larger over time. They can make walking painful. What are the causes? This condition may be caused by: Wearing narrow or pointed shoes that force the big toe to press against the other toes. Abnormal foot development that causes the foot to roll inward. Changes in the foot that are caused by certain diseases, such as rheumatoid arthritis or polio. A foot injury. What increases the risk? The following factors may make you more likely to develop this condition: Wearing shoes that squeeze the toes together. Having certain diseases, such as: Rheumatoid arthritis. Polio. Cerebral palsy. Having family members who have bunions. Being born with abnormally shaped feet (a foot deformity), such as flat feet or low arches. Doing activities that put a lot of pressure on the feet, such as ballet dancing.  What are the signs or symptoms? The main symptom of this condition is a bump on your big toe that you can notice. Other symptoms may include: Pain. Redness and inflammation around your big toe. Thick or hardened skin on your big toe or between your toes. Stiffness or loss of motion in your big toe. Trouble with walking. How is this diagnosed? This condition may be diagnosed based on your symptoms, medical history, and activities. You may also have tests and imaging, such as: X-rays. These allow your health care provider to check the position of the bones in your foot and look for damage to your joint. They also help your health care provider determine the severity of your bunion and the best way to treat it. Joint aspiration. In this test, a sample of fluid is removed from the toe joint. This test may be  done if you are in a lot of pain. It helps rule out diseases that cause painful swelling of the joints, such as arthritis or gout. How is this treated? Treatment depends on the severity of your symptoms. The goal of treatment is to relieve symptoms and prevent your bunion from getting worse. Your health care provider may recommend: Wearing shoes that have a wide toe box, or using bunion pads to cushion the affected area. Taping your toes together to keep them in a normal position. Placing a device inside your shoe (orthotic device) to help reduce pressure on your toe joint. Taking medicine to ease pain and inflammation. Putting ice or heat on the affected area. Doing stretching exercises. Surgery, for severe cases. Follow these instructions at home: Managing pain, stiffness, and swelling   If directed, put ice on the painful area. To do this: Put ice in a plastic bag. Place a towel between your skin and the bag. Leave the ice on for 20 minutes, 2-3 times a day. Remove the ice if your skin turns bright red. This is very important. If you cannot feel pain, heat, or cold, you have a greater risk of damage to the area. If directed, apply heat to the affected area before you exercise. Use the heat source that your health care provider recommends, such as a moist heat pack or a heating pad. Place a towel between your skin and the heat source. Leave the heat on for 20-30 minutes. Remove the heat if your skin turns bright red. This is especially important if you are unable to feel pain, heat, or cold. You have a greater risk of getting burned. General instructions Do exercises as told by your health care provider. Support your toe joint with proper footwear, shoe padding, or taping as told by your health care provider. Take over-the-counter and prescription medicines only as told by your health care provider. Do not use any products that contain nicotine or tobacco, such as cigarettes,  e-cigarettes, and chewing tobacco. If you need help quitting, ask your health care provider. Keep all follow-up visits. This is important. Contact a health care provider if: Your symptoms get worse. Your symptoms do not improve in 2 weeks. Get help right away if: You have severe pain and trouble with walking. Summary A bunion is a bump on the inner side of the big toe joint that forms when the big toe turns toward the second toe. Bunions can make walking painful. Treatment depends on the severity of your symptoms. Support your toe joint with proper footwear, shoe padding, or taping as told by your health care provider. This information  is not intended to replace advice given to you by your health care provider. Make sure you discuss any questions you have with your health care provider. Document Revised: 01/14/2020 Document Reviewed: 01/14/2020 Elsevier Patient Education  2022 [employer].

## 2021-08-10 ENCOUNTER — Other Ambulatory Visit: Payer: Self-pay | Admitting: Podiatry

## 2021-08-10 DIAGNOSIS — G8929 Other chronic pain: Secondary | ICD-10-CM

## 2021-08-10 DIAGNOSIS — M1000 Idiopathic gout, unspecified site: Secondary | ICD-10-CM

## 2021-08-10 NOTE — Progress Notes (Signed)
Subjective:   Patient ID: [name], male   DOB: 65 y.o.   MRN: [id number]   HPI 65 year old male presents the office today with his main concern of pain to his left foot pointing along the area of the bunion.  He states that the pain is intermittent but is becoming more consistent.  He does state he gets swelling at times.  Ice does help with this.  He states he has been treated for gout but does not feel that this is gout and is asking about surgical intervention to help with the bunion.  He states that time he gets pain to his left leg and sometimes it feels like he has "poor circulation".  He does have a history of an injury to the left big toe and states he sprained the toe years ago and since then the toes starting to turn inwards as well.  Also has secondary concerns of pain in the posterior aspect of the right heel.  He states that when he is on a treadmill or walking or jumping it aggravates his symptoms.  He has no recent injury that he reports.  He has tried Voltaren gel.  Does not help much.   Review of Systems  All other systems reviewed and are negative.  Past Medical History:  Diagnosis Date   Epididymitis    GERD (gastroesophageal reflux disease)    Gout of big toe 05/21/2013   Hard of hearing    History of gout    Hx of adenomatous polyp of colon 02/01/2017   Hyperlipidemia    Hypertension    Orchitis    Sepsis ([place]) hospitalized 12/25/2015   Urethral stricture     Past Surgical History:  Procedure Laterality Date   COLONOSCOPY     CYSTOSCOPY WITH DIRECT VISION INTERNAL URETHROTOMY N/A 01/25/2016   Procedure:  DIRECT VISION INTERNAL URETHROTOMY;  Surgeon: Ardis Hughs, MD;  Location: [facility];  Service: Urology;  Laterality: N/A;   CYSTOSCOPY WITH RETROGRADE URETHROGRAM N/A 01/25/2016   Procedure:  RETROGRADE URETHROGRAM;  Surgeon: Ardis Hughs, MD;  Location: [facility];  Service: Urology;  Laterality: N/A;   INGUINAL HERNIA REPAIR Right    POLYPECTOMY      UPPER GASTROINTESTINAL ENDOSCOPY     WISDOM TOOTH EXTRACTION       Current Outpatient Medications:    diclofenac Sodium (VOLTAREN) 1 % GEL, Apply topically., Disp: , Rfl:    meloxicam (MOBIC) 7.5 MG tablet, Take 1 tablet (7.5 mg total) by mouth daily as needed for pain., Disp: 10 tablet, Rfl: 0   allopurinol (ZYLOPRIM) 100 MG tablet, Take 100 mg by mouth daily., Disp: , Rfl:    amLODipine (NORVASC) 5 MG tablet, TAKE 1 TABLET(5 MG) BY MOUTH DAILY, Disp: 90 tablet, Rfl: 0   atorvastatin (LIPITOR) 10 MG tablet, Take 10 mg by mouth daily. , Disp: , Rfl:    chlorthalidone (HYGROTON) 25 MG tablet, Take 25 mg by mouth daily., Disp: , Rfl:    colchicine (COLCRYS) 0.6 MG tablet, Take 1 tablet (0.6 mg total) by mouth 2 (two) times daily., Disp: 20 tablet, Rfl: 2   diclofenac sodium (VOLTAREN) 1 % GEL, Apply 2 g topically 4 (four) times daily. (Patient not taking: Reported on 05/11/2020), Disp: 100 g, Rfl: 2   diclofenac Sodium (VOLTAREN) 1 % GEL, Apply 4 g topically 4 (four) times daily. (Patient not taking: Reported on 05/11/2020), Disp: 100 g, Rfl: 1   MAGNESIUM-POTASSIUM PO, Take by mouth., Disp: ,  Rfl:    Multiple Vitamins-Minerals (MENS 50+ MULTI VITAMIN/MIN PO), Take 1 tablet by mouth daily., Disp: , Rfl:    pantoprazole (PROTONIX) 40 MG tablet, Take 1 tablet (40 mg total) by mouth daily., Disp: 90 tablet, Rfl: 1   rosuvastatin (CRESTOR) 20 MG tablet, Take 20 mg by mouth at bedtime., Disp: , Rfl:    sildenafil (REVATIO) 20 MG tablet, SMARTSIG:1-5 Tablet(s) By Mouth Daily PRN, Disp: , Rfl:    valsartan (DIOVAN) 320 MG tablet, Take 320 mg by mouth daily. (Patient not taking: Reported on 08/22/2020), Disp: , Rfl:    Vitamin D, Ergocalciferol, (DRISDOL) 1.25 MG (50000 UNIT) CAPS capsule, Take 50,000 Units by mouth once a week., Disp: , Rfl:   Allergies  Allergen Reactions   Atorvastatin Other (See Comments)    myalgia   Lisinopril Cough         Objective:  Physical Exam  General: AAO x3,  NAD  Dermatological: Skin is warm, dry and supple bilateral. There are no open sores, no preulcerative lesions, no rash or signs of infection present.  Vascular: Dorsalis Pedis artery and Posterior Tibial artery pedal pulses are 2/4 bilateral with immedate capillary fill time.There is no pain with calf compression, swelling, warmth, erythema.   Neruologic: Grossly intact via light touch bilateral.  Negative Tinel sign bilaterally.  Musculoskeletal: Moderate bunions present on the left foot there is tenderness palpation directly on the bunion area.  There is no edema, erythema or any signs of gout presents today.  There is no significant hypermobility of the first ray.  No crepitation or restriction with MPJ range of motion.  On the posterior aspect of the right heel on insertion of the Achilles tendon to the calcaneus there is minimal discomfort.  There is no edema no erythema.  Negative Thompson test.  No pain with lateral compression of calcaneus.  No other areas of discomfort identified bilaterally.  Muscular strength 5/5 in all groups tested bilateral.  Gait: Unassisted, Nonantalgic.       Assessment:   65 year old male left foot symptomatic bunion, right posterior heel pain     Plan:  -Treatment options discussed including all alternatives, risks, and complications -Etiology of symptoms were discussed -X-rays obtained reviewed.  Moderate bunions present on the left foot.  Minimal bone spur present the posterior aspect the right heel.  There is no evidence of acute fracture. -Regards to the left foot I think most of his symptoms today are coming from the bunion.  We discussed with conservative as well as surgical options for this.  He wants to consider surgical intervention.  Discussed with him also bunionectomy with screw fixation.  Likely benign given the second January.  However prior to this I like to get an ABI which was ordered.  We did discuss the surgery as well as postoperative  course.  For now continue with conservative treatment occluding shoe modifications, padding, offloading.  Voltaren gel as needed.  Also order blood work today. -For the right posterior heel pain we discussed stretching, icing daily.  Discussed shoe modifications and good arch support. -Mobic as needed and discussed side effects.    Trula Slade DPM

## 2021-08-13 ENCOUNTER — Other Ambulatory Visit: Payer: Self-pay

## 2021-08-13 ENCOUNTER — Ambulatory Visit (HOSPITAL_COMMUNITY)
Admission: RE | Admit: 2021-08-13 | Discharge: 2021-08-13 | Disposition: A | Discharge status: Home / Self Care | Payer: 59 | Source: Ambulatory Visit | Attending: Podiatry | Admitting: Podiatry

## 2021-08-13 DIAGNOSIS — I739 Peripheral vascular disease, unspecified: Secondary | ICD-10-CM | POA: Insufficient documentation

## 2021-08-14 ENCOUNTER — Telehealth: Payer: Self-pay | Admitting: *Deleted

## 2021-08-14 NOTE — Telephone Encounter (Signed)
-----   Message from Trula Slade, DPM sent at 08/14/2021  1:58 PM EST ----- Lattie Haw- please let him know that the circulation test was normal. Thanks.

## 2021-08-14 NOTE — Telephone Encounter (Signed)
Called and spoke with the patient and relayed the message per Dr Jacqualyn Posey. Lattie Haw

## 2021-09-28 ENCOUNTER — Other Ambulatory Visit: Payer: Self-pay

## 2021-09-28 ENCOUNTER — Ambulatory Visit (INDEPENDENT_AMBULATORY_CARE_PROVIDER_SITE_OTHER): Payer: 59 | Admitting: Podiatry

## 2021-09-28 DIAGNOSIS — M21612 Bunion of left foot: Secondary | ICD-10-CM

## 2021-09-28 DIAGNOSIS — G8929 Other chronic pain: Secondary | ICD-10-CM | POA: Diagnosis not present

## 2021-09-28 DIAGNOSIS — M79671 Pain in right foot: Secondary | ICD-10-CM

## 2021-09-28 DIAGNOSIS — M21619 Bunion of unspecified foot: Secondary | ICD-10-CM

## 2021-09-28 DIAGNOSIS — M7661 Achilles tendinitis, right leg: Secondary | ICD-10-CM

## 2021-09-28 NOTE — Patient Instructions (Signed)

## 2021-10-02 NOTE — Progress Notes (Signed)
Subjective: 66 year old male presents the office today for surgical consultation given pain with his left foot on the bunion.  He states he has tried shoe modification offloading padding that significant improvement this point was to proceed with surgical intervention.  This is been ongoing for quite some time has been getting worse.  Pain is intermittent depending on pressure as well as activity level.   The pain to the heel is doing better.  He stopped taking meloxicam.  His pain level has improved and no recent injury or changes otherwise.  He does not smoke. Arterial studies normal.  He has yet to get the blood work.    Objective: AAO x3, NAD DP/PT pulses palpable bilaterally, CRT less than 3 seconds Moderate bunion is present on the left foot-there is tenderness palpation on the medial aspect the first metatarsal head.  There is no crepitation or restriction with MPJ range of motion.  No immobility of the first ray.   There is no discomfort to palpation of the posterior aspect calcaneus.  Achilles tendon appears to be intact.  Anterior lateral compression.  No other areas of discomfort.  MMT 5/5. No pain with calf compression, swelling, warmth, erythema  Assessment: Symptomatic bunion left foot, posterior heel pain right side  Plan: -All treatment options discussed with the patient including all alternatives, risks, complications.  -Evidently heel pain continue stretching, icing as well as wearing shoes and good arch support. -For the left foot we discussed both conservative as well as surgical treatment options.  Tried conservative treatments and significant resolution of this point was proceed with surgical intervention. Will proceed with  Noreene Larsson bunionectomy.  -The incision placement as well as the postoperative course was discussed with the patient. I discussed risks of the surgery which include, but not limited to, infection, bleeding, pain, swelling, need for further surgery,  delayed or nonhealing, painful or ugly scar, numbness or sensation changes, over/under correction, recurrence, transfer lesions, further deformity, hardware failure, DVT/PE, loss of toe/foot. Patient understands these risks and wishes to proceed with surgery. The surgical consent was reviewed with the patient all 3 pages were signed. No promises or guarantees were given to the outcome of the procedure. All questions were answered to the best of my ability. Before the surgery the patient was encouraged to call the office if there is any further questions. The surgery will be performed at the [facility] on an outpatient basis. -CAM boot dispensed for post-op use.  -Pre-op blood work -Patient encouraged to call the office with any questions, concerns, change in symptoms.

## 2021-10-03 ENCOUNTER — Telehealth: Payer: Self-pay | Admitting: Urology

## 2021-10-03 NOTE — Telephone Encounter (Signed)
DOS - 10/17/21  [facility] [place] LEFT --- [phone]  [employer] EFFECTIVE DATE - 09/23/21  RECEIVED FAX FROM [employer] STATING THAT CPT CODE 12811 HAS BEEN APPROVED, AUTH # [id number], GOOD FROM 10/02/21 - 12/31/21.

## 2021-10-04 ENCOUNTER — Ambulatory Visit (INDEPENDENT_AMBULATORY_CARE_PROVIDER_SITE_OTHER): Payer: 59 | Admitting: Urology

## 2021-10-04 ENCOUNTER — Ambulatory Visit: Payer: 59 | Admitting: Urology

## 2021-10-04 ENCOUNTER — Other Ambulatory Visit: Payer: Self-pay

## 2021-10-04 ENCOUNTER — Encounter: Payer: Self-pay | Admitting: Urology

## 2021-10-04 VITALS — BP 169/94 | HR 67 | Ht 69.0 in | Wt 229.0 lb

## 2021-10-04 DIAGNOSIS — N35919 Unspecified urethral stricture, male, unspecified site: Secondary | ICD-10-CM

## 2021-10-04 DIAGNOSIS — N3000 Acute cystitis without hematuria: Secondary | ICD-10-CM

## 2021-10-04 LAB — BLADDER SCAN AMB NON-IMAGING

## 2021-10-04 LAB — URINALYSIS, ROUTINE W REFLEX MICROSCOPIC
Bilirubin, UA: NEGATIVE
Glucose, UA: NEGATIVE
Ketones, UA: NEGATIVE
Nitrite, UA: POSITIVE — AB
Protein,UA: NEGATIVE
Specific Gravity, UA: 1.02 (ref 1.005–1.030)
Urobilinogen, Ur: 0.2 mg/dL (ref 0.2–1.0)
pH, UA: 5.5 (ref 5.0–7.5)

## 2021-10-04 LAB — MICROSCOPIC EXAMINATION
Renal Epithel, UA: NONE SEEN /hpf
WBC, UA: >30 /hpf — AB (ref 0–5)

## 2021-10-04 MED ORDER — SULFAMETHOXAZOLE-TRIMETHOPRIM 800-160 MG PO TABS: 1.0000 | ORAL_TABLET | Freq: Two times a day (BID) | ORAL | 0 refills | Status: DC

## 2021-10-04 NOTE — Progress Notes (Signed)
post void residual= 234ml

## 2021-10-04 NOTE — Progress Notes (Signed)
Assessment: 1. Acute cystitis without hematuria   2. Stricture of male urethra, unspecified stricture type     Plan: Pt presentation discussed with Dr. Jeffie Pollock who also evaluates the patient today.  Medical records reviewed from [facility].  Bactrim DS twice daily for 7 days prescribed and will adjust therapy pending culture results as ordered today.  Patient is given fresh catheters for home use and will follow-up in approximately 3 months for PVR and further evaluation.  Chief Complaint: "I think I'm infected"  History of Present Illness:  FUTURE YELDELL is a 66 y.o. year old male with h/o urethral stricture and frequent UTIs who is seen in consultation from Mancel Bale, PA-C to establish care due to insurance change. The pt for evaluation of 2 week h/o urinary burning, urgency, and fatigue with cloudy, malodorous urine which usually indicates to him that he has a UTI.  He denies fever, chills, nausea or vomiting.  No abdominal pain and no back pain.  No gross hematuria. The patient reports voiding without difficulty and self caths approximately twice weekly in order to maintain patency of urethral opening. Medical records from [facility] urology indicate the patient is to self cath daily with a coud catheter in order to prevent stricture reformation.  He reports that he uses a 95F straight catheter and stopped using the coude because of discomfort.    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 He has been self cathing since as noted above.  Hx also significant for recurrent UTIs, culture positive with resistance noted to quinolones.  His last documented UTI was May 22. The patient is also noted to be on sildenafil for erectile dysfunction.   Past Medical History:  Past Medical History:  Diagnosis Date   Epididymitis    GERD (gastroesophageal reflux disease)     Gout of big toe 05/21/2013   Hard of hearing    History of gout    Hx of adenomatous polyp of colon 02/01/2017   Hyperlipidemia    Hypertension    Orchitis    Sepsis ([place]) hospitalized 12/25/2015   Urethral stricture     Past Surgical History:  Past Surgical History:  Procedure Laterality Date   COLONOSCOPY     CYSTOSCOPY WITH DIRECT VISION INTERNAL URETHROTOMY N/A 01/25/2016   Procedure:  DIRECT VISION INTERNAL URETHROTOMY;  Surgeon: Ardis Hughs, MD;  Location: [facility];  Service: Urology;  Laterality: N/A;   CYSTOSCOPY WITH RETROGRADE URETHROGRAM N/A 01/25/2016   Procedure:  RETROGRADE URETHROGRAM;  Surgeon: Ardis Hughs, MD;  Location: [facility];  Service: Urology;  Laterality: N/A;   INGUINAL HERNIA REPAIR Right    POLYPECTOMY     UPPER GASTROINTESTINAL ENDOSCOPY     WISDOM TOOTH EXTRACTION      Allergies:  Allergies  Allergen Reactions   Atorvastatin Other (See Comments)    myalgia   Lisinopril Cough    Family History:  Family History  Problem Relation Age of Onset   Diabetes Mellitus II Paternal Grandfather    Throat cancer Sister    Other Brother        Muscle cramps   Colon cancer Neg Hx    Esophageal cancer Neg Hx    Pancreatic cancer Neg Hx    Prostate cancer Neg Hx    Rectal cancer Neg Hx    Stomach cancer Neg Hx  Colon polyps Neg Hx     Social History:  Social History   Tobacco Use   Smoking status: Never   Smokeless tobacco: Never  Vaping Use   Vaping Use: Never used  Substance Use Topics   Alcohol use: Yes    Alcohol/week: 6.0 standard drinks    Types: 6 Glasses of wine per week   Drug use: No    Review of symptoms:  Constitutional:  Negative for unexplained weight loss, night sweats, fever, chills ENT:  Negative for nose bleeds, sinus pain, painful swallowing CV:  Negative for chest pain, shortness of breath, exercise intolerance, palpitations, loss of consciousness Resp:  Negative for cough, wheezing, shortness of breath GI:   Negative for nausea, vomiting, diarrhea, bloody stools GU:  Positives noted in HPI; otherwise negative for gross hematuria, urinary incontinence Neuro:  Negative for seizures, poor balance, limb weakness, slurred speech Psych:  Negative for lack of energy, depression, anxiety Endocrine:  Negative for polydipsia, polyuria, symptoms of hypoglycemia (dizziness, hunger, sweating) Hematologic:  Negative for anemia, purpura, petechia, prolonged or excessive bleeding, use of anticoagulants  Allergic:  Negative for difficulty breathing or choking as a result of exposure to anything; no shellfish allergy; no allergic response (rash/itch) to materials, foods  Physical exam: BP (!) 169/94    Pulse 67    Ht 5\' 9"  (1.753 m)    Wt 229 lb (103.9 kg)    BMI 33.82 kg/m  GENERAL APPEARANCE:  Well appearing, well developed, well nourished, NAD HEENT: Atraumatic, Normocephalic, oropharynx clear. NECK: Supple Trachea midline LUNGS: Clear to auscultation bilaterally. HEART: Regular Rate and Rhythm without murmurs, gallops, or rubs. ABDOMEN: Soft, non-tender, No Masses.  No distention. EXTREMITIES: Moves all extremities well.  Without clubbing, cyanosis, or edema. NEUROLOGIC:  Alert and oriented x 3, normal gait, CN II-XII grossly intact.  MENTAL STATUS:  Appropriate. BACK:  Non-tender to palpation.  No CVAT SKIN:  Warm, dry and intact.    Results: Results for orders placed or performed in visit on 10/04/21 (from the past 24 hour(s))  BLADDER SCAN AMB NON-IMAGING   Collection Time: 10/04/21 12:00 PM  Result Value Ref Range   Scan Result 228ml   Microscopic Examination   Collection Time: 10/04/21  4:55 PM   Urine  Result Value Ref Range   WBC, UA >30 (A) 0 - 5 /hpf   RBC 0-2 0 - 2 /hpf   Epithelial Cells (non renal) 0-10 0 - 10 /hpf   Renal Epithel, UA None seen None seen /hpf   Bacteria, UA Many (A) None seen/Few  Urinalysis, Routine w reflex microscopic   Collection Time: 10/04/21  4:55 PM  Result  Value Ref Range   Specific Gravity, UA 1.020 1.005 - 1.030   pH, UA 5.5 5.0 - 7.5   Color, UA Amber (A) Yellow   Appearance Ur Cloudy (A) Clear   Leukocytes,UA 1+ (A) Negative   Protein,UA Negative Negative/Trace   Glucose, UA Negative Negative   Ketones, UA Negative Negative   RBC, UA Trace (A) Negative   Bilirubin, UA Negative Negative   Urobilinogen, Ur 0.2 0.2 - 1.0 mg/dL   Nitrite, UA Positive (A) Negative   Microscopic Examination See below:

## 2021-10-04 NOTE — Progress Notes (Signed)
Urological Symptom Review  Patient is experiencing the following symptoms: Urinary tract infection   Review of Systems  Gastrointestinal (upper)  : Negative for upper GI symptoms  Gastrointestinal (lower) : Negative for lower GI symptoms  Constitutional : Fatigue  Skin: Negative for skin symptoms  Eyes: Negative for eye symptoms  Ear/Nose/Throat : Negative for Ear/Nose/Throat symptoms  Hematologic/Lymphatic: Negative for Hematologic/Lymphatic symptoms  Cardiovascular : Negative for cardiovascular symptoms  Respiratory : Negative for respiratory symptoms  Endocrine: Negative for endocrine symptoms  Musculoskeletal: Negative for musculoskeletal symptoms  Neurological: Negative for neurological symptoms  Psychologic: Negative for psychiatric symptoms

## 2021-10-07 LAB — URINE CULTURE

## 2021-10-08 ENCOUNTER — Telehealth: Payer: Self-pay

## 2021-10-08 NOTE — Telephone Encounter (Signed)
Patient called with no answer. Detailed message left. °

## 2021-10-08 NOTE — Telephone Encounter (Signed)
-----   Message from [facility], [place] sent at 10/08/2021  8:25 AM EST ----- Urine cx shows sensitivity to Bactrim. Pt needs to complete full 7 days. Also recommend that he use the coude catheters as previously instructed by Dr. Jeffie Pollock in order to keep his urethral stricture open as well as possible. ----- Message ----- From: Interface, [facility] Lab Results In Sent: 10/07/2021   3:35 PM EST To: Reynaldo Minium, PA-C

## 2021-10-17 ENCOUNTER — Other Ambulatory Visit: Payer: Self-pay | Admitting: Podiatry

## 2021-10-17 ENCOUNTER — Encounter: Payer: Self-pay | Admitting: Podiatry

## 2021-10-17 DIAGNOSIS — M2012 Hallux valgus (acquired), left foot: Secondary | ICD-10-CM | POA: Diagnosis not present

## 2021-10-17 DIAGNOSIS — L02612 Cutaneous abscess of left foot: Secondary | ICD-10-CM | POA: Diagnosis not present

## 2021-10-17 MED ORDER — CEPHALEXIN 500 MG PO CAPS: 500.0000 mg | ORAL_CAPSULE | Freq: Three times a day (TID) | ORAL | 0 refills | Status: DC

## 2021-10-17 MED ORDER — HYDROCODONE-ACETAMINOPHEN 5-325 MG PO TABS: 1.0000 | ORAL_TABLET | Freq: Four times a day (QID) | ORAL | 0 refills | Status: DC | PRN

## 2021-10-17 MED ORDER — COLCHICINE 0.6 MG PO TABS: 0.6000 mg | ORAL_TABLET | Freq: Every day | ORAL | 0 refills | Status: DC

## 2021-10-17 MED ORDER — PROMETHAZINE HCL 25 MG PO TABS: 25.0000 mg | ORAL_TABLET | Freq: Three times a day (TID) | ORAL | 0 refills | Status: DC | PRN

## 2021-10-17 NOTE — Progress Notes (Signed)
Post op medications sent to pharmacy 

## 2021-10-22 ENCOUNTER — Ambulatory Visit (INDEPENDENT_AMBULATORY_CARE_PROVIDER_SITE_OTHER): Payer: 59 | Admitting: Podiatry

## 2021-10-22 ENCOUNTER — Other Ambulatory Visit: Payer: Self-pay

## 2021-10-22 ENCOUNTER — Ambulatory Visit (INDEPENDENT_AMBULATORY_CARE_PROVIDER_SITE_OTHER): Payer: 59

## 2021-10-22 DIAGNOSIS — Z9889 Other specified postprocedural states: Secondary | ICD-10-CM | POA: Diagnosis not present

## 2021-10-22 DIAGNOSIS — M21619 Bunion of unspecified foot: Secondary | ICD-10-CM

## 2021-10-22 DIAGNOSIS — M1000 Idiopathic gout, unspecified site: Secondary | ICD-10-CM

## 2021-10-22 DIAGNOSIS — M7752 Other enthesopathy of left foot: Secondary | ICD-10-CM

## 2021-10-22 LAB — CBC WITH DIFFERENTIAL/PLATELET
Absolute Monocytes: 378 cells/uL (ref 200–950)
Basophils Absolute: 22 cells/uL (ref 0–200)
Basophils Relative: 0.8 %
Eosinophils Absolute: 113 cells/uL (ref 15–500)
Eosinophils Relative: 4.2 %
HCT: 44.7 % (ref 38.5–50.0)
Hemoglobin: 15.1 g/dL (ref 13.2–17.1)
Lymphs Abs: 1131 cells/uL (ref 850–3900)
MCH: 28.7 pg (ref 27.0–33.0)
MCHC: 33.8 g/dL (ref 32.0–36.0)
MCV: 85 fL (ref 80.0–100.0)
MPV: 11.5 fL (ref 7.5–12.5)
Monocytes Relative: 14 %
Neutro Abs: 1056 cells/uL — ABNORMAL LOW (ref 1500–7800)
Neutrophils Relative %: 39.1 %
Platelets: 161 10*3/uL (ref 140–400)
RBC: 5.26 10*6/uL (ref 4.20–5.80)
RDW: 14.4 % (ref 11.0–15.0)
Total Lymphocyte: 41.9 %
WBC: 2.7 10*3/uL — ABNORMAL LOW (ref 3.8–10.8)

## 2021-10-22 LAB — BASIC METABOLIC PANEL
BUN: 19 mg/dL (ref 7–25)
CO2: 26 mmol/L (ref 20–32)
Calcium: 9.5 mg/dL (ref 8.6–10.3)
Chloride: 104 mmol/L (ref 98–110)
Creat: 1.26 mg/dL (ref 0.70–1.35)
Glucose, Bld: 91 mg/dL (ref 65–99)
Potassium: 4.4 mmol/L (ref 3.5–5.3)
Sodium: 137 mmol/L (ref 135–146)

## 2021-10-22 LAB — URIC ACID: Uric Acid, Serum: 8.1 mg/dL — ABNORMAL HIGH (ref 4.0–8.0)

## 2021-10-22 NOTE — Progress Notes (Signed)
Subjective: [name] is a 66 y.o. is seen today in office s/p left foot first MPJ arthrotomy, biopsy, wound culture preformed on 10/17/2021.  Overall states he been doing well.  His pain is controlled.  Still in the cam boot but asking for smaller shoe.  Denies any systemic complaints such as fevers, chills, nausea, vomiting. No calf pain, chest pain, shortness of breath.   Patient was originally scheduled for bunion surgery however during the surgery there was found to be significant inflammatory fluid noted along the joint, arthritic changes present of the first MPJ due to gout.  Due to this this procedure was changed to an arthrotomy, cultures throughout any infection before proceeding with surgery.  Also likely need to have her first MTPJ arthrodesis.  Patient states today that on Monday prior to surgery he did have some swelling to his foot.  The day of surgery there is mild swelling but there is no open sores or any significant erythema.  Objective: General: No acute distress, AAOx3  DP/PT pulses palpable 2/4, CRT < 3 sec to all digits.  Protective sensation intact. Motor function intact.  Left foot: Incision is well coapted without any evidence of dehiscence.  There is minimal edema.  There is no erythema or increased warmth.  There is no drainage or pus any obvious signs of infection noted today.   No other areas of tenderness to bilateral lower extremities.  No other open lesions or pre-ulcerative lesions.  No pain with calf compression, swelling, warmth, erythema.   Assessment and Plan:  Status post left foot surgery, doing well with no complications   -Treatment options discussed including all alternatives, risks, and complications -X-rays obtained reviewed.  No subacute fracture or osteomyelitis. -Incisions clean.  Small amount of antibiotic ointment was applied followed by dressing.  He continues to manage every couple days with a similar bandage.  Do not soak the  foot. -Ice/elevation -Pain medication as needed. -Blood work -Monitor for any clinical signs or symptoms of infection and DVT/PE and directed to call the office immediately should any occur or go to the ER. -Follow-up as scheduled for possible suture removal or sooner if any problems arise. In the meantime, encouraged to call the office with any questions, concerns, change in symptoms.   *Awaiting culture, pathology  Celesta Gentile, DPM

## 2021-10-31 ENCOUNTER — Telehealth: Payer: Self-pay | Admitting: Urology

## 2021-10-31 NOTE — Telephone Encounter (Signed)
Inbound call from the patient requesting a rx/order for cath supplies to be sent to [facility] on [address], please.  Previously his supplies was through [facility], however they no longer accept his insurance.  The pt can be reached @ [phone] for any further questions or concerns.  Thank you

## 2021-11-01 ENCOUNTER — Other Ambulatory Visit: Payer: Self-pay

## 2021-11-01 ENCOUNTER — Other Ambulatory Visit: Payer: Self-pay | Admitting: Podiatry

## 2021-11-01 ENCOUNTER — Ambulatory Visit (INDEPENDENT_AMBULATORY_CARE_PROVIDER_SITE_OTHER): Payer: 59 | Admitting: Podiatry

## 2021-11-01 DIAGNOSIS — Z7689 Persons encountering health services in other specified circumstances: Secondary | ICD-10-CM

## 2021-11-01 DIAGNOSIS — M1000 Idiopathic gout, unspecified site: Secondary | ICD-10-CM

## 2021-11-01 DIAGNOSIS — M7752 Other enthesopathy of left foot: Secondary | ICD-10-CM

## 2021-11-01 DIAGNOSIS — D709 Neutropenia, unspecified: Secondary | ICD-10-CM

## 2021-11-01 MED ORDER — ALLOPURINOL 100 MG PO TABS: 100.0000 mg | ORAL_TABLET | Freq: Every day | ORAL | 0 refills | Status: DC

## 2021-11-01 NOTE — Telephone Encounter (Signed)
Notified [place]  concerning catheter supplies for patient. [place] states patient will have to pay out of pocket for supplies.  Patient called and made aware and states  he will call [facility] [place] to see how much supplies will be and call office back.

## 2021-11-01 NOTE — Telephone Encounter (Signed)
Please advise 

## 2021-11-05 NOTE — Progress Notes (Signed)
Subjective: [name] is a 66 y.o. is seen today in office s/p left foot first MPJ arthrotomy, biopsy, wound culture preformed on 10/17/2021.  Presents today for suture removal.  States he has been doing well but having some discomfort still.  Denies any fevers or chills.  Does not wear the surgical shoe all the time.  He has no new concerns.  No recent injuries.   Patient was originally scheduled for bunion surgery however during the surgery there was found to be significant inflammatory fluid noted along the joint, arthritic changes present of the first MPJ due to gout.  Due to this this procedure was changed to an arthrotomy, cultures throughout any infection before proceeding with surgery.  Also likely need to have her first MTPJ arthrodesis.  Patient states today that on Monday prior to surgery he did have some swelling to his foot.  The day of surgery there is mild swelling but there is no open sores or any significant erythema.  Objective: General: No acute distress, AAOx3  DP/PT pulses palpable 2/4, CRT < 3 sec to all digits.  Protective sensation intact. Motor function intact.  Left foot: Incision is well coapted without any evidence of dehiscence.  There is minimal edema.  There is no erythema or increased warmth.  There is no drainage or pus any obvious signs of infection noted today.  Incision appears to be healing well for this time postoperatively.  There is no significant erythema or warmth or any clinical signs of gout today. No other areas of tenderness to bilateral lower extremities.  No other open lesions or pre-ulcerative lesions.  No pain with calf compression, swelling, warmth, erythema.   Assessment and Plan:  Status post left foot surgery, doing well with no complications   -Treatment options discussed including all alternatives, risks, and complications -Sutures removed today without complications.  Incisions well coapted.  Discussed with him he can wash with soap and  water daily and apply a small amount of antibiotic ointment and a bandage. -Continue with surgical shoe for now. -Ice/elevation -Pain medication as needed. -Blood work reviewed.  White blood cell count decreased to 2.7 neutrophils 1056.  Uric acid level 8.1 -Reviewed culture results which were no growth surgery -Pathology revealed bone, cartilage and refractile deposition with reactive synovium, no evidence of malignancy. -Recommend follow-up with primary care.  He states that he inserts his change he does not have a primary care provider.  Referral was placed for [facility]. -I recommended surgical treatment elevated sed rate transition to regular shoe as incision heals and swelling improves. -Monitor for any clinical signs or symptoms of infection and directed to call the office immediately should any occur or go to the ER  Return in about 3 weeks (around 11/22/2021).  Trula Slade DPM

## 2021-11-12 ENCOUNTER — Other Ambulatory Visit: Payer: Self-pay | Admitting: Podiatry

## 2021-11-12 ENCOUNTER — Telehealth: Payer: Self-pay | Admitting: *Deleted

## 2021-11-12 MED ORDER — HYDROCODONE-ACETAMINOPHEN 5-325 MG PO TABS: 1.0000 | ORAL_TABLET | Freq: Four times a day (QID) | ORAL | 0 refills | Status: DC | PRN

## 2021-11-12 MED ORDER — CEPHALEXIN 500 MG PO CAPS: 500.0000 mg | ORAL_CAPSULE | Freq: Three times a day (TID) | ORAL | 0 refills | Status: DC

## 2021-11-12 NOTE — Telephone Encounter (Signed)
Patient is calling because his left great post surgery toe is in excruciating pain , unable to sleep at night, is taking ibuprofen-800 ,1 tablet once daily, no draining, little swelling and redness noticed. He has an upcoming appointment 11/22/21. Please advise.

## 2021-11-13 NOTE — Telephone Encounter (Signed)
Returned the call back to patient informing that pain medicine and antibiotic has been sent to pharmacy, he verbalized understanding and said that he does not believe that the pain is coming from a gout flare up, is a sharp pain coming from the incision site,does not hurt with moving but mostly at night ,no swelling and wakes him up during the night

## 2021-11-15 ENCOUNTER — Encounter: Payer: 59 | Admitting: Podiatry

## 2021-11-22 ENCOUNTER — Ambulatory Visit (INDEPENDENT_AMBULATORY_CARE_PROVIDER_SITE_OTHER): Payer: 59 | Admitting: Podiatry

## 2021-11-22 ENCOUNTER — Other Ambulatory Visit: Payer: Self-pay

## 2021-11-22 ENCOUNTER — Ambulatory Visit (INDEPENDENT_AMBULATORY_CARE_PROVIDER_SITE_OTHER): Payer: 59

## 2021-11-22 DIAGNOSIS — Z9889 Other specified postprocedural states: Secondary | ICD-10-CM

## 2021-11-22 DIAGNOSIS — M1000 Idiopathic gout, unspecified site: Secondary | ICD-10-CM

## 2021-11-22 DIAGNOSIS — M21612 Bunion of left foot: Secondary | ICD-10-CM

## 2021-11-22 MED ORDER — MELOXICAM 15 MG PO TABS: 15.0000 mg | ORAL_TABLET | Freq: Every day | ORAL | 0 refills | Status: DC | PRN

## 2021-11-22 MED ORDER — HYDROCODONE-ACETAMINOPHEN 5-325 MG PO TABS: 1.0000 | ORAL_TABLET | Freq: Four times a day (QID) | ORAL | 0 refills | Status: DC | PRN

## 2021-11-26 NOTE — Progress Notes (Signed)
Subjective: ?[name] is a 66 y.o. is seen today in office s/p left foot first MPJ arthrotomy, biopsy, wound culture preformed on 10/17/2021.  States he is still having discomfort he describes a sharp pain intermittently.  He still wearing a surgical shoe.  Still gets some swelling.  No fevers or chills.  No recent injury or change otherwise.  No new concerns otherwise. ? ? ?Patient was originally scheduled for bunion surgery however during the surgery there was found to be significant inflammatory fluid noted along the joint, arthritic changes present of the first MPJ due to gout.  Due to this this procedure was changed to an arthrotomy, cultures throughout any infection before proceeding with surgery.  Also likely need to have her first MTPJ arthrodesis.  Patient states today that on Monday prior to surgery he did have some swelling to his foot.  The day of surgery there is mild swelling but there is no open sores or any significant erythema. ? ?Objective: ?General: No acute distress, AAOx3  ?DP/PT pulses palpable 2/4, CRT < 3 sec to all digits.  ?Protective sensation intact. Motor function intact.  ?Left foot: Incision is well coapted without any evidence of dehiscence and scar is formed.  There is still some edema present first MPJ.  The majority tenderness is directly along the medial bunion as well as on the sesamoid complex plantarly.  Mostly on the medial sesamoid.  MPJ range of motion today.  There is no other areas of pinpoint tenderness.  There is no significant erythema or warmth.  ?No other open lesions or pre-ulcerative lesions.  ?No pain with calf compression, swelling, warmth, erythema.  ? ?Assessment and Plan:  ?Status post left foot surgery ? ?-Treatment options discussed including all alternatives, risks, and complications ?-X-rays obtained and reviewed with the patient today.  Bunions present.  No evidence of acute fracture. ?-I think her symptoms are multifactorial.  There is still some  residual swelling from the surgery as well as inflammation still likely from gout as well as he has the underlying bunion.  I prescribed meloxicam to see if this will help with inflammation.  Offloading pads of the bunion discussed transition to regular shoe as tolerated.  Ultimately will likely need to have the first MPJ arthrodesis. ?-Continue allopurinol for gout. ? ?Trula Slade DPM ? ?

## 2021-12-06 ENCOUNTER — Other Ambulatory Visit: Payer: Self-pay

## 2021-12-06 ENCOUNTER — Ambulatory Visit (INDEPENDENT_AMBULATORY_CARE_PROVIDER_SITE_OTHER): Payer: 59 | Admitting: Podiatry

## 2021-12-06 DIAGNOSIS — M21612 Bunion of left foot: Secondary | ICD-10-CM

## 2021-12-06 DIAGNOSIS — Z9889 Other specified postprocedural states: Secondary | ICD-10-CM

## 2021-12-06 MED ORDER — ALLOPURINOL 100 MG PO TABS: ORAL_TABLET | ORAL | 0 refills | Status: DC

## 2021-12-11 NOTE — Progress Notes (Signed)
Subjective: ?[name] is a 66 y.o. is seen today in office s/p left foot first MPJ arthrotomy, biopsy, wound culture preformed on 10/17/2021.  States he is feeling much better is recommended regular shoe.  He is in slight discomfort but overall no significant pain at this time.  Swelling is also improved but still some residual swelling he reports.  No fevers or chills.  No nausea or vomiting.  No other concerns. ? ?Patient was originally scheduled for bunion surgery however during the surgery there was found to be significant inflammatory fluid noted along the joint, arthritic changes present of the first MPJ due to gout.  Due to this this procedure was changed to an arthrotomy, cultures throughout any infection before proceeding with surgery.  Also likely need to have her first MTPJ arthrodesis.  Patient states today that on Monday prior to surgery he did have some swelling to his foot.  The day of surgery there is mild swelling but there is no open sores or any significant erythema. ? ?Objective: ?General: No acute distress, AAOx3  ?DP/PT pulses palpable 2/4, CRT < 3 sec to all digits.  ?Protective sensation intact. Motor function intact.  ?Left foot: Incision is well coapted without any evidence of dehiscence and scar is formed.  There is mild edema still present but there is no erythema or warmth.  There is no significant tenderness palpation at surgical site.  Bunions present still.  No other areas of discomfort. ?No other open lesions or pre-ulcerative lesions.  ?No pain with calf compression, swelling, warmth, erythema.  ? ?Assessment and Plan:  ?Status post left foot surgery ? ?-Treatment options discussed including all alternatives, risks, and complications ?-Overall symptoms much improved he is back to wearing regular shoe.  Discussed with him that residual swelling will hopefully improve from having the surgery but also with having reoccurrence of gout. ?-Discussed with the surgery and he likely  need to have the first MPJ arthrodesis but after discussion he is not interested in having this done.  At this point he states that he will continue to monitor gout and should there be any reoccurrence or any worsening pain to let me know.  At this point with discharge from the postoperative course as he is doing well and he agrees with this plan but I encouraged to call back with any questions or concerns or any changes. ? ?Trula Slade DPM ?

## 2021-12-21 ENCOUNTER — Other Ambulatory Visit: Payer: Self-pay | Admitting: Podiatry

## 2021-12-21 DIAGNOSIS — Z9889 Other specified postprocedural states: Secondary | ICD-10-CM

## 2022-01-07 ENCOUNTER — Ambulatory Visit: Payer: 59 | Admitting: Podiatry

## 2022-01-10 ENCOUNTER — Ambulatory Visit: Payer: 59 | Admitting: Podiatry

## 2022-05-18 ENCOUNTER — Other Ambulatory Visit: Payer: Self-pay | Admitting: Podiatry

## 2022-05-20 ENCOUNTER — Other Ambulatory Visit: Payer: Self-pay | Admitting: Podiatry

## 2022-05-20 DIAGNOSIS — M1000 Idiopathic gout, unspecified site: Secondary | ICD-10-CM

## 2022-06-15 ENCOUNTER — Emergency Department (HOSPITAL_COMMUNITY): Payer: Medicare Other

## 2022-06-15 ENCOUNTER — Other Ambulatory Visit: Payer: Self-pay

## 2022-06-15 ENCOUNTER — Observation Stay (HOSPITAL_COMMUNITY)
Admission: EM | Admit: 2022-06-15 | Discharge: 2022-06-16 | Disposition: A | Discharge status: Home / Self Care | Payer: Medicare Other | Attending: Internal Medicine | Admitting: Internal Medicine

## 2022-06-15 ENCOUNTER — Encounter (HOSPITAL_COMMUNITY): Payer: Self-pay

## 2022-06-15 DIAGNOSIS — G459 Transient cerebral ischemic attack, unspecified: Secondary | ICD-10-CM | POA: Diagnosis not present

## 2022-06-15 DIAGNOSIS — E785 Hyperlipidemia, unspecified: Secondary | ICD-10-CM | POA: Diagnosis not present

## 2022-06-15 DIAGNOSIS — K219 Gastro-esophageal reflux disease without esophagitis: Secondary | ICD-10-CM | POA: Diagnosis not present

## 2022-06-15 DIAGNOSIS — I10 Essential (primary) hypertension: Secondary | ICD-10-CM | POA: Diagnosis not present

## 2022-06-15 DIAGNOSIS — E669 Obesity, unspecified: Secondary | ICD-10-CM | POA: Insufficient documentation

## 2022-06-15 DIAGNOSIS — R748 Abnormal levels of other serum enzymes: Secondary | ICD-10-CM | POA: Diagnosis not present

## 2022-06-15 DIAGNOSIS — R200 Anesthesia of skin: Secondary | ICD-10-CM | POA: Diagnosis present

## 2022-06-15 DIAGNOSIS — Z6833 Body mass index (BMI) 33.0-33.9, adult: Secondary | ICD-10-CM | POA: Insufficient documentation

## 2022-06-15 DIAGNOSIS — M791 Myalgia, unspecified site: Secondary | ICD-10-CM

## 2022-06-15 DIAGNOSIS — R7401 Elevation of levels of liver transaminase levels: Secondary | ICD-10-CM | POA: Diagnosis not present

## 2022-06-15 DIAGNOSIS — Z79899 Other long term (current) drug therapy: Secondary | ICD-10-CM | POA: Diagnosis not present

## 2022-06-15 DIAGNOSIS — R202 Paresthesia of skin: Secondary | ICD-10-CM

## 2022-06-15 LAB — CBC WITH DIFFERENTIAL/PLATELET
Abs Immature Granulocytes: 0 10*3/uL (ref 0.00–0.07)
Basophils Absolute: 0 10*3/uL (ref 0.0–0.1)
Basophils Relative: 1 %
Eosinophils Absolute: 0.1 10*3/uL (ref 0.0–0.5)
Eosinophils Relative: 3 %
HCT: 45 % (ref 39.0–52.0)
Hemoglobin: 15.1 g/dL (ref 13.0–17.0)
Immature Granulocytes: 0 %
Lymphocytes Relative: 35 %
Lymphs Abs: 1.3 10*3/uL (ref 0.7–4.0)
MCH: 28.8 pg (ref 26.0–34.0)
MCHC: 33.6 g/dL (ref 30.0–36.0)
MCV: 85.9 fL (ref 80.0–100.0)
Monocytes Absolute: 0.6 10*3/uL (ref 0.1–1.0)
Monocytes Relative: 15 %
Neutro Abs: 1.7 10*3/uL (ref 1.7–7.7)
Neutrophils Relative %: 46 %
Platelets: 141 10*3/uL — ABNORMAL LOW (ref 150–400)
RBC: 5.24 MIL/uL (ref 4.22–5.81)
RDW: 15.1 % (ref 11.5–15.5)
WBC: 3.8 10*3/uL — ABNORMAL LOW (ref 4.0–10.5)
nRBC: 0 % (ref 0.0–0.2)

## 2022-06-15 LAB — COMPREHENSIVE METABOLIC PANEL
ALT: 70 U/L — ABNORMAL HIGH (ref 0–44)
AST: 58 U/L — ABNORMAL HIGH (ref 15–41)
Albumin: 3.9 g/dL (ref 3.5–5.0)
Alkaline Phosphatase: 44 U/L (ref 38–126)
Anion gap: 7 (ref 5–15)
BUN: 17 mg/dL (ref 8–23)
CO2: 21 mmol/L — ABNORMAL LOW (ref 22–32)
Calcium: 8.7 mg/dL — ABNORMAL LOW (ref 8.9–10.3)
Chloride: 108 mmol/L (ref 98–111)
Creatinine, Ser: 1.35 mg/dL — ABNORMAL HIGH (ref 0.61–1.24)
GFR, Estimated: 58 mL/min — ABNORMAL LOW (ref >60)
Glucose, Bld: 100 mg/dL — ABNORMAL HIGH (ref 70–99)
Potassium: 4.1 mmol/L (ref 3.5–5.1)
Sodium: 136 mmol/L (ref 135–145)
Total Bilirubin: 0.7 mg/dL (ref 0.3–1.2)
Total Protein: 8 g/dL (ref 6.5–8.1)

## 2022-06-15 LAB — CK: Total CK: 930 U/L — ABNORMAL HIGH (ref 49–397)

## 2022-06-15 LAB — MAGNESIUM: Magnesium: 2.1 mg/dL (ref 1.7–2.4)

## 2022-06-15 MED ORDER — CLOPIDOGREL BISULFATE 75 MG PO TABS
75.0000 mg | ORAL_TABLET | Freq: Every day | ORAL | Status: DC
  Administered 2022-06-16: 75 mg via ORAL
  Filled 2022-06-15: qty 1

## 2022-06-15 MED ORDER — SODIUM CHLORIDE 0.9 % IV BOLUS
1000.0000 mL | Freq: Once | INTRAVENOUS | Status: AC
  Administered 2022-06-15: 1000 mL via INTRAVENOUS

## 2022-06-15 MED ORDER — IOHEXOL 350 MG/ML SOLN
75.0000 mL | Freq: Once | INTRAVENOUS | Status: AC | PRN
  Administered 2022-06-15: 75 mL via INTRAVENOUS

## 2022-06-15 MED ORDER — ASPIRIN 81 MG PO CHEW
81.0000 mg | CHEWABLE_TABLET | Freq: Every day | ORAL | Status: DC
  Administered 2022-06-16: 81 mg via ORAL
  Filled 2022-06-15: qty 1

## 2022-06-15 MED ORDER — ACETAMINOPHEN 325 MG PO TABS
650.0000 mg | ORAL_TABLET | Freq: Four times a day (QID) | ORAL | Status: DC | PRN
  Administered 2022-06-16: 650 mg via ORAL
  Filled 2022-06-15: qty 2

## 2022-06-15 MED ORDER — CLOPIDOGREL BISULFATE 300 MG PO TABS
300.0000 mg | ORAL_TABLET | Freq: Once | ORAL | Status: AC
  Administered 2022-06-15: 300 mg via ORAL
  Filled 2022-06-15: qty 1

## 2022-06-15 MED ORDER — ACETAMINOPHEN 650 MG RE SUPP: 650.0000 mg | Freq: Four times a day (QID) | RECTAL | Status: DC | PRN

## 2022-06-15 MED ORDER — STROKE: EARLY STAGES OF RECOVERY BOOK
Freq: Once | Status: AC
  Administered 2022-06-16: 1
  Filled 2022-06-15: qty 1

## 2022-06-15 MED ORDER — ASPIRIN 325 MG PO TABS
650.0000 mg | ORAL_TABLET | Freq: Once | ORAL | Status: AC
  Administered 2022-06-15: 650 mg via ORAL
  Filled 2022-06-15: qty 2

## 2022-06-15 NOTE — ED Provider Notes (Signed)
[facility] DEPT Provider Note   CSN: [id number] Arrival date & time: 06/15/22  1709     History  Chief Complaint  Patient presents with   Numbness    [name] is a 66 y.o. male.  Patient is a 66 year old male with a past medical history of hypertension, hyperlipidemia and gout presenting to the emergency department with muscle cramps and numbness on the left side of his body.  He states that he has been dealing with muscle cramps on and off for years.  He states that over the last day he has had increasing cramping mostly in his bilateral thighs.  He states that he drinks a lot of water and electrolyte drinks.  He denies any recent changes in activity states that he is no longer taking a statin.  He denies any trauma or falls.  He states that over the last month he has had intermittent numbness on the left side of his body including his left lip, left arm and left leg.  He states that will last for a few minutes at a time and resolve on its own.  He states he occasionally has associated weakness.  He states that it will happen anywhere from a few times a week to a few times a day.  He denies any vision changes.  He denies any headaches.  The history is provided by the patient.       Home Medications Prior to Admission medications   Medication Sig Start Date End Date Taking? Authorizing Provider  allopurinol (ZYLOPRIM) 100 MG tablet TAKE 1 TABLET(100 MG) BY MOUTH DAILY 05/20/22   Trula Slade, DPM  amLODipine (NORVASC) 5 MG tablet TAKE 1 TABLET(5 MG) BY MOUTH DAILY 06/03/20   Burchette, Alinda Sierras, MD  atorvastatin (LIPITOR) 10 MG tablet Take 10 mg by mouth daily.  06/27/20   [provider]  cephALEXin (KEFLEX) 500 MG capsule Take 1 capsule (500 mg total) by mouth 3 (three) times daily. 11/12/21   Trula Slade, DPM  chlorthalidone (HYGROTON) 25 MG tablet Take 25 mg by mouth daily. 06/27/20   [provider]  colchicine  (COLCRYS) 0.6 MG tablet Take 1 tablet (0.6 mg total) by mouth 2 (two) times daily. 09/01/19   Burchette, Alinda Sierras, MD  colchicine 0.6 MG tablet Take 1 tablet (0.6 mg total) by mouth daily. 10/17/21   Trula Slade, DPM  diclofenac sodium (VOLTAREN) 1 % GEL Apply 2 g topically 4 (four) times daily. 10/08/17   Burchette, Alinda Sierras, MD  diclofenac Sodium (VOLTAREN) 1 % GEL Apply 4 g topically 4 (four) times daily. 11/16/19   Burchette, Alinda Sierras, MD  diclofenac Sodium (VOLTAREN) 1 % GEL Apply topically. 07/06/21   [provider]  HYDROcodone-acetaminophen (NORCO/VICODIN) 5-325 MG tablet Take 1 tablet by mouth every 6 (six) hours as needed. 11/22/21   Trula Slade, DPM  MAGNESIUM-POTASSIUM PO Take by mouth.    [provider]  meloxicam (MOBIC) 15 MG tablet Take 1 tablet (15 mg total) by mouth daily as needed for pain. 11/22/21 11/22/22  Trula Slade, DPM  meloxicam (MOBIC) 7.5 MG tablet Take 1 tablet (7.5 mg total) by mouth daily as needed for pain. 08/07/21   Trula Slade, DPM  Multiple Vitamins-Minerals (MENS 50+ MULTI VITAMIN/MIN PO) Take 1 tablet by mouth daily.    [provider]  pantoprazole (PROTONIX) 40 MG tablet Take 1 tablet (40 mg total) by mouth daily. 11/25/19   Burchette,  Alinda Sierras, MD  promethazine (PHENERGAN) 25 MG tablet Take 1 tablet (25 mg total) by mouth every 8 (eight) hours as needed for nausea or vomiting. 10/17/21   Trula Slade, DPM  rosuvastatin (CRESTOR) 20 MG tablet Take 20 mg by mouth at bedtime. 03/02/21   [provider]  sildenafil (REVATIO) 20 MG tablet SMARTSIG:1-5 Tablet(s) By Mouth Daily PRN 08/01/21   [provider]  sulfamethoxazole-trimethoprim (BACTRIM DS) 800-160 MG tablet Take 1 tablet by mouth 2 (two) times daily. 10/04/21   Summerlin, Berneice Heinrich, PA-C  valsartan (DIOVAN) 320 MG tablet Take 320 mg by mouth daily. 08/03/20   [provider]  Vitamin D, Ergocalciferol, (DRISDOL) 1.25 MG (50000  UNIT) CAPS capsule Take 50,000 Units by mouth once a week. 06/08/20   [provider]      Allergies    Atorvastatin and Lisinopril    Review of Systems   Review of Systems  Physical Exam Updated Vital Signs BP (!) 162/96   Pulse 75   Temp 98.5 F (36.9 C)   Resp 20   Ht '5\' 9"'$  (1.753 m)   Wt 103 kg   SpO2 98%   BMI 33.53 kg/m  Physical Exam Vitals and nursing note reviewed.  Constitutional:      General: He is not in acute distress.    Appearance: Normal appearance.  HENT:     Head: Normocephalic and atraumatic.     Nose: Nose normal.     Mouth/Throat:     Mouth: Mucous membranes are moist.     Pharynx: Oropharynx is clear.  Eyes:     Extraocular Movements: Extraocular movements intact.     Conjunctiva/sclera: Conjunctivae normal.     Comments: Miotic right pupil, left pupil 3 mm and reactive  Cardiovascular:     Rate and Rhythm: Normal rate and regular rhythm.     Pulses: Normal pulses.     Heart sounds: Normal heart sounds.  Pulmonary:     Effort: Pulmonary effort is normal.     Breath sounds: Normal breath sounds.  Abdominal:     General: Abdomen is flat.     Palpations: Abdomen is soft.     Tenderness: There is no abdominal tenderness.  Musculoskeletal:        General: No tenderness. Normal range of motion.     Cervical back: Normal range of motion and neck supple.     Right lower leg: No edema.     Left lower leg: No edema.  Skin:    General: Skin is warm and dry.  Neurological:     Mental Status: He is alert and oriented to person, place, and time.     Comments: Subjectively decreased sensation to left upper lip Mild tongue deviation to left Remainder of cranial nerves intact Strength 5 out of 5 in bilateral upper and lower extremities, no drift Sensation intact in bilateral upper and lower extremities Normal finger-to-nose bilaterally  Psychiatric:        Mood and Affect: Mood normal.        Behavior: Behavior normal.     ED Results /  Procedures / Treatments   Labs (all labs ordered are listed, but only abnormal results are displayed) Labs Reviewed  COMPREHENSIVE METABOLIC PANEL - Abnormal; Notable for the following components:      Result Value   CO2 21 (*)    Glucose, Bld 100 (*)    Creatinine, Ser 1.35 (*)    Calcium 8.7 (*)  AST 58 (*)    ALT 70 (*)    GFR, Estimated 58 (*)    All other components within normal limits  CBC WITH DIFFERENTIAL/PLATELET - Abnormal; Notable for the following components:   WBC 3.8 (*)    Platelets 141 (*)    All other components within normal limits  CK - Abnormal; Notable for the following components:   Total CK 930 (*)    All other components within normal limits  MAGNESIUM    EKG None  Radiology No results found.  Procedures Procedures    Medications Ordered in ED Medications  sodium chloride 0.9 % bolus 1,000 mL (has no administration in time range)    ED Course/ Medical Decision Making/ A&P Clinical Course as of 06/15/22 2329  Sat Jun 15, 2022  2208 Patient's MRI without any acute obvious abnormalities.  Neurology will be consulted for recommendations for possible further TIA work-up. [VK]  2220 I spoke with Dr Rory Percy of neurology who recommended CTA head and neck to evaluate for critical stenosis. He states if CT imaging is normal, the patient is low risk and can be started on baby aspirin daily with neurology follow up. He states he will review the imaging when completed. 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 Recommend starting on DAPT and admit hospitalist to [facility]. [VK]    Clinical Course User Index [VK] Ottie Glazier, DO                           Medical Decision Making This patient presents to the ED with chief complaint(s) of left-sided numbness and muscle cramps with pertinent past medical history of hypertension, hyperlipidemia, gout which further complicates the presenting complaint. The  complaint involves an extensive differential diagnosis and also carries with it a high risk of complications and morbidity.    The differential diagnosis includes TIA, subacute stroke, electrolyte abnormality, rhabdo, ICH or mass effect less likely as no headaches or trauma  Additional history obtained: Additional history obtained from N/A Records reviewed Primary Care Documents  ED Course and Reassessment: Patient was initially evaluated by provider in triage and had labs performed to evaluate for electrolyte abnormality or rhabdo as causes of his symptoms.  He does have a mild CK elevation of 900 and a mild creatinine elevation of 1.3 from his baseline around 1.1.  He will be given IV fluids.  Due to patient's intermittent left-sided numbness, concern for TIA and he will have MRI for TIA work-up.  Patient is declining any pain medication at this time.  Independent labs interpretation:  The following labs were independently interpreted: Mildly elevated CK  Independent visualization of imaging: - I independently visualized the following imaging with scope of interpretation limited to determining acute life threatening conditions related to emergency care: MRI brain/CTA head, which revealed MRI brain negative, CTA with severe R P2 stenosis  Consultation: - Consulted or discussed management/test interpretation w/ external professional: Neurology  Consideration for admission or further workup: Patient requires admission for further work-up for TIA evaluation Social Determinants of health: N/A    Amount and/or Complexity of Data Reviewed Radiology: ordered.  Risk Prescription drug management. Decision regarding hospitalization.          Final Clinical Impression(s) / ED Diagnoses Final diagnoses:  None    Rx / DC Orders ED Discharge Orders     None  Ottie Glazier, DO 06/15/22 2332

## 2022-06-15 NOTE — ED Notes (Signed)
Patient remains in MRI 

## 2022-06-15 NOTE — ED Triage Notes (Signed)
Pt reports intermittent cramping to bilateral legs and numbness to left side of body with dizziness x1 month. Worse with activity.

## 2022-06-15 NOTE — ED Provider Notes (Signed)
Case was discussed with Dr. Velia Meyer I have [facility], who agrees to admit the patient.   Delora Fuel, MD 36/46/80 2348

## 2022-06-15 NOTE — ED Notes (Signed)
Patient transported to CT 

## 2022-06-15 NOTE — ED Provider Triage Note (Signed)
Emergency Medicine Provider Triage Evaluation Note  [name] , a 66 y.o. male  was evaluated in triage.  Pt complains of cramping in his bilateral legs and numbness to left side of his body. States that he has had these symptoms intermittently over the past month. States this has happened randomly and also with activity like mowing his yard or walking to [employer]. Feels presyncope but denies LOC. Denies CP or SOB  Review of Systems  Positive: See above Negative:   Physical Exam  BP (!) 162/96   Pulse 75   Temp 98.5 F (36.9 C)   Resp 20   Ht '5\' 9"'$  (1.753 m)   Wt 103 kg   SpO2 98%   BMI 33.53 kg/m  Gen:   Awake, no distress   Resp:  Normal effort  MSK:   Moves extremities without difficulty  Other:    Medical Decision Making  Medically screening exam initiated at 5:44 PM.  Appropriate orders placed.  [name] was informed that the remainder of the evaluation will be completed by another provider, this initial triage assessment does not replace that evaluation, and the importance of remaining in the ED until their evaluation is complete.     Mickie Hillier, PA-C 06/15/22 1745

## 2022-06-15 NOTE — Plan of Care (Signed)
ON CALL PHONE CONSULT  Call from Dr Ernesto Rutherford @ [facility]   Discussion: multiple weeks of transient left sided numbness, now asymptomatic, MRI brain negative.  Recommended getting a CTA head and neck which shows a significant stenosis of the right P2 PCA. 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 Work-up should include 2D echo, telemetry, A1c, lipid panel, frequent neurochecks, PT OT and speech therapy assessments. Recommend loading him with Plavix 300 and aspirin 650 and then starting him from tomorrow on aspirin 81 and Plavix 75. We will see him once he arrives at [place], MD Neurologist [facility] Pager: [phone]

## 2022-06-15 NOTE — H&P (Signed)
History and Physical    PLEASE NOTE THAT DRAGON DICTATION SOFTWARE WAS USED IN THE CONSTRUCTION OF THIS NOTE.   [name]:[id number] DOB: August 30, 1956 DOA: 06/15/2022  PCP: Mittie Bodo *** Patient coming from: home ***  I have personally briefly reviewed patient's old medical records in [place]  Chief Complaint: ***  HPI: [name] is a 66 y.o. male with medical history significant for *** who is admitted to [facility] on 06/15/2022 with *** after presenting from home*** to [facility] ED complaining of ***.    ***    ***SOB: Denies any associated orthopnea, PND, or new onset peripheral edema. No recent chest pain, diaphoresis, palpitations, N/V, pre-syncope, or syncope. Not associated with any recent cough, wheezing, hemoptysis, new lower extremity erythema, or calf tenderness. Denies any recent trauma, travel, surgical procedures, or periods of prolonged diminished ambulatory status. No recent melena or hematochezia.   Denies any associated subjective fever, chills, rigors, or generalized myalgias. No recent headache, neck stiffness, rhinitis, rhinorrhea, sore throat, abdominal pain, diarrhea, or rash. No known recent COVID-19 exposures. Denies dysuria, gross hematuria, or change in urinary urgency/frequency.  ***   ***misc/infectious: Denies any subjective fever, chills, rigors, or generalized myalgias. Denies any recent headache, neck stiffness, rhinitis, rhinorrhea, sore throat, sob, wheezing, cough, nausea, vomiting, abdominal pain, diarrhea, or rash. No recent traveling or known COVID-19 exposures. Denies dysuria, gross hematuria, or change in urinary urgency/frequency.  Denies any recent chest pain, diaphoresis, or palpitations. ***    ED Course:  Vital signs in the ED were notable for the following: ***  Labs were notable for the following: ***  Imaging and additional notable ED work-up: ***  While in the ED, the following were  administered: ***  Subsequently, the patient was admitted  ***  ***red   Review of Systems: As per HPI otherwise 10 point review of systems negative.   Past Medical History:  Diagnosis Date   Epididymitis    GERD (gastroesophageal reflux disease)    Gout of big toe 05/21/2013   Hard of hearing    History of gout    Hx of adenomatous polyp of colon 02/01/2017   Hyperlipidemia    Hypertension    Orchitis    Sepsis ([place]) hospitalized 12/25/2015   Urethral stricture     Past Surgical History:  Procedure Laterality Date   COLONOSCOPY     CYSTOSCOPY WITH DIRECT VISION INTERNAL URETHROTOMY N/A 01/25/2016   Procedure:  DIRECT VISION INTERNAL URETHROTOMY;  Surgeon: Ardis Hughs, MD;  Location: [facility];  Service: Urology;  Laterality: N/A;   CYSTOSCOPY WITH RETROGRADE URETHROGRAM N/A 01/25/2016   Procedure:  RETROGRADE URETHROGRAM;  Surgeon: Ardis Hughs, MD;  Location: [facility];  Service: Urology;  Laterality: N/A;   INGUINAL HERNIA REPAIR Right    POLYPECTOMY     UPPER GASTROINTESTINAL ENDOSCOPY     WISDOM TOOTH EXTRACTION      Social History:  reports that he has never smoked. He has never used smokeless tobacco. He reports current alcohol use of about 6.0 standard drinks of alcohol per week. He reports that he does not use drugs.   Allergies  Allergen Reactions   Atorvastatin Other (See Comments)    myalgia   Lisinopril Cough    Family History  Problem Relation Age of Onset   Diabetes Mellitus II Paternal Grandfather    Throat cancer Sister    Other Brother        Muscle  cramps   Colon cancer Neg Hx    Esophageal cancer Neg Hx    Pancreatic cancer Neg Hx    Prostate cancer Neg Hx    Rectal cancer Neg Hx    Stomach cancer Neg Hx    Colon polyps Neg Hx     Family history reviewed and not pertinent ***   Prior to Admission medications   Medication Sig Start Date End Date Taking? Authorizing Provider  allopurinol (ZYLOPRIM) 100 MG tablet TAKE 1 TABLET(100  MG) BY MOUTH DAILY 05/20/22   Trula Slade, DPM  amLODipine (NORVASC) 5 MG tablet TAKE 1 TABLET(5 MG) BY MOUTH DAILY 06/03/20   Burchette, Alinda Sierras, MD  atorvastatin (LIPITOR) 10 MG tablet Take 10 mg by mouth daily.  06/27/20   [provider]  cephALEXin (KEFLEX) 500 MG capsule Take 1 capsule (500 mg total) by mouth 3 (three) times daily. 11/12/21   Trula Slade, DPM  chlorthalidone (HYGROTON) 25 MG tablet Take 25 mg by mouth daily. 06/27/20   [provider]  colchicine (COLCRYS) 0.6 MG tablet Take 1 tablet (0.6 mg total) by mouth 2 (two) times daily. 09/01/19   Burchette, Alinda Sierras, MD  colchicine 0.6 MG tablet Take 1 tablet (0.6 mg total) by mouth daily. 10/17/21   Trula Slade, DPM  diclofenac sodium (VOLTAREN) 1 % GEL Apply 2 g topically 4 (four) times daily. 10/08/17   Burchette, Alinda Sierras, MD  diclofenac Sodium (VOLTAREN) 1 % GEL Apply 4 g topically 4 (four) times daily. 11/16/19   Burchette, Alinda Sierras, MD  diclofenac Sodium (VOLTAREN) 1 % GEL Apply topically. 07/06/21   [provider]  HYDROcodone-acetaminophen (NORCO/VICODIN) 5-325 MG tablet Take 1 tablet by mouth every 6 (six) hours as needed. 11/22/21   Trula Slade, DPM  MAGNESIUM-POTASSIUM PO Take by mouth.    [provider]  meloxicam (MOBIC) 15 MG tablet Take 1 tablet (15 mg total) by mouth daily as needed for pain. 11/22/21 11/22/22  Trula Slade, DPM  meloxicam (MOBIC) 7.5 MG tablet Take 1 tablet (7.5 mg total) by mouth daily as needed for pain. 08/07/21   Trula Slade, DPM  Multiple Vitamins-Minerals (MENS 50+ MULTI VITAMIN/MIN PO) Take 1 tablet by mouth daily.    [provider]  pantoprazole (PROTONIX) 40 MG tablet Take 1 tablet (40 mg total) by mouth daily. 11/25/19   Burchette, Alinda Sierras, MD  promethazine (PHENERGAN) 25 MG tablet Take 1 tablet (25 mg total) by mouth every 8 (eight) hours as needed for nausea or vomiting. 10/17/21   Trula Slade, DPM  rosuvastatin  (CRESTOR) 20 MG tablet Take 20 mg by mouth at bedtime. 03/02/21   [provider]  sildenafil (REVATIO) 20 MG tablet SMARTSIG:1-5 Tablet(s) By Mouth Daily PRN 08/01/21   [provider]  sulfamethoxazole-trimethoprim (BACTRIM DS) 800-160 MG tablet Take 1 tablet by mouth 2 (two) times daily. 10/04/21   Summerlin, Berneice Heinrich, PA-C  valsartan (DIOVAN) 320 MG tablet Take 320 mg by mouth daily. 08/03/20   [provider]  Vitamin D, Ergocalciferol, (DRISDOL) 1.25 MG (50000 UNIT) CAPS capsule Take 50,000 Units by mouth once a week. 06/08/20   [provider]     Objective    Physical Exam: Vitals:   06/15/22 2133 06/15/22 2200 06/15/22 2215 06/15/22 2323  BP: (!) 174/108 (!) 168/101 (!) 168/94 (!) 189/109  Pulse: 65 60 60 60  Resp:    16  Temp:  TempSrc:      SpO2: 98% 100% 97% 100%  Weight:      Height:        General: appears to be stated age; alert, oriented Skin: warm, dry, no rash Head:  AT/[place] Mouth:  Oral mucosa membranes appear moist, normal dentition Neck: supple; trachea midline Heart:  RRR; did not appreciate any M/R/G Lungs: CTAB, did not appreciate any wheezes, rales, or rhonchi Abdomen: + BS; soft, ND, NT Vascular: 2+ pedal pulses b/l; 2+ radial pulses b/l Extremities: no peripheral edema, no muscle wasting Neuro: strength and sensation intact in upper and lower extremities b/l    *** Neuro: 5/5 strength of the proximal and distal flexors and extensors of the upper and lower extremities bilaterally; sensation intact in upper and lower extremities b/l; cranial nerves II through XII grossly intact; no pronator drift; no evidence suggestive of slurred speech, dysarthria, or facial droop; Normal muscle tone. No tremors. *** Neuro: In the setting of the patient's current mental status and associated inability to follow instructions, unable to perform full neurologic exam at this time.  As such, assessment of strength, sensation, and  cranial nerves is limited at this time. Patient noted to spontaneously move all 4 extremities. No tremors.  ***    Labs on Admission: I have personally reviewed following labs and imaging studies  CBC: Recent Labs  Lab 06/15/22 1801  WBC 3.8*  NEUTROABS 1.7  HGB 15.1  HCT 45.0  MCV 85.9  PLT 789*   Basic Metabolic Panel: Recent Labs  Lab 06/15/22 1801  NA 136  K 4.1  CL 108  CO2 21*  GLUCOSE 100*  BUN 17  CREATININE 1.35*  CALCIUM 8.7*  MG 2.1   GFR: Estimated Creatinine Clearance: 64.5 mL/min (A) (by C-G formula based on SCr of 1.35 mg/dL (H)). Liver Function Tests: Recent Labs  Lab 06/15/22 1801  AST 58*  ALT 70*  ALKPHOS 44  BILITOT 0.7  PROT 8.0  ALBUMIN 3.9   No results for input(s): "LIPASE", "AMYLASE" in the last 168 hours. No results for input(s): "AMMONIA" in the last 168 hours. Coagulation Profile: No results for input(s): "INR", "PROTIME" in the last 168 hours. Cardiac Enzymes: Recent Labs  Lab 06/15/22 1829  CKTOTAL 930*   BNP (last 3 results) No results for input(s): "PROBNP" in the last 8760 hours. HbA1C: No results for input(s): "HGBA1C" in the last 72 hours. CBG: No results for input(s): "GLUCAP" in the last 168 hours. Lipid Profile: No results for input(s): "CHOL", "HDL", "LDLCALC", "TRIG", "CHOLHDL", "LDLDIRECT" in the last 72 hours. Thyroid Function Tests: No results for input(s): "TSH", "T4TOTAL", "FREET4", "T3FREE", "THYROIDAB" in the last 72 hours. Anemia Panel: No results for input(s): "VITAMINB12", "FOLATE", "FERRITIN", "TIBC", "IRON", "RETICCTPCT" in the last 72 hours. Urine analysis:    Component Value Date/Time   COLORURINE STRAW (A) 03/23/2020 1809   APPEARANCEUR Cloudy (A) 10/04/2021 1655   LABSPEC 1.006 03/23/2020 1809   PHURINE 6.0 03/23/2020 1809   GLUCOSEU Negative 10/04/2021 1655   HGBUR NEGATIVE 03/23/2020 1809   BILIRUBINUR Negative 10/04/2021 1655   KETONESUR NEGATIVE 03/23/2020 1809   PROTEINUR  Negative 10/04/2021 1655   PROTEINUR NEGATIVE 03/23/2020 1809   UROBILINOGEN 0.2 04/14/2019 1551   UROBILINOGEN 1.0 05/05/2010 0642   NITRITE Positive (A) 10/04/2021 1655   NITRITE NEGATIVE 03/23/2020 1809   LEUKOCYTESUR 1+ (A) 10/04/2021 1655   LEUKOCYTESUR TRACE (A) 03/23/2020 1809    Radiological Exams on Admission: CT ANGIO HEAD NECK W WO CM  Result Date: 06/15/2022 CLINICAL DATA:  Initial evaluation for left-sided numbness. EXAM: CT ANGIOGRAPHY HEAD AND NECK TECHNIQUE: Multidetector CT imaging of the head and neck was performed using the standard protocol during bolus administration of intravenous contrast. Multiplanar CT image reconstructions and MIPs were obtained to evaluate the vascular anatomy. Carotid stenosis measurements (when applicable) are obtained utilizing NASCET criteria, using the distal internal carotid diameter as the denominator. RADIATION DOSE REDUCTION: This exam was performed according to the departmental dose-optimization program which includes automated exposure control, adjustment of the mA and/or kV according to patient size and/or use of iterative reconstruction technique. CONTRAST:  9m OMNIPAQUE IOHEXOL 350 MG/ML SOLN COMPARISON:  Prior brain MRI from earlier the same day. FINDINGS: CT HEAD FINDINGS Brain: Cerebral volume within normal limits for patient age. No evidence for acute intracranial hemorrhage. No findings to suggest acute large vessel territory infarct. No mass lesion, midline shift, or mass effect. Ventricles are normal in size without evidence for hydrocephalus. No extra-axial fluid collection identified. Vascular: No hyperdense vessel identified. Skull: Scalp soft tissues demonstrate no acute abnormality. Calvarium intact. Sinuses/Orbits: Globes and orbital soft tissues within normal limits. Visualized paranasal sinuses are clear. No mastoid effusion. CTA NECK FINDINGS Aortic arch: Visualized aortic arch normal in caliber with normal branch pattern. Mild  atheromatous change about the arch itself. No significant stenosis about the origin of the great vessels. Right carotid system: Right common and internal carotid arteries widely patent without stenosis, dissection or occlusion. Left carotid system: Left common and internal carotid arteries widely patent without stenosis, dissection or occlusion. Vertebral arteries: Both vertebral arteries arise from the subclavian arteries. No proximal subclavian artery stenosis. Both vertebral arteries widely patent without stenosis, dissection or occlusion. Skeleton: No discrete or worrisome osseous lesions. Moderate cervical spondylosis noted. Other neck: No other acute soft tissue abnormality within the neck. Upper chest: Visualized upper chest demonstrates no acute finding. Review of the MIP images confirms the above findings CTA HEAD FINDINGS Anterior circulation: Petrous segments patent bilaterally. Mild for age atheromatous change within the carotid siphons without significant stenosis. A1 segments patent bilaterally. Normal anterior communicating artery complex. Anterior cerebral arteries patent without stenosis. No M1 stenosis or occlusion. No proximal MCA branch occlusion. Distal MCA branches perfused and symmetric. Posterior circulation: Both V4 segments widely patent. Neither PICA origin well visualized. Basilar widely patent to its distal aspect without stenosis. Superior cerebral arteries patent bilaterally. Both PCAs primarily supplied via the basilar. Left PCA widely patent to its distal aspect. There is a severe somewhat long segment stenosis involving the right P2 segment (series 12, image 59). This measures approximately 9 mm in length. Right PCA otherwise patent to its distal aspect. Venous sinuses: Patent allowing for timing the contrast bolus. Anatomic variants: None significant.  No aneurysm. Review of the MIP images confirms the above findings IMPRESSION: 1. Negative CTA for emergent large vessel occlusion.  2. 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 Finding could potentially contribute to left-sided numbness. 3. Otherwise wide patency of the major arterial vasculature of the head and neck. Mild atheromatous disease for age, with no other hemodynamically significant or correctable stenosis. Electronically Signed   By: BJeannine BogaM.D.   On: 06/15/2022 23:44   MR Brain Wo Contrast (neuro protocol)  Result Date: 06/15/2022 CLINICAL DATA:  Initial evaluation for acute TIA. EXAM: MRI HEAD WITHOUT CONTRAST TECHNIQUE: Multiplanar, multiecho pulse sequences of the brain and surrounding structures were obtained without intravenous contrast. COMPARISON:  CT from 12/27/2015. FINDINGS: Brain:  Cerebral volume within normal limits. Minimal scattered T2/FLAIR hyperintensity noted involving the supratentorial cerebral white matter, nonspecific, but most commonly related to chronic microvascular ischemic disease. Overall, changes are minimal in nature, and felt to be within normal limits for age. No evidence for acute or subacute ischemia. Gray-white matter differentiation maintained. No areas of chronic cortical infarction. No acute or chronic intracranial blood products. No mass lesion, midline shift or mass effect. No hydrocephalus or extra-axial fluid collection. Pituitary gland and suprasellar region normal. Midline structures intact. Vascular: Major intracranial vascular flow voids are maintained. Asymmetric FLAIR hyperintensity involving the right transverse sinus most likely related to slow/sluggish flow. Skull and upper cervical spine: Craniocervical junction within normal limits. Bone marrow signal intensity overall felt to be within normal limits. No scalp soft tissue abnormality. Sinuses/Orbits: Globes orbital soft tissues demonstrate no acute finding. Scattered mucosal thickening noted about the ethmoidal air cells and maxillary sinuses. Paranasal sinuses are otherwise clear. No mastoid  effusion. Other: None. IMPRESSION: Normal brain MRI for age.  No acute intracranial abnormality. Electronically Signed   By: Jeannine Boga M.D.   On: 06/15/2022 21:47     EKG: Independently reviewed, with result as described above. ***   Assessment/Plan    Principal Problem:   TIA (transient ischemic attack)  ***      ***          ***           ***          ***          ***          ***          ***          ***     ***  DVT prophylaxis: SCD's ***  Code Status: Full code*** Family Communication: none*** Disposition Plan: Per Rounding Team Consults called: none***;  Admission status: ***   PLEASE NOTE THAT DRAGON DICTATION SOFTWARE WAS USED IN THE CONSTRUCTION OF THIS NOTE.   [place] DO [facility] [place]   06/15/2022, 11:54 PM   ***

## 2022-06-15 NOTE — ED Notes (Signed)
Patient transported to MRI 

## 2022-06-16 ENCOUNTER — Other Ambulatory Visit: Payer: Self-pay

## 2022-06-16 ENCOUNTER — Observation Stay (HOSPITAL_BASED_OUTPATIENT_CLINIC_OR_DEPARTMENT_OTHER): Payer: Medicare Other

## 2022-06-16 ENCOUNTER — Encounter (HOSPITAL_COMMUNITY): Payer: Self-pay | Admitting: Internal Medicine

## 2022-06-16 DIAGNOSIS — G459 Transient cerebral ischemic attack, unspecified: Secondary | ICD-10-CM | POA: Diagnosis not present

## 2022-06-16 DIAGNOSIS — I10 Essential (primary) hypertension: Secondary | ICD-10-CM | POA: Diagnosis not present

## 2022-06-16 DIAGNOSIS — Z79899 Other long term (current) drug therapy: Secondary | ICD-10-CM | POA: Diagnosis not present

## 2022-06-16 DIAGNOSIS — K219 Gastro-esophageal reflux disease without esophagitis: Secondary | ICD-10-CM | POA: Diagnosis present

## 2022-06-16 DIAGNOSIS — R748 Abnormal levels of other serum enzymes: Secondary | ICD-10-CM | POA: Diagnosis present

## 2022-06-16 DIAGNOSIS — R7401 Elevation of levels of liver transaminase levels: Secondary | ICD-10-CM | POA: Diagnosis not present

## 2022-06-16 DIAGNOSIS — R200 Anesthesia of skin: Secondary | ICD-10-CM | POA: Diagnosis present

## 2022-06-16 DIAGNOSIS — E669 Obesity, unspecified: Secondary | ICD-10-CM | POA: Diagnosis not present

## 2022-06-16 DIAGNOSIS — Z6833 Body mass index (BMI) 33.0-33.9, adult: Secondary | ICD-10-CM | POA: Diagnosis not present

## 2022-06-16 DIAGNOSIS — E785 Hyperlipidemia, unspecified: Secondary | ICD-10-CM | POA: Diagnosis present

## 2022-06-16 LAB — ECHOCARDIOGRAM COMPLETE BUBBLE STUDY
AR max vel: 1.53 cm2
AV Area VTI: 1.95 cm2
AV Area mean vel: 1.77 cm2
AV Mean grad: 3 mmHg
AV Peak grad: 5.9 mmHg
Ao pk vel: 1.21 m/s
Area-P 1/2: 2.56 cm2
Calc EF: 49.2 %
MV M vel: 2.86 m/s
MV Peak grad: 32.7 mmHg
P 1/2 time: 1261 msec
S' Lateral: 3.8 cm
Single Plane A2C EF: 56.3 %
Single Plane A4C EF: 45 %

## 2022-06-16 LAB — COMPREHENSIVE METABOLIC PANEL
ALT: 56 U/L — ABNORMAL HIGH (ref 0–44)
AST: 42 U/L — ABNORMAL HIGH (ref 15–41)
Albumin: 3.1 g/dL — ABNORMAL LOW (ref 3.5–5.0)
Alkaline Phosphatase: 36 U/L — ABNORMAL LOW (ref 38–126)
Anion gap: 7 (ref 5–15)
BUN: 17 mg/dL (ref 8–23)
CO2: 22 mmol/L (ref 22–32)
Calcium: 8.2 mg/dL — ABNORMAL LOW (ref 8.9–10.3)
Chloride: 107 mmol/L (ref 98–111)
Creatinine, Ser: 1.3 mg/dL — ABNORMAL HIGH (ref 0.61–1.24)
GFR, Estimated: >60 mL/min (ref >60)
Glucose, Bld: 86 mg/dL (ref 70–99)
Potassium: 3.7 mmol/L (ref 3.5–5.1)
Sodium: 136 mmol/L (ref 135–145)
Total Bilirubin: 0.6 mg/dL (ref 0.3–1.2)
Total Protein: 6.6 g/dL (ref 6.5–8.1)

## 2022-06-16 LAB — URINALYSIS, COMPLETE (UACMP) WITH MICROSCOPIC
Bacteria, UA: NONE SEEN
Bilirubin Urine: NEGATIVE
Glucose, UA: NEGATIVE mg/dL
Hgb urine dipstick: NEGATIVE
Ketones, ur: NEGATIVE mg/dL
Nitrite: NEGATIVE
Protein, ur: NEGATIVE mg/dL
Specific Gravity, Urine: 1.041 — ABNORMAL HIGH (ref 1.005–1.030)
WBC, UA: >50 WBC/hpf — ABNORMAL HIGH (ref 0–5)
pH: 5 (ref 5.0–8.0)

## 2022-06-16 LAB — CBC WITH DIFFERENTIAL/PLATELET
Abs Immature Granulocytes: 0.04 10*3/uL (ref 0.00–0.07)
Basophils Absolute: 0 10*3/uL (ref 0.0–0.1)
Basophils Relative: 1 %
Eosinophils Absolute: 0.2 10*3/uL (ref 0.0–0.5)
Eosinophils Relative: 5 %
HCT: 40.9 % (ref 39.0–52.0)
Hemoglobin: 13.7 g/dL (ref 13.0–17.0)
Immature Granulocytes: 1 %
Lymphocytes Relative: 52 %
Lymphs Abs: 1.8 10*3/uL (ref 0.7–4.0)
MCH: 28.8 pg (ref 26.0–34.0)
MCHC: 33.5 g/dL (ref 30.0–36.0)
MCV: 86.1 fL (ref 80.0–100.0)
Monocytes Absolute: 0.5 10*3/uL (ref 0.1–1.0)
Monocytes Relative: 15 %
Neutro Abs: 0.9 10*3/uL — ABNORMAL LOW (ref 1.7–7.7)
Neutrophils Relative %: 26 %
Platelets: 112 10*3/uL — ABNORMAL LOW (ref 150–400)
RBC: 4.75 MIL/uL (ref 4.22–5.81)
RDW: 14.9 % (ref 11.5–15.5)
WBC: 3.5 10*3/uL — ABNORMAL LOW (ref 4.0–10.5)
nRBC: 0 % (ref 0.0–0.2)

## 2022-06-16 LAB — LIPID PANEL
Cholesterol: 160 mg/dL (ref 0–200)
HDL: 29 mg/dL — ABNORMAL LOW (ref >40)
LDL Cholesterol: 99 mg/dL (ref 0–99)
Total CHOL/HDL Ratio: 5.5 RATIO
Triglycerides: 159 mg/dL — ABNORMAL HIGH (ref <150)
VLDL: 32 mg/dL (ref 0–40)

## 2022-06-16 LAB — CK: Total CK: 920 U/L — ABNORMAL HIGH (ref 49–397)

## 2022-06-16 LAB — RAPID URINE DRUG SCREEN, HOSP PERFORMED
Amphetamines: NOT DETECTED
Barbiturates: NOT DETECTED
Benzodiazepines: NOT DETECTED
Cocaine: NOT DETECTED
Opiates: NOT DETECTED
Tetrahydrocannabinol: NOT DETECTED

## 2022-06-16 LAB — ETHANOL: Alcohol, Ethyl (B): <10 mg/dL (ref <10)

## 2022-06-16 LAB — PROTIME-INR
INR: 1.1 (ref 0.8–1.2)
Prothrombin Time: 14.5 seconds (ref 11.4–15.2)

## 2022-06-16 LAB — HEMOGLOBIN A1C
Hgb A1c MFr Bld: 5.5 % (ref 4.8–5.6)
Mean Plasma Glucose: 111.15 mg/dL

## 2022-06-16 LAB — MAGNESIUM: Magnesium: 2.2 mg/dL (ref 1.7–2.4)

## 2022-06-16 LAB — TSH: TSH: 1.71 u[IU]/mL (ref 0.350–4.500)

## 2022-06-16 MED ORDER — ROSUVASTATIN CALCIUM 20 MG PO TABS
20.0000 mg | ORAL_TABLET | Freq: Every day | ORAL | Status: DC
  Administered 2022-06-16: 20 mg via ORAL
  Filled 2022-06-16: qty 1

## 2022-06-16 MED ORDER — CLOPIDOGREL BISULFATE 75 MG PO TABS: 75.0000 mg | ORAL_TABLET | Freq: Every day | ORAL | Status: DC

## 2022-06-16 MED ORDER — ASPIRIN 81 MG PO CHEW: 81.0000 mg | CHEWABLE_TABLET | Freq: Every day | ORAL | Status: AC

## 2022-06-16 MED ORDER — ROSUVASTATIN CALCIUM 20 MG PO TABS: 20.0000 mg | ORAL_TABLET | Freq: Every day | ORAL | 0 refills | Status: AC

## 2022-06-16 MED ORDER — LACTATED RINGERS IV SOLN: INTRAVENOUS | Status: AC

## 2022-06-16 MED ORDER — PANTOPRAZOLE SODIUM 40 MG PO TBEC
40.0000 mg | DELAYED_RELEASE_TABLET | Freq: Every day | ORAL | Status: DC
  Administered 2022-06-16: 40 mg via ORAL
  Filled 2022-06-16: qty 1

## 2022-06-16 MED ORDER — CLOPIDOGREL BISULFATE 75 MG PO TABS: 75.0000 mg | ORAL_TABLET | Freq: Every day | ORAL | 0 refills | Status: AC

## 2022-06-16 MED ORDER — HYDRALAZINE HCL 20 MG/ML IJ SOLN: 10.0000 mg | INTRAMUSCULAR | Status: DC | PRN

## 2022-06-16 NOTE — Progress Notes (Signed)
PT Cancellation Note  Patient Details Name: [name] MRN: [id number] DOB: 1956/05/03   Cancelled Treatment:    Reason Eval/Treat Not Completed: PT screened, no needs identified, will sign off. Pt reports resolution of all symptoms, denies concerns about mobility, refusing PT eval at this time. PT will sign off currently, if any mobility concerns arise please re-consult.   DEMTRIUS ROUNDS 06/16/2022, 5:32 PM

## 2022-06-16 NOTE — Progress Notes (Signed)
  Echocardiogram 2D Echocardiogram has been performed.  [name] 06/16/2022, 9:58 AM

## 2022-06-16 NOTE — ED Notes (Signed)
ED TO INPATIENT HANDOFF REPORT  ED Nurse Name and Phone #: [phone]  S Name/Age/Gender [name] 66 y.o. male Room/Bed: WA13/WA13  Code Status   Code Status: Full Code  Home/SNF/Other Home Patient oriented to: self, place, time, and situation Is this baseline? Yes   Triage Complete: Triage complete  Chief Complaint TIA (transient ischemic attack) [G45.9]  Triage Note Pt reports intermittent cramping to bilateral legs and numbness to left side of body with dizziness x1 month. Worse with activity.    Allergies Allergies  Allergen Reactions   Atorvastatin Other (See Comments)    myalgia   Lisinopril Cough    Level of Care/Admitting Diagnosis ED Disposition     ED Disposition  Admit   Condition  --   Comment  Hospital Area: [facility] [[id number]]  Level of Care: Telemetry Medical [104]  May place patient in observation at [facility] or [facility] if equivalent level of care is available:: No  Covid Evaluation: Asymptomatic - no recent exposure (last 10 days) testing not required  Diagnosis: TIA (transient ischemic attack) [700174]  Admitting Physician: [name] [[id number]]  Attending Physician: [name] [[id number]]          B Medical/Surgery History Past Medical History:  Diagnosis Date   Epididymitis    GERD (gastroesophageal reflux disease)    Gout of big toe 05/21/2013   Hard of hearing    History of gout    Hx of adenomatous polyp of colon 02/01/2017   Hyperlipidemia    Hypertension    Orchitis    Sepsis ([place]) hospitalized 12/25/2015   Urethral stricture    Past Surgical History:  Procedure Laterality Date   COLONOSCOPY     CYSTOSCOPY WITH DIRECT VISION INTERNAL URETHROTOMY N/A 01/25/2016   Procedure:  DIRECT VISION INTERNAL URETHROTOMY;  Surgeon: Ardis Hughs, MD;  Location: [facility];  Service: Urology;  Laterality: N/A;   CYSTOSCOPY WITH RETROGRADE URETHROGRAM N/A 01/25/2016   Procedure:  RETROGRADE  URETHROGRAM;  Surgeon: Ardis Hughs, MD;  Location: [facility];  Service: Urology;  Laterality: N/A;   INGUINAL HERNIA REPAIR Right    POLYPECTOMY     UPPER GASTROINTESTINAL ENDOSCOPY     WISDOM TOOTH EXTRACTION       A IV Location/Drains/Wounds Patient Lines/Drains/Airways Status     Active Line/Drains/Airways     Name Placement date Placement time Site Days   Peripheral IV 06/15/22 22 G 1" Right;Posterior Hand 06/15/22  2002  Hand  1   Peripheral IV 06/15/22 20 G Left Antecubital 06/15/22  2224  Antecubital  1   Incision (Closed) 01/25/16 Penis 01/25/16  1441  -- 2334            Intake/Output Last 24 hours  Intake/Output Summary (Last 24 hours) at 06/16/2022 1355 Last data filed at 06/16/2022 1149 Gross per 24 hour  Intake 1000 ml  Output 300 ml  Net 700 ml    Labs/Imaging Results for orders placed or performed during the hospital encounter of 06/15/22 (from the past 48 hour(s))  Comprehensive metabolic panel     Status: Abnormal   Collection Time: 06/15/22  6:01 PM  Result Value Ref Range   Sodium 136 135 - 145 mmol/L   Potassium 4.1 3.5 - 5.1 mmol/L   Chloride 108 98 - 111 mmol/L   CO2 21 (L) 22 - 32 mmol/L   Glucose, Bld 100 (H) 70 - 99 mg/dL    Comment: Glucose reference range  applies only to samples taken after fasting for at least 8 hours.   BUN 17 8 - 23 mg/dL   Creatinine, Ser 1.35 (H) 0.61 - 1.24 mg/dL   Calcium 8.7 (L) 8.9 - 10.3 mg/dL   Total Protein 8.0 6.5 - 8.1 g/dL   Albumin 3.9 3.5 - 5.0 g/dL   AST 58 (H) 15 - 41 U/L   ALT 70 (H) 0 - 44 U/L   Alkaline Phosphatase 44 38 - 126 U/L   Total Bilirubin 0.7 0.3 - 1.2 mg/dL   GFR, Estimated 58 (L) >60 mL/min    Comment: (NOTE) Calculated using the CKD-EPI Creatinine Equation (2021)    Anion gap 7 5 - 15    Comment: Performed at [facility], [place] [address], [place], [place] [address]  CBC with Differential     Status: Abnormal   Collection Time: 06/15/22  6:01 PM  Result Value  Ref Range   WBC 3.8 (L) 4.0 - 10.5 K/uL   RBC 5.24 4.22 - 5.81 MIL/uL   Hemoglobin 15.1 13.0 - 17.0 g/dL   HCT 45.0 39.0 - 52.0 %   MCV 85.9 80.0 - 100.0 fL   MCH 28.8 26.0 - 34.0 pg   MCHC 33.6 30.0 - 36.0 g/dL   RDW 15.1 11.5 - 15.5 %   Platelets 141 (L) 150 - 400 K/uL   nRBC 0.0 0.0 - 0.2 %   Neutrophils Relative % 46 %   Neutro Abs 1.7 1.7 - 7.7 K/uL   Lymphocytes Relative 35 %   Lymphs Abs 1.3 0.7 - 4.0 K/uL   Monocytes Relative 15 %   Monocytes Absolute 0.6 0.1 - 1.0 K/uL   Eosinophils Relative 3 %   Eosinophils Absolute 0.1 0.0 - 0.5 K/uL   Basophils Relative 1 %   Basophils Absolute 0.0 0.0 - 0.1 K/uL   Immature Granulocytes 0 %   Abs Immature Granulocytes 0.00 0.00 - 0.07 K/uL    Comment: Performed at [facility], [place] [address], [place], [place] [address]  Magnesium     Status: None   Collection Time: 06/15/22  6:01 PM  Result Value Ref Range   Magnesium 2.1 1.7 - 2.4 mg/dL    Comment: Performed at [facility], [place] [address], [place], [place] [address]  CK     Status: Abnormal   Collection Time: 06/15/22  6:29 PM  Result Value Ref Range   Total CK 930 (H) 49 - 397 U/L    Comment: Performed at [facility], [place] [address] [place],  [address]  Rapid urine drug screen (hospital performed)     Status: None   Collection Time: 06/16/22 12:35 AM  Result Value Ref Range   Opiates NONE DETECTED NONE DETECTED   Cocaine NONE DETECTED NONE DETECTED   Benzodiazepines NONE DETECTED NONE DETECTED   Amphetamines NONE DETECTED NONE DETECTED   Tetrahydrocannabinol NONE DETECTED NONE DETECTED   Barbiturates NONE DETECTED NONE DETECTED    Comment: (NOTE) DRUG SCREEN FOR MEDICAL PURPOSES ONLY.  IF CONFIRMATION IS NEEDED FOR ANY PURPOSE, NOTIFY LAB WITHIN 5 DAYS.  LOWEST DETECTABLE LIMITS FOR URINE DRUG SCREEN Drug Class                     Cutoff (ng/mL) Amphetamine and metabolites    1000 Barbiturate and  metabolites    200 Benzodiazepine  562 Tricyclics and metabolites     300 Opiates and metabolites        300 Cocaine and metabolites        300 THC                            50 Performed at [facility], [place] [address] [place], [place] [address]   Urinalysis, Complete w Microscopic     Status: Abnormal   Collection Time: 06/16/22 12:35 AM  Result Value Ref Range   Color, Urine YELLOW YELLOW   APPearance HAZY (A) CLEAR   Specific Gravity, Urine 1.041 (H) 1.005 - 1.030   pH 5.0 5.0 - 8.0   Glucose, UA NEGATIVE NEGATIVE mg/dL   Hgb urine dipstick NEGATIVE NEGATIVE   Bilirubin Urine NEGATIVE NEGATIVE   Ketones, ur NEGATIVE NEGATIVE mg/dL   Protein, ur NEGATIVE NEGATIVE mg/dL   Nitrite NEGATIVE NEGATIVE   Leukocytes,Ua LARGE (A) NEGATIVE   RBC / HPF 0-5 0 - 5 RBC/hpf   WBC, UA >50 (H) 0 - 5 WBC/hpf   Bacteria, UA NONE SEEN NONE SEEN   Squamous Epithelial / LPF 0-5 0 - 5   Mucus PRESENT     Comment: Performed at [facility], [place] [address], [place], [place] [address]  Comprehensive metabolic panel     Status: Abnormal   Collection Time: 06/16/22  4:50 AM  Result Value Ref Range   Sodium 136 135 - 145 mmol/L   Potassium 3.7 3.5 - 5.1 mmol/L   Chloride 107 98 - 111 mmol/L   CO2 22 22 - 32 mmol/L   Glucose, Bld 86 70 - 99 mg/dL    Comment: Glucose reference range applies only to samples taken after fasting for at least 8 hours.   BUN 17 8 - 23 mg/dL   Creatinine, Ser 1.30 (H) 0.61 - 1.24 mg/dL   Calcium 8.2 (L) 8.9 - 10.3 mg/dL   Total Protein 6.6 6.5 - 8.1 g/dL   Albumin 3.1 (L) 3.5 - 5.0 g/dL   AST 42 (H) 15 - 41 U/L   ALT 56 (H) 0 - 44 U/L   Alkaline Phosphatase 36 (L) 38 - 126 U/L   Total Bilirubin 0.6 0.3 - 1.2 mg/dL   GFR, Estimated >60 >60 mL/min    Comment: (NOTE) Calculated using the CKD-EPI Creatinine Equation (2021)    Anion gap 7 5 - 15    Comment: Performed at [facility], [place]  [address], [place], [place] [address]  Magnesium     Status: None   Collection Time: 06/16/22  4:50 AM  Result Value Ref Range   Magnesium 2.2 1.7 - 2.4 mg/dL    Comment: Performed at [facility], [place] [address] [place], [place] [address]  Lipid panel     Status: Abnormal   Collection Time: 06/16/22  4:50 AM  Result Value Ref Range   Cholesterol 160 0 - 200 mg/dL   Triglycerides 159 (H) <150 mg/dL   HDL 29 (L) >40 mg/dL   Total CHOL/HDL Ratio 5.5 RATIO   VLDL 32 0 - 40 mg/dL   LDL Cholesterol 99 0 - 99 mg/dL    Comment:        Total Cholesterol/HDL:CHD Risk Coronary Heart Disease Risk Table                     Men   Women  1/2 Average Risk   3.4   3.3  Average Risk       5.0   4.4  2 X Average Risk   9.6   7.1  3 X Average Risk  23.4   11.0        Use the calculated Patient Ratio above and the CHD Risk Table to determine the patient's CHD Risk.        ATP III CLASSIFICATION (LDL):  <100     mg/dL   Optimal  100-129  mg/dL   Near or Above                    Optimal  130-159  mg/dL   Borderline  160-189  mg/dL   High  >190     mg/dL   Very High Performed at [place] [address], [place], [place] [address]   CK     Status: Abnormal   Collection Time: 06/16/22  4:50 AM  Result Value Ref Range   Total CK 920 (H) 49 - 397 U/L    Comment: Performed at [facility], [place] [address] [place], [place] [address]  Ethanol     Status: None   Collection Time: 06/16/22  5:04 AM  Result Value Ref Range   Alcohol, Ethyl (B) <10 <10 mg/dL    Comment: (NOTE) Lowest detectable limit for serum alcohol is 10 mg/dL.  For medical purposes only. Performed at [facility], [place] [address] [place], [place] [address]   TSH     Status: None   Collection Time: 06/16/22  5:04 AM  Result Value Ref Range   TSH 1.710 0.350 - 4.500 uIU/mL    Comment: Performed by a 3rd Generation assay with a functional  sensitivity of <=0.01 uIU/mL. Performed at [facility], [place] [address] [place], [place] [address]   CBC with Differential/Platelet     Status: Abnormal   Collection Time: 06/16/22  5:24 AM  Result Value Ref Range   WBC 3.5 (L) 4.0 - 10.5 K/uL   RBC 4.75 4.22 - 5.81 MIL/uL   Hemoglobin 13.7 13.0 - 17.0 g/dL   HCT 40.9 39.0 - 52.0 %   MCV 86.1 80.0 - 100.0 fL   MCH 28.8 26.0 - 34.0 pg   MCHC 33.5 30.0 - 36.0 g/dL   RDW 14.9 11.5 - 15.5 %   Platelets 112 (L) 150 - 400 K/uL   nRBC 0.0 0.0 - 0.2 %   Neutrophils Relative % 26 %   Neutro Abs 0.9 (L) 1.7 - 7.7 K/uL   Lymphocytes Relative 52 %   Lymphs Abs 1.8 0.7 - 4.0 K/uL   Monocytes Relative 15 %   Monocytes Absolute 0.5 0.1 - 1.0 K/uL   Eosinophils Relative 5 %   Eosinophils Absolute 0.2 0.0 - 0.5 K/uL   Basophils Relative 1 %   Basophils Absolute 0.0 0.0 - 0.1 K/uL   Immature Granulocytes 1 %   Abs Immature Granulocytes 0.04 0.00 - 0.07 K/uL   Reactive, Benign Lymphocytes PRESENT     Comment: Performed at [facility], [place] [address], [place], [place] [address]  Protime-INR     Status: None   Collection Time: 06/16/22  5:24 AM  Result Value Ref Range   Prothrombin Time 14.5 11.4 - 15.2 seconds   INR 1.1 0.8 - 1.2    Comment: (NOTE) INR goal varies based on device and disease states.  Performed at [facility], [place] [address], [place], [place] [address]    ECHOCARDIOGRAM COMPLETE BUBBLE STUDY  Result Date: 06/16/2022    ECHOCARDIOGRAM REPORT   Patient Name:   [name] Date of Exam: 06/16/2022 Medical Rec #:  [id number]      Height:       69.0 in Accession #:    [id number]     Weight:       227.1 lb Date of Birth:  1956/08/11      BSA:          2.181 m Patient Age:    23 years       BP:           172/106 mmHg Patient Gender: M              HR:           49 bpm. Exam Location:  Inpatient Procedure: 2D Echo and Saline Contrast Bubble Study Indications:    TIA  History:         Patient has prior history of Echocardiogram examinations, most                 recent 03/29/2019. TIA; Risk Factors:Hypertension and                 Dyslipidemia.  Sonographer:    Harvie Junior Referring Phys: [id number] [place]  1. Left ventricular ejection fraction, by estimation, is 50 to 55%. The left ventricle has low normal function. The left ventricle has no regional wall motion abnormalities. Left ventricular diastolic parameters were normal.  2. Right ventricular systolic function is normal. The right ventricular size is normal. There is normal pulmonary artery systolic pressure.  3. Left atrial size was mildly dilated.  4. The mitral valve is abnormal. Mild mitral valve regurgitation. No evidence of mitral stenosis.  5. The aortic valve is tricuspid. There is mild calcification of the aortic valve. There is mild thickening of the aortic valve. Aortic valve regurgitation is mild. Aortic valve sclerosis is present, with no evidence of aortic valve stenosis.  6. The inferior vena cava is normal in size with greater than 50% respiratory variability, suggesting right atrial pressure of 3 mmHg.  7. Agitated saline contrast bubble study was negative, with no evidence of any interatrial shunt. FINDINGS  Left Ventricle: Left ventricular ejection fraction, by estimation, is 50 to 55%. The left ventricle has low normal function. The left ventricle has no regional wall motion abnormalities. The left ventricular internal cavity size was normal in size. There is no left ventricular hypertrophy. Left ventricular diastolic parameters were normal. Right Ventricle: The right ventricular size is normal. No increase in right ventricular wall thickness. Right ventricular systolic function is normal. There is normal pulmonary artery systolic pressure. The tricuspid regurgitant velocity is 1.87 m/s, and  with an assumed right atrial pressure of 3 mmHg, the estimated right ventricular systolic pressure is  44.0 mmHg. Left Atrium: Left atrial size was mildly dilated. Right Atrium: Right atrial size was normal in size. Pericardium: There is no evidence of pericardial effusion. Mitral Valve: The mitral valve is abnormal. There is mild thickening of the mitral valve leaflet(s). Mild mitral valve regurgitation. No evidence of mitral valve stenosis. Tricuspid Valve: The tricuspid valve is normal in structure. Tricuspid valve regurgitation is trivial. No evidence of tricuspid stenosis. Aortic Valve: The aortic valve is tricuspid. There is mild calcification of the aortic valve. There is mild thickening  of the aortic valve. Aortic valve regurgitation is mild. Aortic regurgitation PHT measures 1261 msec. Aortic valve sclerosis is present, with no evidence of aortic valve stenosis. Aortic valve mean gradient measures 3.0 mmHg. Aortic valve peak gradient measures 5.9 mmHg. Aortic valve area, by VTI measures 1.95 cm. Pulmonic Valve: The pulmonic valve was normal in structure. Pulmonic valve regurgitation is trivial. No evidence of pulmonic stenosis. Aorta: The aortic root is normal in size and structure. Venous: The inferior vena cava is normal in size with greater than 50% respiratory variability, suggesting right atrial pressure of 3 mmHg. IAS/Shunts: No atrial level shunt detected by color flow Doppler. Agitated saline contrast was given intravenously to evaluate for intracardiac shunting. Agitated saline contrast bubble study was negative, with no evidence of any interatrial shunt.  LEFT VENTRICLE PLAX 2D LVIDd:         5.50 cm      Diastology LVIDs:         3.80 cm      LV e' medial:    5.35 cm/s LV PW:         1.00 cm      LV E/e' medial:  11.1 LV IVS:        1.00 cm      LV e' lateral:   6.08 cm/s LVOT diam:     2.00 cm      LV E/e' lateral: 9.8 LV SV:         54 LV SV Index:   25 LVOT Area:     3.14 cm  LV Volumes (MOD) LV vol d, MOD A2C: 103.0 ml LV vol d, MOD A4C: 121.0 ml LV vol s, MOD A2C: 45.0 ml LV vol s, MOD A4C:  66.5 ml LV SV MOD A2C:     58.0 ml LV SV MOD A4C:     121.0 ml LV SV MOD BP:      56.9 ml RIGHT VENTRICLE RV Basal diam:  3.80 cm RV Mid diam:    2.80 cm RV S prime:     14.50 cm/s TAPSE (M-mode): 1.8 cm LEFT ATRIUM             Index        RIGHT ATRIUM           Index LA diam:        3.80 cm 1.74 cm/m   RA Area:     12.00 cm LA Vol (A2C):   46.3 ml 21.23 ml/m  RA Volume:   25.30 ml  11.60 ml/m LA Vol (A4C):   38.3 ml 17.56 ml/m LA Biplane Vol: 42.9 ml 19.67 ml/m  AORTIC VALVE                    PULMONIC VALVE AV Area (Vmax):    1.53 cm     PV Vmax:          0.92 m/s AV Area (Vmean):   1.77 cm     PV Peak grad:     3.4 mmHg AV Area (VTI):     1.95 cm     PR End Diast Vel: 4.84 msec AV Vmax:           121.00 cm/s AV Vmean:          75.450 cm/s AV VTI:            0.276 m AV Peak Grad:      5.9 mmHg AV Mean Grad:  3.0 mmHg LVOT Vmax:         58.80 cm/s LVOT Vmean:        42.400 cm/s LVOT VTI:          0.171 m LVOT/AV VTI ratio: 0.62 AI PHT:            1261 msec  AORTA Ao Root diam: 3.60 cm Ao Asc diam:  3.40 cm MITRAL VALVE               TRICUSPID VALVE MV Area (PHT): 2.56 cm    TR Peak grad:   14.0 mmHg MV Decel Time: 296 msec    TR Vmax:        187.00 cm/s MR Peak grad: 32.7 mmHg MR Vmax:      286.00 cm/s  SHUNTS MV E velocity: 59.30 cm/s  Systemic VTI:  0.17 m MV A velocity: 58.80 cm/s  Systemic Diam: 2.00 cm MV E/A ratio:  1.01 Jenkins Rouge MD Electronically signed by Jenkins Rouge MD Signature Date/Time: 06/16/2022/10:24:38 AM    Final    CT ANGIO HEAD NECK W WO CM  Result Date: 06/15/2022 CLINICAL DATA:  Initial evaluation for left-sided numbness. EXAM: CT ANGIOGRAPHY HEAD AND NECK TECHNIQUE: Multidetector CT imaging of the head and neck was performed using the standard protocol during bolus administration of intravenous contrast. Multiplanar CT image reconstructions and MIPs were obtained to evaluate the vascular anatomy. Carotid stenosis measurements (when applicable) are obtained utilizing  NASCET criteria, using the distal internal carotid diameter as the denominator. RADIATION DOSE REDUCTION: This exam was performed according to the departmental dose-optimization program which includes automated exposure control, adjustment of the mA and/or kV according to patient size and/or use of iterative reconstruction technique. CONTRAST:  60m OMNIPAQUE IOHEXOL 350 MG/ML SOLN COMPARISON:  Prior brain MRI from earlier the same day. FINDINGS: CT HEAD FINDINGS Brain: Cerebral volume within normal limits for patient age. No evidence for acute intracranial hemorrhage. No findings to suggest acute large vessel territory infarct. No mass lesion, midline shift, or mass effect. Ventricles are normal in size without evidence for hydrocephalus. No extra-axial fluid collection identified. Vascular: No hyperdense vessel identified. Skull: Scalp soft tissues demonstrate no acute abnormality. Calvarium intact. Sinuses/Orbits: Globes and orbital soft tissues within normal limits. Visualized paranasal sinuses are clear. No mastoid effusion. CTA NECK FINDINGS Aortic arch: Visualized aortic arch normal in caliber with normal branch pattern. Mild atheromatous change about the arch itself. No significant stenosis about the origin of the great vessels. Right carotid system: Right common and internal carotid arteries widely patent without stenosis, dissection or occlusion. Left carotid system: Left common and internal carotid arteries widely patent without stenosis, dissection or occlusion. Vertebral arteries: Both vertebral arteries arise from the subclavian arteries. No proximal subclavian artery stenosis. Both vertebral arteries widely patent without stenosis, dissection or occlusion. Skeleton: No discrete or worrisome osseous lesions. Moderate cervical spondylosis noted. Other neck: No other acute soft tissue abnormality within the neck. Upper chest: Visualized upper chest demonstrates no acute finding. Review of the MIP images  confirms the above findings CTA HEAD FINDINGS Anterior circulation: Petrous segments patent bilaterally. Mild for age atheromatous change within the carotid siphons without significant stenosis. A1 segments patent bilaterally. Normal anterior communicating artery complex. Anterior cerebral arteries patent without stenosis. No M1 stenosis or occlusion. No proximal MCA branch occlusion. Distal MCA branches perfused and symmetric. Posterior circulation: Both V4 segments widely patent. Neither PICA origin well visualized. Basilar widely patent to its distal aspect without stenosis.  Superior cerebral arteries patent bilaterally. Both PCAs primarily supplied via the basilar. Left PCA widely patent to its distal aspect. There is a severe somewhat long segment stenosis involving the right P2 segment (series 12, image 59). This measures approximately 9 mm in length. Right PCA otherwise patent to its distal aspect. Venous sinuses: Patent allowing for timing the contrast bolus. Anatomic variants: None significant.  No aneurysm. Review of the MIP images confirms the above findings IMPRESSION: 1. Negative CTA for emergent large vessel occlusion. 2. 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 Finding could potentially contribute to left-sided numbness. 3. Otherwise wide patency of the major arterial vasculature of the head and neck. Mild atheromatous disease for age, with no other hemodynamically significant or correctable stenosis. Electronically Signed   By: Jeannine Boga M.D.   On: 06/15/2022 23:44   MR Brain Wo Contrast (neuro protocol)  Result Date: 06/15/2022 CLINICAL DATA:  Initial evaluation for acute TIA. EXAM: MRI HEAD WITHOUT CONTRAST TECHNIQUE: Multiplanar, multiecho pulse sequences of the brain and surrounding structures were obtained without intravenous contrast. COMPARISON:  CT from 12/27/2015. FINDINGS: Brain: Cerebral volume within normal limits. Minimal scattered T2/FLAIR  hyperintensity noted involving the supratentorial cerebral white matter, nonspecific, but most commonly related to chronic microvascular ischemic disease. Overall, changes are minimal in nature, and felt to be within normal limits for age. No evidence for acute or subacute ischemia. Gray-white matter differentiation maintained. No areas of chronic cortical infarction. No acute or chronic intracranial blood products. No mass lesion, midline shift or mass effect. No hydrocephalus or extra-axial fluid collection. Pituitary gland and suprasellar region normal. Midline structures intact. Vascular: Major intracranial vascular flow voids are maintained. Asymmetric FLAIR hyperintensity involving the right transverse sinus most likely related to slow/sluggish flow. Skull and upper cervical spine: Craniocervical junction within normal limits. Bone marrow signal intensity overall felt to be within normal limits. No scalp soft tissue abnormality. Sinuses/Orbits: Globes orbital soft tissues demonstrate no acute finding. Scattered mucosal thickening noted about the ethmoidal air cells and maxillary sinuses. Paranasal sinuses are otherwise clear. No mastoid effusion. Other: None. IMPRESSION: Normal brain MRI for age.  No acute intracranial abnormality. Electronically Signed   By: Jeannine Boga M.D.   On: 06/15/2022 21:47    Pending Labs Unresulted Labs (From admission, onward)     Start     Ordered   06/16/22 0521  Hemoglobin A1c  Once,   R        06/16/22 0521   06/16/22 0500  CBC with Differential/Platelet  Tomorrow morning,   R        06/15/22 2352            Vitals/Pain Today's Vitals   06/16/22 0824 06/16/22 0904 06/16/22 1100 06/16/22 1301  BP:   (!) 148/71 (!) 170/95  Pulse:   (!) 51 61  Resp:   15 18  Temp: 97.9 F (36.6 C)   98 F (36.7 C)  TempSrc: Oral   Oral  SpO2:   99% 99%  Weight:      Height:      PainSc:  5       Isolation Precautions No active  isolations  Medications Medications  acetaminophen (TYLENOL) tablet 650 mg (650 mg Oral Given 06/16/22 0903)    Or  acetaminophen (TYLENOL) suppository 650 mg ( Rectal See Alternative 06/16/22 0903)  aspirin chewable tablet 81 mg (81 mg Oral Given 06/16/22 0903)  clopidogrel (PLAVIX) tablet 75 mg (75 mg Oral Given 06/16/22  0903)  hydrALAZINE (APRESOLINE) injection 10 mg (has no administration in time range)  lactated ringers infusion (0 mLs Intravenous Stopped 06/16/22 1149)  pantoprazole (PROTONIX) EC tablet 40 mg (40 mg Oral Given 06/16/22 0903)  rosuvastatin (CRESTOR) tablet 20 mg (20 mg Oral Given 06/16/22 1155)  sodium chloride 0.9 % bolus 1,000 mL (0 mLs Intravenous Stopped 06/15/22 2056)  iohexol (OMNIPAQUE) 350 MG/ML injection 75 mL (75 mLs Intravenous Contrast Given 06/15/22 2238)  clopidogrel (PLAVIX) tablet 300 mg (300 mg Oral Given 06/15/22 2357)  aspirin tablet 650 mg (650 mg Oral Given 06/15/22 2357)   stroke: early stages of recovery book (1 each Does not apply Given 06/16/22 0125)    Mobility walks Low fall risk   Focused Assessments Neuro Assessment Handoff:  Swallow screen pass? Yes  Cardiac Rhythm: Sinus bradycardia NIH Stroke Scale ( + Modified Stroke Scale Criteria)  Interval: Initial Level of Consciousness (1a.)   : Alert, keenly responsive LOC Questions (1b. )   +: Answers both questions correctly LOC Commands (1c. )   + : Performs both tasks correctly Best Gaze (2. )  +: Normal Visual (3. )  +: No visual loss Facial Palsy (4. )    : Normal symmetrical movements Motor Arm, Left (5a. )   +: No drift Motor Arm, Right (5b. )   +: No drift Motor Leg, Left (6a. )   +: No drift Motor Leg, Right (6b. )   +: No drift Limb Ataxia (7. ): Absent Sensory (8. )   +: Mild-to-moderate sensory loss, patient feels pinprick is less sharp or is dull on the affected side, or there is a loss of superficial pain with pinprick, but patient is aware of being touched (left cheek numb/more  duller) Best Language (9. )   +: No aphasia Dysarthria (10. ): Normal Extinction/Inattention (11.)   +: No Abnormality Modified SS Total  +: 1 Complete NIHSS TOTAL: 1     Neuro Assessment:   Neuro Checks:   Initial (06/16/22 0022)  Last Documented NIHSS Modified Score: 1 (06/16/22 0022) Has TPA been given? Yes Temp: 98 F (36.7 C) (09/24 1301) Temp Source: Oral (09/24 1301) BP: 170/95 (09/24 1301) Pulse Rate: 61 (09/24 1301) If patient is a Neuro Trauma and patient is going to OR before floor call report to [place] nurse: [phone] or [phone]   R Recommendations: See Admitting Provider Note  Report given to:   Additional Notes:

## 2022-06-16 NOTE — ED Notes (Signed)
Pt appears to be sleeping, observe even RR and unlabored, NAD noted, side rails up x2 for safety, plan of care ongoing, call light within reach, no further concerns as of present.   

## 2022-06-16 NOTE — ED Notes (Signed)
Pt resting and watching TV, NAD noted, observed even RR and unlabored, side rails up x2 for safety, plan of care ongoing, pt expresses no needs or concerns at this time, call light within reach, no further concerns as of present.  

## 2022-06-16 NOTE — Consult Note (Signed)
Neurology Consultation  Reason for Consult: Stroke symptoms Referring Physician: Dr. Ernesto Rutherford     CC: Intermittent left-sided numbness  History is obtained from: Patient, chart  HPI: [name] is a 66 y.o. male past medical history of hypertension, hyperlipidemia, gout, presented to emergency room with complaints of intermittent left-sided numbness.  He reports that over the past few weeks, he has been exhibiting episodes of numbness and heaviness to involve his left face arm and leg as well as left.  He states that they lasted for few minutes-multiple times during the day.  They have become more frequent over the last few days, which made him come to the ER MRI of the brain was done which was unremarkable.  I was called by the ED provider, and I recommended that the CT angio head and neck to be done to ensure that there are no vascular stenosis given that he had a low ABCD2 score.  CT angiography head and neck was completed and reviewed.  P2 segment severe stenosis.  I recommended admission for TIA work-up given that the left-sided symptoms might be due to the stenotic symptomatic posterior circulation.   LKW: Sometime this morning, currently symptoms resolved IV thrombolysis given?: no, asymptomatic Premorbid modified Rankin scale (mRS): 0  ROS: Full ROS was performed and is negative except as noted in the HPI.   Past Medical History:  Diagnosis Date   Epididymitis    GERD (gastroesophageal reflux disease)    Gout of big toe 05/21/2013   Hard of hearing    History of gout    Hx of adenomatous polyp of colon 02/01/2017   Hyperlipidemia    Hypertension    Orchitis    Sepsis ([place]) hospitalized 12/25/2015   Urethral stricture      Family History  Problem Relation Age of Onset   Diabetes Mellitus II Paternal Grandfather    Throat cancer Sister    Other Brother        Muscle cramps   Colon cancer Neg Hx    Esophageal cancer Neg Hx    Pancreatic cancer Neg Hx    Prostate  cancer Neg Hx    Rectal cancer Neg Hx    Stomach cancer Neg Hx    Colon polyps Neg Hx      Social History:   reports that he has never smoked. He has never used smokeless tobacco. He reports current alcohol use of about 6.0 standard drinks of alcohol per week. He reports that he does not use drugs.  Medications  Current Facility-Administered Medications:    acetaminophen (TYLENOL) tablet 650 mg, 650 mg, Oral, Q6H PRN **OR** acetaminophen (TYLENOL) suppository 650 mg, 650 mg, Rectal, Q6H PRN, Howerter, Justin B, DO   aspirin chewable tablet 81 mg, 81 mg, Oral, Daily, Howerter, Justin B, DO   clopidogrel (PLAVIX) tablet 75 mg, 75 mg, Oral, Daily, Howerter, Justin B, DO   hydrALAZINE (APRESOLINE) injection 10 mg, 10 mg, Intravenous, Q4H PRN, Howerter, Justin B, DO   lactated ringers infusion, , Intravenous, Continuous, Howerter, Justin B, DO, Last Rate: 100 mL/hr at 06/16/22 0124, New Bag at 06/16/22 0124   pantoprazole (PROTONIX) EC tablet 40 mg, 40 mg, Oral, Daily, Howerter, Justin B, DO  Current Outpatient Medications:    allopurinol (ZYLOPRIM) 100 MG tablet, TAKE 1 TABLET(100 MG) BY MOUTH DAILY, Disp: 90 tablet, Rfl: 0   Multiple Vitamins-Minerals (MENS 50+ MULTI VITAMIN/MIN PO), Take 1 tablet by mouth 2 (two) times a week., Disp: , Rfl:  pantoprazole (PROTONIX) 40 MG tablet, Take 1 tablet (40 mg total) by mouth daily. (Patient taking differently: Take 40 mg by mouth daily as needed (heartburn).), Disp: 90 tablet, Rfl: 1   valsartan (DIOVAN) 320 MG tablet, Take 320 mg by mouth daily., Disp: , Rfl:    amLODipine (NORVASC) 5 MG tablet, TAKE 1 TABLET(5 MG) BY MOUTH DAILY (Patient not taking: Reported on 06/16/2022), Disp: 90 tablet, Rfl: 0   cephALEXin (KEFLEX) 500 MG capsule, Take 1 capsule (500 mg total) by mouth 3 (three) times daily. (Patient not taking: Reported on 06/16/2022), Disp: 21 capsule, Rfl: 0   colchicine (COLCRYS) 0.6 MG tablet, Take 1 tablet (0.6 mg total) by mouth 2 (two)  times daily. (Patient not taking: Reported on 06/16/2022), Disp: 20 tablet, Rfl: 2   colchicine 0.6 MG tablet, Take 1 tablet (0.6 mg total) by mouth daily. (Patient not taking: Reported on 06/16/2022), Disp: 10 tablet, Rfl: 0   diclofenac sodium (VOLTAREN) 1 % GEL, Apply 2 g topically 4 (four) times daily. (Patient not taking: Reported on 06/16/2022), Disp: 100 g, Rfl: 2   diclofenac Sodium (VOLTAREN) 1 % GEL, Apply 4 g topically 4 (four) times daily. (Patient not taking: Reported on 06/16/2022), Disp: 100 g, Rfl: 1   HYDROcodone-acetaminophen (NORCO/VICODIN) 5-325 MG tablet, Take 1 tablet by mouth every 6 (six) hours as needed. (Patient not taking: Reported on 06/16/2022), Disp: 10 tablet, Rfl: 0   meloxicam (MOBIC) 15 MG tablet, Take 1 tablet (15 mg total) by mouth daily as needed for pain. (Patient not taking: Reported on 06/16/2022), Disp: 14 tablet, Rfl: 0   meloxicam (MOBIC) 7.5 MG tablet, Take 1 tablet (7.5 mg total) by mouth daily as needed for pain. (Patient not taking: Reported on 06/16/2022), Disp: 10 tablet, Rfl: 0   promethazine (PHENERGAN) 25 MG tablet, Take 1 tablet (25 mg total) by mouth every 8 (eight) hours as needed for nausea or vomiting. (Patient not taking: Reported on 06/16/2022), Disp: 20 tablet, Rfl: 0   sulfamethoxazole-trimethoprim (BACTRIM DS) 800-160 MG tablet, Take 1 tablet by mouth 2 (two) times daily. (Patient not taking: Reported on 06/16/2022), Disp: 14 tablet, Rfl: 0   Exam: Current vital signs: BP (!) 147/88   Pulse (!) 59   Temp 98.2 F (36.8 C) (Oral)   Resp 14   Ht '5\' 9"'$  (1.753 m)   Wt 103 kg   SpO2 100%   BMI 33.53 kg/m  Vital signs in last 24 hours: Temp:  [98 F (36.7 C)-98.5 F (36.9 C)] 98.2 F (36.8 C) (09/24 0117) Pulse Rate:  [50-75] 59 (09/24 0300) Resp:  [12-21] 14 (09/24 0300) BP: (128-189)/(70-109) 147/88 (09/24 0300) SpO2:  [96 %-100 %] 100 % (09/24 0300) Weight:  [756 kg] 103 kg (09/23 1742) Patient was seen at [facility] long  hospital-emergency room bed 13. General: He was sleeping, awakens to voice, participates with the exam and follows commands. HEENT: Normocephalic atraumatic Lungs: Clear Cardiovascular: Regular rhythm Abdomen nondistended nontender Extremities warm well perfused Neurologic exam Sleeping in bed, awakens to voice, participates in the exam Hard of hearing Speech mildly dysarthric-says this is his baseline. No evidence of aphasia Cranial nerves: Pupils equal round react light, extract movements intact, visual fields full, face sensation intact, face symmetric, auditory acuity diminished bilaterally, shoulder shrug intact, tongue and palate midline. Motor examination with no drift in any of the 4 extremities at this time Sensation intact to touch without extinction Coordination with no dysmetria NIH stroke scale-1 for dysarthria  Labs I  have reviewed labs in epic and the results pertinent to this consultation are: CBC    Component Value Date/Time   WBC 3.8 (L) 06/15/2022 1801   RBC 5.24 06/15/2022 1801   HGB 15.1 06/15/2022 1801   HCT 45.0 06/15/2022 1801   PLT 141 (L) 06/15/2022 1801   MCV 85.9 06/15/2022 1801   MCH 28.8 06/15/2022 1801   MCHC 33.6 06/15/2022 1801   RDW 15.1 06/15/2022 1801   LYMPHSABS 1.3 06/15/2022 1801   MONOABS 0.6 06/15/2022 1801   EOSABS 0.1 06/15/2022 1801   BASOSABS 0.0 06/15/2022 1801    CMP     Component Value Date/Time   NA 136 06/15/2022 1801   K 4.1 06/15/2022 1801   CL 108 06/15/2022 1801   CO2 21 (L) 06/15/2022 1801   GLUCOSE 100 (H) 06/15/2022 1801   BUN 17 06/15/2022 1801   CREATININE 1.35 (H) 06/15/2022 1801   CREATININE 1.26 10/22/2021 0919   CALCIUM 8.7 (L) 06/15/2022 1801   PROT 8.0 06/15/2022 1801   ALBUMIN 3.9 06/15/2022 1801   AST 58 (H) 06/15/2022 1801   ALT 70 (H) 06/15/2022 1801   ALKPHOS 44 06/15/2022 1801   BILITOT 0.7 06/15/2022 1801   GFRNONAA 58 (L) 06/15/2022 1801   GFRAA >60 03/23/2020 1820   Imaging I have  reviewed the images obtained: CT angiography head and neck, although negative for emergent LVO, there is 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 MRI examination of the brain: No acute stroke  Assessment:  66 year old with above past medical history presenting for intermittent episodes of left-sided numbness that lasts for a few minutes and involve the face arm and leg.  MRI of the brain negative for acute stroke. CT angiography head and neck, did not reveal any emergent LVO but showed a long segment right P2 stenosis which could be causing interruption to the thalamic blood flow on the right side and left-sided sensory deficits as a result. Symptoms akin to TIA.  Recommendations: Admit to hospitalist Frequent neurochecks Telemetry Load with aspirin and Plavix and continue aspirin 81 and Plavix 75 for 3 months.  After 3 months, aspirin only. High intensity statin-atorvastatin 80 now and daily 2D echo A1c Lipid panel PT OT Speech therapy As he did have symptoms yesterday, allow for permissive hypertension for another day or so and then start normalizing blood pressure with a goal blood pressure of normotension on discharge.  Avoid hypotension.  Stroke team to follow.  Plan discussed with Dr. Ernesto Rutherford on the phone and after seeing the patient, recommendations relayed to the admitting hospitalist Dr. Velia Meyer  He is awaiting transfer to [facility].  -- Amie Portland, MD Neurologist [facility] Pager: [phone]

## 2022-06-16 NOTE — ED Notes (Signed)
Lab request redraw of labs

## 2022-06-16 NOTE — Progress Notes (Addendum)
STROKE TEAM PROGRESS NOTE   INTERVAL HISTORY [name] states that his sensation and strength are back to baseline but the episodes of numbness have been happening for the last month and a half at increasing frequencies and lasting for about 2-3 minutes. He had 3 episodes yesterday, 2 of which happened in the ED. He does have an appt with his PCP in late October and wants to ensure he has enough medications to last until then.  He denies any triggers for these episodes or relieving factors.  Episodes involve the left lips and inside of the mouth as well as left arm and lower extremity and at times involve the entire left side of times only the tips of the fingers or toes.  LDL- 99- Recommend full dose statin- crestor '20mg'$ .  It appears he has tried Lipitor in the past and had myalgias Aspirin '81mg'$  and plavix '75mg'$  for 3 months and then aspirin '81mg'$  alone Follow up with [facility] outpatient in 8 weeks  Vitals:   06/16/22 1100 06/16/22 1301 06/16/22 1500 06/16/22 1650  BP: (!) 148/71 (!) 170/95 (!) 162/99 (!) 162/98  Pulse: (!) 51 61 70 (!) 58  Resp: 15 18 (!) 22 (!) 21  Temp:  98 F (36.7 C)  98.8 F (37.1 C)  TempSrc:  Oral  Oral  SpO2: 99% 99% 99% 99%  Weight:      Height:       CBC:  Recent Labs  Lab 06/15/22 1801 06/16/22 0524  WBC 3.8* 3.5*  NEUTROABS 1.7 0.9*  HGB 15.1 13.7  HCT 45.0 40.9  MCV 85.9 86.1  PLT 141* 132*   Basic Metabolic Panel:  Recent Labs  Lab 06/15/22 1801 06/16/22 0450  NA 136 136  K 4.1 3.7  CL 108 107  CO2 21* 22  GLUCOSE 100* 86  BUN 17 17  CREATININE 1.35* 1.30*  CALCIUM 8.7* 8.2*  MG 2.1 2.2   Lipid Panel:  Recent Labs  Lab 06/16/22 0450  CHOL 160  TRIG 159*  HDL 29*  CHOLHDL 5.5  VLDL 32  LDLCALC 99   HgbA1c: No results for input(s): "HGBA1C" in the last 168 hours. Urine Drug Screen:  Recent Labs  Lab 06/16/22 0035  LABOPIA NONE DETECTED  COCAINSCRNUR NONE DETECTED  LABBENZ NONE DETECTED  AMPHETMU NONE DETECTED  THCU NONE  DETECTED  LABBARB NONE DETECTED    Alcohol Level  Recent Labs  Lab 06/16/22 0504  ETH <10    IMAGING past 24 hours ECHOCARDIOGRAM COMPLETE BUBBLE STUDY  Result Date: 06/16/2022    ECHOCARDIOGRAM REPORT   Patient Name:   [name] Date of Exam: 06/16/2022 Medical Rec #:  [id number]      Height:       69.0 in Accession #:    [id number]     Weight:       227.1 lb Date of Birth:  March 30, 1956      BSA:          2.181 m Patient Age:    87 years       BP:           172/106 mmHg Patient Gender: M              HR:           49 bpm. Exam Location:  Inpatient Procedure: 2D Echo and Saline Contrast Bubble Study Indications:    TIA  History:        Patient has prior history  of Echocardiogram examinations, most                 recent 03/29/2019. TIA; Risk Factors:Hypertension and                 Dyslipidemia.  Sonographer:    Harvie Junior Referring Phys: [id number] [place]  1. Left ventricular ejection fraction, by estimation, is 50 to 55%. The left ventricle has low normal function. The left ventricle has no regional wall motion abnormalities. Left ventricular diastolic parameters were normal.  2. Right ventricular systolic function is normal. The right ventricular size is normal. There is normal pulmonary artery systolic pressure.  3. Left atrial size was mildly dilated.  4. The mitral valve is abnormal. Mild mitral valve regurgitation. No evidence of mitral stenosis.  5. The aortic valve is tricuspid. There is mild calcification of the aortic valve. There is mild thickening of the aortic valve. Aortic valve regurgitation is mild. Aortic valve sclerosis is present, with no evidence of aortic valve stenosis.  6. The inferior vena cava is normal in size with greater than 50% respiratory variability, suggesting right atrial pressure of 3 mmHg.  7. Agitated saline contrast bubble study was negative, with no evidence of any interatrial shunt. FINDINGS  Left Ventricle: Left ventricular ejection  fraction, by estimation, is 50 to 55%. The left ventricle has low normal function. The left ventricle has no regional wall motion abnormalities. The left ventricular internal cavity size was normal in size. There is no left ventricular hypertrophy. Left ventricular diastolic parameters were normal. Right Ventricle: The right ventricular size is normal. No increase in right ventricular wall thickness. Right ventricular systolic function is normal. There is normal pulmonary artery systolic pressure. The tricuspid regurgitant velocity is 1.87 m/s, and  with an assumed right atrial pressure of 3 mmHg, the estimated right ventricular systolic pressure is 14.7 mmHg. Left Atrium: Left atrial size was mildly dilated. Right Atrium: Right atrial size was normal in size. Pericardium: There is no evidence of pericardial effusion. Mitral Valve: The mitral valve is abnormal. There is mild thickening of the mitral valve leaflet(s). Mild mitral valve regurgitation. No evidence of mitral valve stenosis. Tricuspid Valve: The tricuspid valve is normal in structure. Tricuspid valve regurgitation is trivial. No evidence of tricuspid stenosis. Aortic Valve: The aortic valve is tricuspid. There is mild calcification of the aortic valve. There is mild thickening of the aortic valve. Aortic valve regurgitation is mild. Aortic regurgitation PHT measures 1261 msec. Aortic valve sclerosis is present, with no evidence of aortic valve stenosis. Aortic valve mean gradient measures 3.0 mmHg. Aortic valve peak gradient measures 5.9 mmHg. Aortic valve area, by VTI measures 1.95 cm. Pulmonic Valve: The pulmonic valve was normal in structure. Pulmonic valve regurgitation is trivial. No evidence of pulmonic stenosis. Aorta: The aortic root is normal in size and structure. Venous: The inferior vena cava is normal in size with greater than 50% respiratory variability, suggesting right atrial pressure of 3 mmHg. IAS/Shunts: No atrial level shunt detected  by color flow Doppler. Agitated saline contrast was given intravenously to evaluate for intracardiac shunting. Agitated saline contrast bubble study was negative, with no evidence of any interatrial shunt.  LEFT VENTRICLE PLAX 2D LVIDd:         5.50 cm      Diastology LVIDs:         3.80 cm      LV e' medial:    5.35 cm/s LV PW:  1.00 cm      LV E/e' medial:  11.1 LV IVS:        1.00 cm      LV e' lateral:   6.08 cm/s LVOT diam:     2.00 cm      LV E/e' lateral: 9.8 LV SV:         54 LV SV Index:   25 LVOT Area:     3.14 cm  LV Volumes (MOD) LV vol d, MOD A2C: 103.0 ml LV vol d, MOD A4C: 121.0 ml LV vol s, MOD A2C: 45.0 ml LV vol s, MOD A4C: 66.5 ml LV SV MOD A2C:     58.0 ml LV SV MOD A4C:     121.0 ml LV SV MOD BP:      56.9 ml RIGHT VENTRICLE RV Basal diam:  3.80 cm RV Mid diam:    2.80 cm RV S prime:     14.50 cm/s TAPSE (M-mode): 1.8 cm LEFT ATRIUM             Index        RIGHT ATRIUM           Index LA diam:        3.80 cm 1.74 cm/m   RA Area:     12.00 cm LA Vol (A2C):   46.3 ml 21.23 ml/m  RA Volume:   25.30 ml  11.60 ml/m LA Vol (A4C):   38.3 ml 17.56 ml/m LA Biplane Vol: 42.9 ml 19.67 ml/m  AORTIC VALVE                    PULMONIC VALVE AV Area (Vmax):    1.53 cm     PV Vmax:          0.92 m/s AV Area (Vmean):   1.77 cm     PV Peak grad:     3.4 mmHg AV Area (VTI):     1.95 cm     PR End Diast Vel: 4.84 msec AV Vmax:           121.00 cm/s AV Vmean:          75.450 cm/s AV VTI:            0.276 m AV Peak Grad:      5.9 mmHg AV Mean Grad:      3.0 mmHg LVOT Vmax:         58.80 cm/s LVOT Vmean:        42.400 cm/s LVOT VTI:          0.171 m LVOT/AV VTI ratio: 0.62 AI PHT:            1261 msec  AORTA Ao Root diam: 3.60 cm Ao Asc diam:  3.40 cm MITRAL VALVE               TRICUSPID VALVE MV Area (PHT): 2.56 cm    TR Peak grad:   14.0 mmHg MV Decel Time: 296 msec    TR Vmax:        187.00 cm/s MR Peak grad: 32.7 mmHg MR Vmax:      286.00 cm/s  SHUNTS MV E velocity: 59.30 cm/s  Systemic VTI:   0.17 m MV A velocity: 58.80 cm/s  Systemic Diam: 2.00 cm MV E/A ratio:  1.01 Jenkins Rouge MD Electronically signed by Jenkins Rouge MD Signature Date/Time: 06/16/2022/10:24:38 AM    Final    CT ANGIO HEAD NECK W  WO CM  Result Date: 06/15/2022 CLINICAL DATA:  Initial evaluation for left-sided numbness. EXAM: CT ANGIOGRAPHY HEAD AND NECK TECHNIQUE: Multidetector CT imaging of the head and neck was performed using the standard protocol during bolus administration of intravenous contrast. Multiplanar CT image reconstructions and MIPs were obtained to evaluate the vascular anatomy. Carotid stenosis measurements (when applicable) are obtained utilizing NASCET criteria, using the distal internal carotid diameter as the denominator. RADIATION DOSE REDUCTION: This exam was performed according to the departmental dose-optimization program which includes automated exposure control, adjustment of the mA and/or kV according to patient size and/or use of iterative reconstruction technique. CONTRAST:  62m OMNIPAQUE IOHEXOL 350 MG/ML SOLN COMPARISON:  Prior brain MRI from earlier the same day. FINDINGS: CT HEAD FINDINGS Brain: Cerebral volume within normal limits for patient age. No evidence for acute intracranial hemorrhage. No findings to suggest acute large vessel territory infarct. No mass lesion, midline shift, or mass effect. Ventricles are normal in size without evidence for hydrocephalus. No extra-axial fluid collection identified. Vascular: No hyperdense vessel identified. Skull: Scalp soft tissues demonstrate no acute abnormality. Calvarium intact. Sinuses/Orbits: Globes and orbital soft tissues within normal limits. Visualized paranasal sinuses are clear. No mastoid effusion. CTA NECK FINDINGS Aortic arch: Visualized aortic arch normal in caliber with normal branch pattern. Mild atheromatous change about the arch itself. No significant stenosis about the origin of the great vessels. Right carotid system: Right common  and internal carotid arteries widely patent without stenosis, dissection or occlusion. Left carotid system: Left common and internal carotid arteries widely patent without stenosis, dissection or occlusion. Vertebral arteries: Both vertebral arteries arise from the subclavian arteries. No proximal subclavian artery stenosis. Both vertebral arteries widely patent without stenosis, dissection or occlusion. Skeleton: No discrete or worrisome osseous lesions. Moderate cervical spondylosis noted. Other neck: No other acute soft tissue abnormality within the neck. Upper chest: Visualized upper chest demonstrates no acute finding. Review of the MIP images confirms the above findings CTA HEAD FINDINGS Anterior circulation: Petrous segments patent bilaterally. Mild for age atheromatous change within the carotid siphons without significant stenosis. A1 segments patent bilaterally. Normal anterior communicating artery complex. Anterior cerebral arteries patent without stenosis. No M1 stenosis or occlusion. No proximal MCA branch occlusion. Distal MCA branches perfused and symmetric. Posterior circulation: Both V4 segments widely patent. Neither PICA origin well visualized. Basilar widely patent to its distal aspect without stenosis. Superior cerebral arteries patent bilaterally. Both PCAs primarily supplied via the basilar. Left PCA widely patent to its distal aspect. There is a severe somewhat long segment stenosis involving the right P2 segment (series 12, image 59). This measures approximately 9 mm in length. Right PCA otherwise patent to its distal aspect. Venous sinuses: Patent allowing for timing the contrast bolus. Anatomic variants: None significant.  No aneurysm. Review of the MIP images confirms the above findings IMPRESSION: 1. Negative CTA for emergent large vessel occlusion. 2. 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 Finding could potentially contribute to left-sided numbness. 3. Otherwise  wide patency of the major arterial vasculature of the head and neck. Mild atheromatous disease for age, with no other hemodynamically significant or correctable stenosis. Electronically Signed   By: BJeannine BogaM.D.   On: 06/15/2022 23:44   MR Brain Wo Contrast (neuro protocol)  Result Date: 06/15/2022 CLINICAL DATA:  Initial evaluation for acute TIA. EXAM: MRI HEAD WITHOUT CONTRAST TECHNIQUE: Multiplanar, multiecho pulse sequences of the brain and surrounding structures were obtained without intravenous contrast. COMPARISON:  CT from  12/27/2015. FINDINGS: Brain: Cerebral volume within normal limits. Minimal scattered T2/FLAIR hyperintensity noted involving the supratentorial cerebral white matter, nonspecific, but most commonly related to chronic microvascular ischemic disease. Overall, changes are minimal in nature, and felt to be within normal limits for age. No evidence for acute or subacute ischemia. Gray-white matter differentiation maintained. No areas of chronic cortical infarction. No acute or chronic intracranial blood products. No mass lesion, midline shift or mass effect. No hydrocephalus or extra-axial fluid collection. Pituitary gland and suprasellar region normal. Midline structures intact. Vascular: Major intracranial vascular flow voids are maintained. Asymmetric FLAIR hyperintensity involving the right transverse sinus most likely related to slow/sluggish flow. Skull and upper cervical spine: Craniocervical junction within normal limits. Bone marrow signal intensity overall felt to be within normal limits. No scalp soft tissue abnormality. Sinuses/Orbits: Globes orbital soft tissues demonstrate no acute finding. Scattered mucosal thickening noted about the ethmoidal air cells and maxillary sinuses. Paranasal sinuses are otherwise clear. No mastoid effusion. Other: None. IMPRESSION: Normal brain MRI for age.  No acute intracranial abnormality. Electronically Signed   By: Jeannine Boga M.D.   On: 06/15/2022 21:47    PHYSICAL EXAM  Constitutional: Appears mildly obese middle-age African-American male not in distress..   Cardiovascular: Normal rate and regular rhythm.  Respiratory: Effort normal, non-labored breathing  Neuro: Mental Status: Patient is awake, alert, oriented to person, place, month, year, and situation. Patient is able to give a clear and coherent history. No signs of aphasia or neglect Cranial Nerves: II: Visual Fields are full. Pupils are equal, round, and reactive to light.   III,IV, VI: EOMI without ptosis or diploplia.  V: Facial sensation is symmetric to temperature VII: Facial movement is symmetric resting and smiling VIII: Hearing is intact to voice X: Palate elevates symmetrically XI: Shoulder shrug is symmetric. XII: Tongue protrudes midline without atrophy or fasciculations.  Motor: Tone is normal. Bulk is normal. 5/5 strength was present in all four extremities.  Sensory: Sensation is symmetric to light touch and temperature in the arms and legs. No extinction to DSS present.  Deep Tendon Reflexes: 2+ and symmetric in the biceps and patellae.  Plantars: Toes are downgoing bilaterally.  Cerebellar: FNF and HKS are intact bilaterally   NIHSS 0     ASSESSMENT/PLAN [name] is a 66 y.o. male with history of hypertension, hyperlipidemia, gout, presented to emergency room with complaints of intermittent left-sided numbness lasting about 2 minutes. These episodes have been happening for the last month and half when he is doing physical activity (ie. Mowing the lawn, walking for exercise).   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  Echo Complete with Bubble Study- Left ventricular  ejection fraction 50 to 55%.  Agitated saline contrast bubble study was negative, with no evidence of any interatrial shunt.  LDL 99 HgbA1c No results found for requested labs within last 1095 days. VTE prophylaxis - SCDs    Diet   Diet Heart Room service appropriate? Yes; Fluid consistency: Thin   No antithrombotic prior to admission, now on aspirin 81 mg daily and clopidogrel 75 mg daily for 3 months and then ASA '81mg'$  alone Therapy recommendations:  None Disposition:  None  Hypertension Home meds:  Amlodipine '5mg'$  daily Stable Permissive hypertension (OK if < 220/120)  but gradually normalize in 5-7 days Long-term BP goal normotensive  Hyperlipidemia Home meds:  None LDL 99, goal < 70 Add crestor '20mg'$   Continue statin at discharge   Other Stroke Risk Factors Advanced Age >/= 4   Other Active Problems Gout- resume colchicine and allopurinol '100mg'$    Hospital day # 0  Patient seen and examined by NP/APP with MD. MD to update note as needed.   Janine Ores, DNP, FNP-BC [facility] Pager: [phone]  STROKE MD NOTE : I have personally obtained history,examined this patient, reviewed notes, independently viewed imaging studies, participated in medical decision making and plan of care.ROS completed by me personally and pertinent positives fully documented  I have made any additions or clarifications directly to the above note. Agree with note above.  Patient presented with recurrent transient episodes of Kind[place] oral paresthesias lasting 2 to 3 minutes with multiple episodes in the last 1 month.  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  Recommend dual antiplatelet therapy of aspirin and Plavix for 3 months followed by aspirin alone and aggressive risk factor modification.  Crestor for elevated lipids.  Patient may also consider outpatient evaluation for sleep  apnea.  Long discussion with the patient about STIs and discussion about TIA and stroke risk, prevention and treatment and answered questions.  Discussed with Dr. Eliseo Squires.  Greater than 50% time during this 50-minute visit was spent in counseling and coordination of care about his paresthesias and TIA and discussion about evaluation and treatment and prevention of questions. Follow-up as an outpatient in stroke clinic in 2 months. Antony Contras, MD Medical Director [facility] Pager: [phone] 06/16/2022 5:32 PM   To contact Stroke Continuity provider, please refer to [email or web address] After hours, contact General Neurology

## 2022-06-16 NOTE — Progress Notes (Signed)
Patient was upset because he was told by neuro physician team that he could be discharged and no orders had been placed for such. Patient was threatening to leave AMA, reached out to provider and discharge orders obtained. Review discharge instructions, medications, follow up with patient, and provided hard copy to patient. All patient belongings with patient at discharge. Patient stable at discharge with no indications of distress. He did not want to have his vital signs rechecked prior to discharge. Discussed with with to reach out to resources to obtain a PCP if he does not have one. Discussed stroke signs and symptoms and reasons to seek emergency care. Patient ambulatory and alert/oriented. He did not want to wait to be transported out of the hospital and wished to walk himself despite offering him assistance.

## 2022-06-16 NOTE — Discharge Summary (Addendum)
Physician Discharge Summary  [name] IPJ:[id number] DOB: December 12, 1955 DOA: 06/15/2022  PCP: Mancel Bale, PA-C  Admit date: 06/15/2022 Discharge date: 06/16/2022  Admitted From: home Discharge disposition: home   Recommendations for Outpatient Follow-Up:   Follow up results of HgbA1c CK and LFTs at next office visit Outpatient sleep study ASA/plavix x 3 months then ASA alone   Discharge Diagnosis:   Principal Problem:   TIA (transient ischemic attack) Active Problems:   Hypertension   Elevated CPK   Transaminitis   Hyperlipidemia   GERD (gastroesophageal reflux disease)    Discharge Condition: Improved.  Diet recommendation: Low sodium, heart healthy.  Wound care: None.  Code status: Full.   History of Present Illness:   BAYARD MORE is a 66 y.o. male with medical history significant for essential hypertension, hyperlipidemia, chronic mild elevation of CPK, who is admitted to [facility] on 06/15/2022 for further TIA work-up after presenting from home to [facility] ED complaining of intermittent left-sided numbness.    Over the course the last 1 month, the patient reports that he has been experiencing intermittent episodes of left-sided numbness in a distribution that involves the left upper and lower extremity as well as the left portion of his face, including the left origin of his lips, without the numbness crossing over midline.  He states that each of these episodes have been self-limited, typically lasting only a few seconds to a few minutes.  Frequency of these episodes is very will, with the patient symptoms experiencing multiple episodes per day, and at other times, experiencing an episode every few days.  Denies any escalation in the frequency over the last 4 weeks.  Occasionally, these episodes of left-sided numbness are also associated with mild self-limited weakness involving the left upper and lower extremity.  He has never experienced a component of  weakness that has been accompanied by aforementioned numbness in similar distribution.  Most recent episode occurred while and was in the emergency department this evening, around 2300 on 06/16/22, resolved within a minute of onset, without any ensuing additional episodes, without any residual numbness, weakness.  He reports that these episodes have not been associated with any dysphagia, dizziness, vertigo, nausea, vomiting, change in vision, blurry vision, diplopia, word finding difficulties, slurring of speech, facial droop, or headache.       Hospital Course by Problem:   #) TIA's: Patient presented with recurrent transient episodes of Kindl oral paresthesias lasting 2 to 3 minutes with multiple episodes in the last 1 month.  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  Recommend dual antiplatelet therapy of aspirin and Plavix for 3 months followed by aspirin alone and aggressive risk factor modification.  Crestor for elevated lipids.  Patient may also consider outpatient evaluation for sleep apnea. Load with aspirin and Plavix and continue aspirin 81 and Plavix 75 for 3 months.  After 3 months, aspirin only. -statin A1c pending Lipid panel- see below PT/OT/Speech therapy- no follow up       Chronically elevated CPK level: Dating back to June 2020, has further modified above, with presenting CPK of 930 noted to be slightly out of his chronic baseline, all in the setting of patient's reported chronic diffuse myalgias without any noted recent trauma, and in the absence of recent statin use, as confirmed by patient.       Transaminitis: Mildly elevated AST/ALT without  laboratory evidence of responding cholestatic process.  Concomitant with mildly elevated CPK level relative to chronic baseline range, as above.  Of note, CBC reflects mild leukopenia and mild thrombocytopenia.  -outpatient  work up     Essential Hypertension: -resume home meds     Hyperlipidemia: documented h/o such.  Not on any antilipid medications at this time, including no statin in the setting of chronic myalgias and chronically elevated CPK level, as above.  LDL: 99- neurology: crestor        GERD:  -continue home PPI.    Obesity Estimated body mass index is 33.53 kg/m as calculated from the following:   Height as of this encounter: '5\' 9"'$  (1.753 m).   Weight as of this encounter: 103 kg.     Medical Consultants:   neuro   Discharge Exam:   Vitals:   06/16/22 1500 06/16/22 1650  BP: (!) 162/99 (!) 162/98  Pulse: 70 (!) 58  Resp: (!) 22 (!) 21  Temp:  98.8 F (37.1 C)  SpO2: 99% 99%   Vitals:   06/16/22 1100 06/16/22 1301 06/16/22 1500 06/16/22 1650  BP: (!) 148/71 (!) 170/95 (!) 162/99 (!) 162/98  Pulse: (!) 51 61 70 (!) 58  Resp: 15 18 (!) 22 (!) 21  Temp:  98 F (36.7 C)  98.8 F (37.1 C)  TempSrc:  Oral  Oral  SpO2: 99% 99% 99% 99%  Weight:      Height:        General exam: Appears calm and comfortable.    The results of significant diagnostics from this hospitalization (including imaging, microbiology, ancillary and laboratory) are listed below for reference.     Procedures and Diagnostic Studies:   ECHOCARDIOGRAM COMPLETE BUBBLE STUDY  Result Date: 06/16/2022    ECHOCARDIOGRAM REPORT   Patient Name:   [name] Date of Exam: 06/16/2022 Medical Rec #:  [id number]      Height:       69.0 in Accession #:    [id number]     Weight:       227.1 lb Date of Birth:  1956-03-31      BSA:          2.181 m Patient Age:    17 years       BP:           172/106 mmHg Patient Gender: M              HR:           49 bpm. Exam Location:  Inpatient Procedure: 2D Echo and Saline Contrast Bubble Study Indications:    TIA  History:        Patient has prior history of Echocardiogram examinations, most                 recent 03/29/2019. TIA; Risk Factors:Hypertension and                  Dyslipidemia.  Sonographer:    Harvie Junior Referring Phys: [id number] [place]  1. Left ventricular ejection fraction, by estimation, is 50 to 55%. The left ventricle has low normal function. The left ventricle has no regional wall motion abnormalities. Left ventricular diastolic parameters were normal.  2. Right ventricular systolic function is normal. The right ventricular size is normal. There is normal pulmonary artery systolic pressure.  3. Left atrial size was mildly dilated.  4. The mitral valve is abnormal. Mild mitral valve regurgitation. No  evidence of mitral stenosis.  5. The aortic valve is tricuspid. There is mild calcification of the aortic valve. There is mild thickening of the aortic valve. Aortic valve regurgitation is mild. Aortic valve sclerosis is present, with no evidence of aortic valve stenosis.  6. The inferior vena cava is normal in size with greater than 50% respiratory variability, suggesting right atrial pressure of 3 mmHg.  7. Agitated saline contrast bubble study was negative, with no evidence of any interatrial shunt. FINDINGS  Left Ventricle: Left ventricular ejection fraction, by estimation, is 50 to 55%. The left ventricle has low normal function. The left ventricle has no regional wall motion abnormalities. The left ventricular internal cavity size was normal in size. There is no left ventricular hypertrophy. Left ventricular diastolic parameters were normal. Right Ventricle: The right ventricular size is normal. No increase in right ventricular wall thickness. Right ventricular systolic function is normal. There is normal pulmonary artery systolic pressure. The tricuspid regurgitant velocity is 1.87 m/s, and  with an assumed right atrial pressure of 3 mmHg, the estimated right ventricular systolic pressure is 03.2 mmHg. Left Atrium: Left atrial size was mildly dilated. Right Atrium: Right atrial size was normal in size. Pericardium: There is no evidence of  pericardial effusion. Mitral Valve: The mitral valve is abnormal. There is mild thickening of the mitral valve leaflet(s). Mild mitral valve regurgitation. No evidence of mitral valve stenosis. Tricuspid Valve: The tricuspid valve is normal in structure. Tricuspid valve regurgitation is trivial. No evidence of tricuspid stenosis. Aortic Valve: The aortic valve is tricuspid. There is mild calcification of the aortic valve. There is mild thickening of the aortic valve. Aortic valve regurgitation is mild. Aortic regurgitation PHT measures 1261 msec. Aortic valve sclerosis is present, with no evidence of aortic valve stenosis. Aortic valve mean gradient measures 3.0 mmHg. Aortic valve peak gradient measures 5.9 mmHg. Aortic valve area, by VTI measures 1.95 cm. Pulmonic Valve: The pulmonic valve was normal in structure. Pulmonic valve regurgitation is trivial. No evidence of pulmonic stenosis. Aorta: The aortic root is normal in size and structure. Venous: The inferior vena cava is normal in size with greater than 50% respiratory variability, suggesting right atrial pressure of 3 mmHg. IAS/Shunts: No atrial level shunt detected by color flow Doppler. Agitated saline contrast was given intravenously to evaluate for intracardiac shunting. Agitated saline contrast bubble study was negative, with no evidence of any interatrial shunt.  LEFT VENTRICLE PLAX 2D LVIDd:         5.50 cm      Diastology LVIDs:         3.80 cm      LV e' medial:    5.35 cm/s LV PW:         1.00 cm      LV E/e' medial:  11.1 LV IVS:        1.00 cm      LV e' lateral:   6.08 cm/s LVOT diam:     2.00 cm      LV E/e' lateral: 9.8 LV SV:         54 LV SV Index:   25 LVOT Area:     3.14 cm  LV Volumes (MOD) LV vol d, MOD A2C: 103.0 ml LV vol d, MOD A4C: 121.0 ml LV vol s, MOD A2C: 45.0 ml LV vol s, MOD A4C: 66.5 ml LV SV MOD A2C:     58.0 ml LV SV MOD A4C:     121.0 ml  LV SV MOD BP:      56.9 ml RIGHT VENTRICLE RV Basal diam:  3.80 cm RV Mid diam:     2.80 cm RV S prime:     14.50 cm/s TAPSE (M-mode): 1.8 cm LEFT ATRIUM             Index        RIGHT ATRIUM           Index LA diam:        3.80 cm 1.74 cm/m   RA Area:     12.00 cm LA Vol (A2C):   46.3 ml 21.23 ml/m  RA Volume:   25.30 ml  11.60 ml/m LA Vol (A4C):   38.3 ml 17.56 ml/m LA Biplane Vol: 42.9 ml 19.67 ml/m  AORTIC VALVE                    PULMONIC VALVE AV Area (Vmax):    1.53 cm     PV Vmax:          0.92 m/s AV Area (Vmean):   1.77 cm     PV Peak grad:     3.4 mmHg AV Area (VTI):     1.95 cm     PR End Diast Vel: 4.84 msec AV Vmax:           121.00 cm/s AV Vmean:          75.450 cm/s AV VTI:            0.276 m AV Peak Grad:      5.9 mmHg AV Mean Grad:      3.0 mmHg LVOT Vmax:         58.80 cm/s LVOT Vmean:        42.400 cm/s LVOT VTI:          0.171 m LVOT/AV VTI ratio: 0.62 AI PHT:            1261 msec  AORTA Ao Root diam: 3.60 cm Ao Asc diam:  3.40 cm MITRAL VALVE               TRICUSPID VALVE MV Area (PHT): 2.56 cm    TR Peak grad:   14.0 mmHg MV Decel Time: 296 msec    TR Vmax:        187.00 cm/s MR Peak grad: 32.7 mmHg MR Vmax:      286.00 cm/s  SHUNTS MV E velocity: 59.30 cm/s  Systemic VTI:  0.17 m MV A velocity: 58.80 cm/s  Systemic Diam: 2.00 cm MV E/A ratio:  1.01 Jenkins Rouge MD Electronically signed by Jenkins Rouge MD Signature Date/Time: 06/16/2022/10:24:38 AM    Final    CT ANGIO HEAD NECK W WO CM  Result Date: 06/15/2022 CLINICAL DATA:  Initial evaluation for left-sided numbness. EXAM: CT ANGIOGRAPHY HEAD AND NECK TECHNIQUE: Multidetector CT imaging of the head and neck was performed using the standard protocol during bolus administration of intravenous contrast. Multiplanar CT image reconstructions and MIPs were obtained to evaluate the vascular anatomy. Carotid stenosis measurements (when applicable) are obtained utilizing NASCET criteria, using the distal internal carotid diameter as the denominator. RADIATION DOSE REDUCTION: This exam was performed according to the  departmental dose-optimization program which includes automated exposure control, adjustment of the mA and/or kV according to patient size and/or use of iterative reconstruction technique. CONTRAST:  67m OMNIPAQUE IOHEXOL 350 MG/ML SOLN COMPARISON:  Prior brain MRI from earlier the same day. FINDINGS: CT HEAD  FINDINGS Brain: Cerebral volume within normal limits for patient age. No evidence for acute intracranial hemorrhage. No findings to suggest acute large vessel territory infarct. No mass lesion, midline shift, or mass effect. Ventricles are normal in size without evidence for hydrocephalus. No extra-axial fluid collection identified. Vascular: No hyperdense vessel identified. Skull: Scalp soft tissues demonstrate no acute abnormality. Calvarium intact. Sinuses/Orbits: Globes and orbital soft tissues within normal limits. Visualized paranasal sinuses are clear. No mastoid effusion. CTA NECK FINDINGS Aortic arch: Visualized aortic arch normal in caliber with normal branch pattern. Mild atheromatous change about the arch itself. No significant stenosis about the origin of the great vessels. Right carotid system: Right common and internal carotid arteries widely patent without stenosis, dissection or occlusion. Left carotid system: Left common and internal carotid arteries widely patent without stenosis, dissection or occlusion. Vertebral arteries: Both vertebral arteries arise from the subclavian arteries. No proximal subclavian artery stenosis. Both vertebral arteries widely patent without stenosis, dissection or occlusion. Skeleton: No discrete or worrisome osseous lesions. Moderate cervical spondylosis noted. Other neck: No other acute soft tissue abnormality within the neck. Upper chest: Visualized upper chest demonstrates no acute finding. Review of the MIP images confirms the above findings CTA HEAD FINDINGS Anterior circulation: Petrous segments patent bilaterally. Mild for age atheromatous change within  the carotid siphons without significant stenosis. A1 segments patent bilaterally. Normal anterior communicating artery complex. Anterior cerebral arteries patent without stenosis. No M1 stenosis or occlusion. No proximal MCA branch occlusion. Distal MCA branches perfused and symmetric. Posterior circulation: Both V4 segments widely patent. Neither PICA origin well visualized. Basilar widely patent to its distal aspect without stenosis. Superior cerebral arteries patent bilaterally. Both PCAs primarily supplied via the basilar. Left PCA widely patent to its distal aspect. There is a severe somewhat long segment stenosis involving the right P2 segment (series 12, image 59). This measures approximately 9 mm in length. Right PCA otherwise patent to its distal aspect. Venous sinuses: Patent allowing for timing the contrast bolus. Anatomic variants: None significant.  No aneurysm. Review of the MIP images confirms the above findings IMPRESSION: 1. Negative CTA for emergent large vessel occlusion. 2. 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 Finding could potentially contribute to left-sided numbness. 3. Otherwise wide patency of the major arterial vasculature of the head and neck. Mild atheromatous disease for age, with no other hemodynamically significant or correctable stenosis. Electronically Signed   By: Jeannine Boga M.D.   On: 06/15/2022 23:44   MR Brain Wo Contrast (neuro protocol)  Result Date: 06/15/2022 CLINICAL DATA:  Initial evaluation for acute TIA. EXAM: MRI HEAD WITHOUT CONTRAST TECHNIQUE: Multiplanar, multiecho pulse sequences of the brain and surrounding structures were obtained without intravenous contrast. COMPARISON:  CT from 12/27/2015. FINDINGS: Brain: Cerebral volume within normal limits. Minimal scattered T2/FLAIR hyperintensity noted involving the supratentorial cerebral white matter, nonspecific, but most commonly related to chronic microvascular ischemic disease.  Overall, changes are minimal in nature, and felt to be within normal limits for age. No evidence for acute or subacute ischemia. Gray-white matter differentiation maintained. No areas of chronic cortical infarction. No acute or chronic intracranial blood products. No mass lesion, midline shift or mass effect. No hydrocephalus or extra-axial fluid collection. Pituitary gland and suprasellar region normal. Midline structures intact. Vascular: Major intracranial vascular flow voids are maintained. Asymmetric FLAIR hyperintensity involving the right transverse sinus most likely related to slow/sluggish flow. Skull and upper cervical spine: Craniocervical junction within normal limits. Bone marrow signal intensity overall  felt to be within normal limits. No scalp soft tissue abnormality. Sinuses/Orbits: Globes orbital soft tissues demonstrate no acute finding. Scattered mucosal thickening noted about the ethmoidal air cells and maxillary sinuses. Paranasal sinuses are otherwise clear. No mastoid effusion. Other: None. IMPRESSION: Normal brain MRI for age.  No acute intracranial abnormality. Electronically Signed   By: Jeannine Boga M.D.   On: 06/15/2022 21:47     Labs:   Basic Metabolic Panel: Recent Labs  Lab 06/15/22 1801 06/16/22 0450  NA 136 136  K 4.1 3.7  CL 108 107  CO2 21* 22  GLUCOSE 100* 86  BUN 17 17  CREATININE 1.35* 1.30*  CALCIUM 8.7* 8.2*  MG 2.1 2.2   GFR Estimated Creatinine Clearance: 67 mL/min (A) (by C-G formula based on SCr of 1.3 mg/dL (H)). Liver Function Tests: Recent Labs  Lab 06/15/22 1801 06/16/22 0450  AST 58* 42*  ALT 70* 56*  ALKPHOS 44 36*  BILITOT 0.7 0.6  PROT 8.0 6.6  ALBUMIN 3.9 3.1*   No results for input(s): "LIPASE", "AMYLASE" in the last 168 hours. No results for input(s): "AMMONIA" in the last 168 hours. Coagulation profile Recent Labs  Lab 06/16/22 0524  INR 1.1    CBC: Recent Labs  Lab 06/15/22 1801 06/16/22 0524  WBC 3.8*  3.5*  NEUTROABS 1.7 0.9*  HGB 15.1 13.7  HCT 45.0 40.9  MCV 85.9 86.1  PLT 141* 112*   Cardiac Enzymes: Recent Labs  Lab 06/15/22 1829 06/16/22 0450  CKTOTAL 930* 920*   BNP: Invalid input(s): "POCBNP" CBG: No results for input(s): "GLUCAP" in the last 168 hours. D-Dimer No results for input(s): "DDIMER" in the last 72 hours. Hgb A1c No results for input(s): "HGBA1C" in the last 72 hours. Lipid Profile Recent Labs    06/16/22 0450  CHOL 160  HDL 29*  LDLCALC 99  TRIG 159*  CHOLHDL 5.5   Thyroid function studies Recent Labs    06/16/22 0504  TSH 1.710   Anemia work up No results for input(s): "VITAMINB12", "FOLATE", "FERRITIN", "TIBC", "IRON", "RETICCTPCT" in the last 72 hours. Microbiology No results found for this or any previous visit (from the past 240 hour(s)).   Discharge Instructions:   Discharge Instructions     Ambulatory referral to Neurology   Complete by: As directed    An appointment is requested in approximately: 8 weeks   Diet - low sodium heart healthy   Complete by: As directed    Discharge instructions   Complete by: As directed    ASA/plavix for 3 months then ASA alone Follow up with PCP for HgbA1C results   Increase activity slowly   Complete by: As directed       Allergies as of 06/16/2022       Reactions   Atorvastatin Other (See Comments)   myalgia   Lisinopril Cough        Medication List     STOP taking these medications    amLODipine 5 MG tablet Commonly known as: NORVASC   cephALEXin 500 MG capsule Commonly known as: KEFLEX   colchicine 0.6 MG tablet   diclofenac Sodium 1 % Gel Commonly known as: Voltaren   HYDROcodone-acetaminophen 5-325 MG tablet Commonly known as: NORCO/VICODIN   meloxicam 15 MG tablet Commonly known as: MOBIC   meloxicam 7.5 MG tablet Commonly known as: MOBIC   promethazine 25 MG tablet Commonly known as: PHENERGAN   sulfamethoxazole-trimethoprim 800-160 MG  tablet Commonly known as: BACTRIM DS  TAKE these medications    allopurinol 100 MG tablet Commonly known as: ZYLOPRIM TAKE 1 TABLET(100 MG) BY MOUTH DAILY   aspirin 81 MG chewable tablet Chew 1 tablet (81 mg total) by mouth daily. Start taking on: June 17, 2022   clopidogrel 75 MG tablet Commonly known as: PLAVIX Take 1 tablet (75 mg total) by mouth daily. Start taking on: June 17, 2022   MENS 50+ MULTI VITAMIN/MIN PO Take 1 tablet by mouth 2 (two) times a week.   pantoprazole 40 MG tablet Commonly known as: PROTONIX Take 1 tablet (40 mg total) by mouth daily. What changed:  when to take this reasons to take this   rosuvastatin 20 MG tablet Commonly known as: CRESTOR Take 1 tablet (20 mg total) by mouth daily. Start taking on: June 17, 2022   valsartan 320 MG tablet Commonly known as: DIOVAN Take 320 mg by mouth daily.        Follow-up Information     Garvin Fila, MD. Schedule an appointment as soon as possible for a visit in 2 month(s).   Specialties: Neurology, Radiology Contact information: [address] [place] [address] [phone]                  Time coordinating discharge: 45 min  Signed:  Geradine Girt DO  [facility] 06/16/2022, 6:48 PM

## 2022-06-16 NOTE — Progress Notes (Signed)
PT Cancellation Note  Patient Details Name: [name] MRN: [id number] DOB: 05-21-1956   Cancelled Treatment:     Pt in [facility] ED, was triaged out due to being transferred to [facility]. Will assess pt once at [facility].    Trinidad Petron, [facility] 06/16/2022, 4:48 PM Gatha Mayer, PT, MPT Acute Rehabilitation Services Office: [phone] If a weekend: [facility] w/e pager [phone] 06/16/2022

## 2022-06-16 NOTE — Progress Notes (Signed)
PROGRESS NOTE    [name]  OMV:[id number] DOB: 1955/12/19 DOA: 06/15/2022 PCP: Mancel Bale, PA-C    Brief Narrative:  [name] is a 66 y.o. male with medical history significant for essential hypertension, hyperlipidemia, chronic mild elevation of CPK, who is admitted to [facility] on 06/15/2022 for further TIA work-up after presenting from home to [facility] ED complaining of intermittent left-sided numbness.    Assessment and Plan:  #) TIA's: Intermittent episodes of transient/self-limited left-sided numbness, occasionally associated with responding weakness, over the course of the last month, with most recent episode occurred around 2300 on 06/15/2022 before completely resolving in spontaneous fashion within a few minutes of onset, without any evidence of residual acute focal neurologic deficits at this time, with MRI brain showing no evidence of acute process, including no evidence of acute infarct. CTA H/N showed no evidence of emergent large vessel occlusion and was notable for segment of severe stenosis invovling the right P2 segment, without thrombus, aneurysm, or dissection. Neurology consult:  Frequent neurochecks Telemetry Load with aspirin and Plavix and continue aspirin 81 and Plavix 75 for 3 months.  After 3 months, aspirin only. High intensity statin-atorvastatin 80 now and daily 2D echo A1c Lipid panel PT/OT/Speech therapy As he did have symptoms yesterday, allow for permissive hypertension for another day or so and then start normalizing blood pressure with a goal blood pressure of normotension on discharge.  Avoid hypotension.      Chronically elevated CPK level: Dating back to June 2020, has further modified above, with presenting CPK of 930 noted to be slightly out of his chronic baseline, all in the setting of patient's reported chronic diffuse myalgias without any noted recent trauma, and in the absence of recent statin use, as confirmed by patient.        Transaminitis: Mildly elevated AST/ALT without laboratory evidence of responding cholestatic process.  Concomitant with mildly elevated CPK level relative to chronic baseline range, as above.  Of note, CBC reflects mild leukopenia and mild thrombocytopenia.  -outpatient work up     Essential Hypertension: documented h/o such, with outpatient antihypertensive regimen including amlodipine, chlorthalidone, valsartan. - per neurology: As he did have symptoms yesterday, allow for permissive hypertension for another day or so and then start normalizing blood pressure with a goal blood pressure of normotension on discharge.  Avoid hypotension.    Hyperlipidemia: documented h/o such.  Not on any antilipid medications at this time, including no statin in the setting of chronic myalgias and chronically elevated CPK level, as above.   Check lipid panel as component of TIA work-up. LDL: 99- defer to neurology treatment        GERD: documented h/o such; on Protonix as outpatient.  -continue home PPI.    Obesity Estimated body mass index is 33.53 kg/m as calculated from the following:   Height as of this encounter: '5\' 9"'$  (1.753 m).   Weight as of this encounter: 103 kg.     DVT prophylaxis: SCDs Start: 06/15/22 2353    Code Status: Full Code Family Communication:   Disposition Plan:  Level of care: Telemetry Medical Status is: Observation The patient will require care spanning > 2 midnights and should be moved to inpatient because: needs neurology evaluation    Consultants:  neuro   Subjective: Tired, no chest pain, no SOB  Objective: Vitals:   06/16/22 0500 06/16/22 0600 06/16/22 0800 06/16/22 0824  BP: (!) 149/88 (!) 158/86 (!) 188/112   Pulse: (!) 56 Marland Kitchen)  57 (!) 54   Resp: '16 17 18   '$ Temp:    97.9 F (36.6 C)  TempSrc:    Oral  SpO2: 98% 97% 99%   Weight:      Height:        Intake/Output Summary (Last 24 hours) at 06/16/2022 1017 Last data filed at 06/16/2022 7824 Gross  per 24 hour  Intake --  Output 300 ml  Net -300 ml   Filed Weights   06/15/22 1742  Weight: 103 kg    Examination:   General: Appearance:    Obese male in no acute distress     Lungs:     respirations unlabored  Heart:    Bradycardic. Normal rhythm. No murmurs, rubs, or gallops.    MS:   All extremities are intact.    Neurologic:   Awake, alert       Data Reviewed: I have personally reviewed following labs and imaging studies  CBC: Recent Labs  Lab 06/15/22 1801 06/16/22 0524  WBC 3.8* 3.5*  NEUTROABS 1.7 0.9*  HGB 15.1 13.7  HCT 45.0 40.9  MCV 85.9 86.1  PLT 141* 235*   Basic Metabolic Panel: Recent Labs  Lab 06/15/22 1801 06/16/22 0450  NA 136 136  K 4.1 3.7  CL 108 107  CO2 21* 22  GLUCOSE 100* 86  BUN 17 17  CREATININE 1.35* 1.30*  CALCIUM 8.7* 8.2*  MG 2.1 2.2   GFR: Estimated Creatinine Clearance: 67 mL/min (A) (by C-G formula based on SCr of 1.3 mg/dL (H)). Liver Function Tests: Recent Labs  Lab 06/15/22 1801 06/16/22 0450  AST 58* 42*  ALT 70* 56*  ALKPHOS 44 36*  BILITOT 0.7 0.6  PROT 8.0 6.6  ALBUMIN 3.9 3.1*   No results for input(s): "LIPASE", "AMYLASE" in the last 168 hours. No results for input(s): "AMMONIA" in the last 168 hours. Coagulation Profile: Recent Labs  Lab 06/16/22 0524  INR 1.1   Cardiac Enzymes: Recent Labs  Lab 06/15/22 1829 06/16/22 0450  CKTOTAL 930* 920*   BNP (last 3 results) No results for input(s): "PROBNP" in the last 8760 hours. HbA1C: No results for input(s): "HGBA1C" in the last 72 hours. CBG: No results for input(s): "GLUCAP" in the last 168 hours. Lipid Profile: Recent Labs    06/16/22 0450  CHOL 160  HDL 29*  LDLCALC 99  TRIG 159*  CHOLHDL 5.5   Thyroid Function Tests: Recent Labs    06/16/22 0504  TSH 1.710   Anemia Panel: No results for input(s): "VITAMINB12", "FOLATE", "FERRITIN", "TIBC", "IRON", "RETICCTPCT" in the last 72 hours. Sepsis Labs: No results for  input(s): "PROCALCITON", "LATICACIDVEN" in the last 168 hours.  No results found for this or any previous visit (from the past 240 hour(s)).       Radiology Studies: CT ANGIO HEAD NECK W WO CM  Result Date: 06/15/2022 CLINICAL DATA:  Initial evaluation for left-sided numbness. EXAM: CT ANGIOGRAPHY HEAD AND NECK TECHNIQUE: Multidetector CT imaging of the head and neck was performed using the standard protocol during bolus administration of intravenous contrast. Multiplanar CT image reconstructions and MIPs were obtained to evaluate the vascular anatomy. Carotid stenosis measurements (when applicable) are obtained utilizing NASCET criteria, using the distal internal carotid diameter as the denominator. RADIATION DOSE REDUCTION: This exam was performed according to the departmental dose-optimization program which includes automated exposure control, adjustment of the mA and/or kV according to patient size and/or use of iterative reconstruction technique. CONTRAST:  70m OMNIPAQUE  IOHEXOL 350 MG/ML SOLN COMPARISON:  Prior brain MRI from earlier the same day. FINDINGS: CT HEAD FINDINGS Brain: Cerebral volume within normal limits for patient age. No evidence for acute intracranial hemorrhage. No findings to suggest acute large vessel territory infarct. No mass lesion, midline shift, or mass effect. Ventricles are normal in size without evidence for hydrocephalus. No extra-axial fluid collection identified. Vascular: No hyperdense vessel identified. Skull: Scalp soft tissues demonstrate no acute abnormality. Calvarium intact. Sinuses/Orbits: Globes and orbital soft tissues within normal limits. Visualized paranasal sinuses are clear. No mastoid effusion. CTA NECK FINDINGS Aortic arch: Visualized aortic arch normal in caliber with normal branch pattern. Mild atheromatous change about the arch itself. No significant stenosis about the origin of the great vessels. Right carotid system: Right common and internal  carotid arteries widely patent without stenosis, dissection or occlusion. Left carotid system: Left common and internal carotid arteries widely patent without stenosis, dissection or occlusion. Vertebral arteries: Both vertebral arteries arise from the subclavian arteries. No proximal subclavian artery stenosis. Both vertebral arteries widely patent without stenosis, dissection or occlusion. Skeleton: No discrete or worrisome osseous lesions. Moderate cervical spondylosis noted. Other neck: No other acute soft tissue abnormality within the neck. Upper chest: Visualized upper chest demonstrates no acute finding. Review of the MIP images confirms the above findings CTA HEAD FINDINGS Anterior circulation: Petrous segments patent bilaterally. Mild for age atheromatous change within the carotid siphons without significant stenosis. A1 segments patent bilaterally. Normal anterior communicating artery complex. Anterior cerebral arteries patent without stenosis. No M1 stenosis or occlusion. No proximal MCA branch occlusion. Distal MCA branches perfused and symmetric. Posterior circulation: Both V4 segments widely patent. Neither PICA origin well visualized. Basilar widely patent to its distal aspect without stenosis. Superior cerebral arteries patent bilaterally. Both PCAs primarily supplied via the basilar. Left PCA widely patent to its distal aspect. There is a severe somewhat long segment stenosis involving the right P2 segment (series 12, image 59). This measures approximately 9 mm in length. Right PCA otherwise patent to its distal aspect. Venous sinuses: Patent allowing for timing the contrast bolus. Anatomic variants: None significant.  No aneurysm. Review of the MIP images confirms the above findings IMPRESSION: 1. Negative CTA for emergent large vessel occlusion. 2. 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 Finding could potentially contribute to left-sided numbness. 3. Otherwise wide patency of  the major arterial vasculature of the head and neck. Mild atheromatous disease for age, with no other hemodynamically significant or correctable stenosis. Electronically Signed   By: Jeannine Boga M.D.   On: 06/15/2022 23:44   MR Brain Wo Contrast (neuro protocol)  Result Date: 06/15/2022 CLINICAL DATA:  Initial evaluation for acute TIA. EXAM: MRI HEAD WITHOUT CONTRAST TECHNIQUE: Multiplanar, multiecho pulse sequences of the brain and surrounding structures were obtained without intravenous contrast. COMPARISON:  CT from 12/27/2015. FINDINGS: Brain: Cerebral volume within normal limits. Minimal scattered T2/FLAIR hyperintensity noted involving the supratentorial cerebral white matter, nonspecific, but most commonly related to chronic microvascular ischemic disease. Overall, changes are minimal in nature, and felt to be within normal limits for age. No evidence for acute or subacute ischemia. Gray-white matter differentiation maintained. No areas of chronic cortical infarction. No acute or chronic intracranial blood products. No mass lesion, midline shift or mass effect. No hydrocephalus or extra-axial fluid collection. Pituitary gland and suprasellar region normal. Midline structures intact. Vascular: Major intracranial vascular flow voids are maintained. Asymmetric FLAIR hyperintensity involving the right transverse sinus most likely related to  slow/sluggish flow. Skull and upper cervical spine: Craniocervical junction within normal limits. Bone marrow signal intensity overall felt to be within normal limits. No scalp soft tissue abnormality. Sinuses/Orbits: Globes orbital soft tissues demonstrate no acute finding. Scattered mucosal thickening noted about the ethmoidal air cells and maxillary sinuses. Paranasal sinuses are otherwise clear. No mastoid effusion. Other: None. IMPRESSION: Normal brain MRI for age.  No acute intracranial abnormality. Electronically Signed   By: Jeannine Boga M.D.   On:  06/15/2022 21:47        Scheduled Meds:  aspirin  81 mg Oral Daily   clopidogrel  75 mg Oral Daily   pantoprazole  40 mg Oral Daily   rosuvastatin  20 mg Oral Daily   Continuous Infusions:  lactated ringers 100 mL/hr at 06/16/22 0124     LOS: 0 days    Time spent: 45 minutes spent on chart review, discussion with nursing staff, consultants, updating family and interview/physical exam; more than 50% of that time was spent in counseling and/or coordination of care.    Geradine Girt, DO [facility] Available via Epic secure chat 7am-7pm After these hours, please refer to coverage provider listed on [email or web address] 06/16/2022, 10:17 AM

## 2022-06-16 NOTE — ED Notes (Signed)
Pt given a [place] sandwich, saltine crackers, and ice water

## 2022-06-24 ENCOUNTER — Encounter: Payer: Self-pay | Admitting: Neurology

## 2022-06-24 ENCOUNTER — Ambulatory Visit: Payer: Medicare Other | Admitting: Neurology

## 2022-06-24 VITALS — BP 149/97 | HR 79 | Ht 69.0 in | Wt 226.0 lb

## 2022-06-24 DIAGNOSIS — I6521 Occlusion and stenosis of right carotid artery: Secondary | ICD-10-CM

## 2022-06-24 DIAGNOSIS — G459 Transient cerebral ischemic attack, unspecified: Secondary | ICD-10-CM | POA: Diagnosis not present

## 2022-06-24 DIAGNOSIS — R202 Paresthesia of skin: Secondary | ICD-10-CM

## 2022-06-24 DIAGNOSIS — G466 Pure sensory lacunar syndrome: Secondary | ICD-10-CM | POA: Diagnosis not present

## 2022-06-24 NOTE — Patient Instructions (Signed)
I had a long d/w patient and his lady friend about his recent episodes off cheiro oral paresthesias and TIA, intracranial stenosis, risk for recurrent stroke/TIAs, personally independently reviewed imaging studies and stroke evaluation results and answered questions.Continue aspirin 81 mg daily and clopidogrel 75 mg daily for 3 months followed by aspirin 81 mg daily alone for secondary stroke prevention and maintain strict control of hypertension with blood pressure goal below 130/90, diabetes with hemoglobin A1c goal below 6.5% and lipids with LDL cholesterol goal below 70 mg/dL. I also advised the patient to eat a healthy diet with plenty of whole grains, cereals, fruits and vegetables, exercise regularly and maintain ideal body weight patient plans to move to [facility] next week and advised him to see a primary care physician in the area for further follow-up.  Followup in the future with me only if needed. Stroke Prevention Some medical conditions and behaviors can lead to a higher chance of having a stroke. You can help prevent a stroke by eating healthy, exercising, not smoking, and managing any medical conditions you have. Stroke is a leading cause of functional impairment. Primary prevention is particularly important because a majority of strokes are first-time events. Stroke changes the lives of not only those who experience a stroke but also their family and other caregivers. How can this condition affect me? A stroke is a medical emergency and should be treated right away. A stroke can lead to brain damage and can sometimes be life-threatening. If a person gets medical treatment right away, there is a better chance of surviving and recovering from a stroke. What can increase my risk? The following medical conditions may increase your risk of a stroke: Cardiovascular disease. High blood pressure (hypertension). Diabetes. High cholesterol. Sickle cell disease. Blood clotting disorders  (hypercoagulable state). Obesity. Sleep disorders (obstructive sleep apnea). Other risk factors include: Being older than age 64. Having a history of blood clots, stroke, or mini-stroke (transient ischemic attack, TIA). Genetic factors, such as race, ethnicity, or a family history of stroke. Smoking cigarettes or using other tobacco products. Taking birth control pills, especially if you also use tobacco. Heavy use of alcohol or drugs, especially cocaine and methamphetamine. Physical inactivity. What actions can I take to prevent this? Manage your health conditions High cholesterol levels. Eating a healthy diet is important for preventing high cholesterol. If cholesterol cannot be managed through diet alone, you may need to take medicines. Take any prescribed medicines to control your cholesterol as told by your health care provider. Hypertension. To reduce your risk of stroke, try to keep your blood pressure below 130/80. Eating a healthy diet and exercising regularly are important for controlling blood pressure. If these steps are not enough to manage your blood pressure, you may need to take medicines. Take any prescribed medicines to control hypertension as told by your health care provider. Ask your health care provider if you should monitor your blood pressure at home. Have your blood pressure checked every year, even if your blood pressure is normal. Blood pressure increases with age and some medical conditions. Diabetes. Eating a healthy diet and exercising regularly are important parts of managing your blood sugar (glucose). If your blood sugar cannot be managed through diet and exercise, you may need to take medicines. Take any prescribed medicines to control your diabetes as told by your health care provider. Get evaluated for obstructive sleep apnea. Talk to your health care provider about getting a sleep evaluation if you snore a lot or  have excessive sleepiness. Make sure that  any other medical conditions you have, such as atrial fibrillation or atherosclerosis, are managed. Nutrition Follow instructions from your health care provider about what to eat or drink to help manage your health condition. These instructions may include: Reducing your daily calorie intake. Limiting how much salt (sodium) you use to 1,500 milligrams (mg) each day. Using only healthy fats for cooking, such as olive oil, canola oil, or sunflower oil. Eating healthy foods. You can do this by: Choosing foods that are high in fiber, such as whole grains, and fresh fruits and vegetables. Eating at least 5 servings of fruits and vegetables a day. Try to fill one-half of your plate with fruits and vegetables at each meal. Choosing lean protein foods, such as lean cuts of meat, poultry without skin, fish, tofu, beans, and nuts. Eating low-fat dairy products. Avoiding foods that are high in sodium. This can help lower blood pressure. Avoiding foods that have saturated fat, trans fat, and cholesterol. This can help prevent high cholesterol. Avoiding processed and prepared foods. Counting your daily carbohydrate intake.  Lifestyle If you drink alcohol: Limit how much you have to: 0-1 drink a day for women who are not pregnant. 0-2 drinks a day for men. Know how much alcohol is in your drink. In the [place] one drink equals one 12 oz bottle of beer (332m), one 5 oz glass of wine (1435m, or one 1 oz glass of hard liquor (4480m Do not use any products that contain nicotine or tobacco. These products include cigarettes, chewing tobacco, and vaping devices, such as e-cigarettes. If you need help quitting, ask your health care provider. Avoid secondhand smoke. Do not use drugs. Activity  Try to stay at a healthy weight. Get at least 30 minutes of exercise on most days, such as: Fast walking. Biking. Swimming. Medicines Take over-the-counter and prescription medicines only as told by your health  care provider. Aspirin or blood thinners (antiplatelets or anticoagulants) may be recommended to reduce your risk of forming blood clots that can lead to stroke. Avoid taking birth control pills. Talk to your health care provider about the risks of taking birth control pills if: You are over 35 39ars old. You smoke. You get very bad headaches. You have had a blood clot. Where to find more information American Stroke Association: www.strokeassociation.org Get help right away if: You or a loved one has any symptoms of a stroke. "BE FAST" is an easy way to remember the main warning signs of a stroke: B - Balance. Signs are dizziness, sudden trouble walking, or loss of balance. E - Eyes. Signs are trouble seeing or a sudden change in vision. F - Face. Signs are sudden weakness or numbness of the face, or the face or eyelid drooping on one side. A - Arms. Signs are weakness or numbness in an arm. This happens suddenly and usually on one side of the body. S - Speech. Signs are sudden trouble speaking, slurred speech, or trouble understanding what people say. T - Time. Time to call emergency services. Write down what time symptoms started. You or a loved one has other signs of a stroke, such as: A sudden, severe headache with no known cause. Nausea or vomiting. Seizure. These symptoms may represent a serious problem that is an emergency. Do not wait to see if the symptoms will go away. Get medical help right away. Call your local emergency services (911 in the [place] Do not drive yourself to the  hospital. Summary You can help to prevent a stroke by eating healthy, exercising, not smoking, limiting alcohol intake, and managing any medical conditions you may have. Do not use any products that contain nicotine or tobacco. These include cigarettes, chewing tobacco, and vaping devices, such as e-cigarettes. If you need help quitting, ask your health care provider. Remember "BE FAST" for warning signs of  a stroke. Get help right away if you or a loved one has any of these signs. This information is not intended to replace advice given to you by your health care provider. Make sure you discuss any questions you have with your health care provider. Document Revised: 04/10/2020 Document Reviewed: 04/10/2020 Elsevier Patient Education  [place].

## 2022-06-24 NOTE — Progress Notes (Signed)
[facility] [address] [place]. [place] [address] [phone]       OFFICE FOLLOW-UP NOTE  Mr. CHRIS CRIPPS Date of Birth:  1956/03/18 Medical Record Number:  [id number]   HPI: [name] is a pleasant 66 year old African-American male seen today for initial office follow-up visit following hospital admission for TIA.  He is accompanied by a lady friend today.  History is obtained from the patient and review of electronic medical records and I personally reviewed available pertinent imaging films in PACS. He has a past medical history of hypertension, hyperlipidemia, gout, presented to emergency room on 06/15/2022 with complaints of intermittent left-sided numbness.  He reports that over the past few weeks, he has been exhibiting episodes of numbness and heaviness to involve his left face arm and leg as well as left.  He states that they lasted for few minutes-multiple times during the day.  They have become more frequent over the last few days, which made him come to the ER MRI of the brain was done which was unremarkable.   CT angiography head and neck was significant only for a long  P2 segment severe stenosis.  He was admitted for TIA work-up given that the left-sided symptoms might be due to the stenotic symptomatic posterior circulation.  He was started on aspirin and Plavix for 3 months followed by aspirin alone.  LDL cholesterol 99 mg percent.  Hemoglobin A1c was 5.5.  2D echo showed ejection fraction of 50 to 55% without cardiac source embolism.  Urine drug screen was negative.  Ethanol level is nondetectable.  Patient states is done well since discharge.  This episode is much improved he still has some light feeling that this may be happening but does not stay long.  He saw his primary care physician who added Norvasc for the last 1 week and since then has been complaining of dizziness and lightheadedness particularly i when he gets up quickly or bends his head down or  after for several minutes.  Patient is planning to move to [facility] next week.  He denies any prior history of strokes or TIAs prior to these episodes.  No prior history of strokes or TIAs either.  He does not smoke or drink.  ROS:   14 system review of systems is positive for numbness, tingling, dizziness, lightheadedness and all other systems negative  PMH:  Past Medical History:  Diagnosis Date   Epididymitis    GERD (gastroesophageal reflux disease)    Gout of big toe 05/21/2013   Hard of hearing    History of gout    Hx of adenomatous polyp of colon 02/01/2017   Hyperlipidemia    Hypertension    Orchitis    Sepsis ([place]) hospitalized 12/25/2015   Urethral stricture     Social History:  Social History   Socioeconomic History   Marital status: Legally Separated    Spouse name: Not on file   Number of children: 3   Years of education: Not on file   Highest education level: Not on file  Occupational History   Occupation: driver  Tobacco Use   Smoking status: Never   Smokeless tobacco: Never  Vaping Use   Vaping Use: Never used  Substance and Sexual Activity   Alcohol use: Yes    Alcohol/week: 6.0 standard drinks of alcohol    Types: 6 Glasses of wine per week   Drug use: No   Sexual activity: Not Currently  Other Topics Concern  Not on file  Social History Narrative   Marital status is separated   Employed as a delivery driver   One son to daughters born in the 1980s, he has one grandchild and 1 great grandchild   12/25/2016   Right handed   One story home   No caffeine   Social Determinants of Health   Financial Resource Strain: Not on file  Food Insecurity: No Food Insecurity (06/16/2022)   Hunger Vital Sign    Worried About Running Out of Food in the Last Year: Never true    Ran Out of Food in the Last Year: Never true  Transportation Needs: No Transportation Needs (06/16/2022)   PRAPARE - Hydrologist (Medical): No    Lack  of Transportation (Non-Medical): No  Physical Activity: Not on file  Stress: Not on file  Social Connections: Not on file  Intimate Partner Violence: Not At Risk (06/16/2022)   Humiliation, Afraid, Rape, and Kick questionnaire    Fear of Current or Ex-Partner: No    Emotionally Abused: No    Physically Abused: No    Sexually Abused: No    Medications:   Current Outpatient Medications on File Prior to Visit  Medication Sig Dispense Refill   allopurinol (ZYLOPRIM) 100 MG tablet TAKE 1 TABLET(100 MG) BY MOUTH DAILY 90 tablet 0   aspirin 81 MG chewable tablet Chew 1 tablet (81 mg total) by mouth daily.     clopidogrel (PLAVIX) 75 MG tablet Take 1 tablet (75 mg total) by mouth daily. 90 tablet 0   Multiple Vitamins-Minerals (MENS 50+ MULTI VITAMIN/MIN PO) Take 1 tablet by mouth 2 (two) times a week.     pantoprazole (PROTONIX) 40 MG tablet Take 1 tablet (40 mg total) by mouth daily. (Patient taking differently: Take 40 mg by mouth daily as needed (heartburn).) 90 tablet 1   rosuvastatin (CRESTOR) 20 MG tablet Take 1 tablet (20 mg total) by mouth daily. 30 tablet 0   valsartan (DIOVAN) 320 MG tablet Take 320 mg by mouth daily.     No current facility-administered medications on file prior to visit.    Allergies:   Allergies  Allergen Reactions   Atorvastatin Other (See Comments)    myalgia   Lisinopril Cough    Physical Exam General: Mildly obese middle-aged African-American male, seated, in no evident distress Head: head normocephalic and atraumatic.  Neck: supple with no carotid or supraclavicular bruits Cardiovascular: regular rate and rhythm, no murmurs Musculoskeletal: no deformity Skin:  no rash/petichiae Vascular:  Normal pulses all extremities Vitals:   06/24/22 1424  BP: (!) 149/97  Pulse: 79   Neurologic Exam Mental Status: Awake and fully alert. Oriented to place and time. Recent and remote memory intact. Attention span, concentration and fund of knowledge  appropriate. Mood and affect appropriate.  Cranial Nerves: Fundoscopic exam reveals sharp disc margins. Pupils equal, briskly reactive to light. Extraocular movements full without nystagmus. Visual fields full to confrontation. Hearing intact. Facial sensation intact. Face, tongue, palate moves normally and symmetrically.  Motor: Normal bulk and tone. Normal strength in all tested extremity muscles. Sensory.: intact to touch ,pinprick .position and vibratory sensation.  Coordination: Rapid alternating movements normal in all extremities. Finger-to-nose and heel-to-shin performed accurately bilaterally. Gait and Station: Arises from chair without difficulty. Stance is normal. Gait demonstrates normal stride length and balance . Able to heel, toe and tandem walk without difficulty.  Reflexes: 1+ and symmetric. Toes downgoing.   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  Vascular risk factors of hypertension ,hyperlipidemia, intracranial stenosis and mild obesity.     PLAN: I had a long d/w patient and his lady friend about his recent episodes off cheiro oral paresthesias and TIA, intracranial stenosis, risk for recurrent stroke/TIAs, personally independently reviewed imaging studies and stroke evaluation results and answered questions.Continue aspirin 81 mg daily and clopidogrel 75 mg daily for 3 months followed by aspirin 81 mg daily alone for secondary stroke prevention and maintain strict control of hypertension with blood pressure goal below 130/90, diabetes with hemoglobin A1c goal below 6.5% and lipids with LDL cholesterol goal below 70 mg/dL. I also advised the patient to eat a healthy diet with plenty of whole grains, cereals, fruits and vegetables, exercise regularly and maintain ideal body weight patient plans to move to [facility] next week and  advised him to see a primary care physician in the area for further follow-up.  Patient is complaining of some dizziness and orthostasis which may be related to starting Norvasc.  I recommend he reduce or stop that medication and seek alternative medication after discussion with her family physician followup in the future with me only if needed. Greater than 50% of time during this 35 minute visit was spent on counseling,explanation of diagnosis, planning of further management, discussion with patient and family and coordination of care Antony Contras, MD Note: This document was prepared with digital dictation and possible smart phrase technology. Any transcriptional errors that result from this process are unintentional

## 2022-11-26 ENCOUNTER — Telehealth: Payer: Self-pay

## 2022-11-26 NOTE — Telephone Encounter (Signed)
Tried calling patient with no answer. Left voice mail for return call
